# Patient Record
Sex: Male | Born: 1990 | Race: Black or African American | Hispanic: No | Marital: Married | Smoking: Current every day smoker
Health system: Southern US, Community
[De-identification: ages and names within clinical notes are randomized; demographics above are authoritative.]

## PROBLEM LIST (undated history)

## (undated) DIAGNOSIS — F319 Bipolar disorder, unspecified: Secondary | ICD-10-CM

## (undated) DIAGNOSIS — F209 Schizophrenia, unspecified: Secondary | ICD-10-CM

## (undated) DIAGNOSIS — F419 Anxiety disorder, unspecified: Secondary | ICD-10-CM

---

## 2014-06-25 ENCOUNTER — Emergency Department (HOSPITAL_COMMUNITY)
Admission: EM | Admit: 2014-06-25 | Discharge: 2014-06-25 | Disposition: A | Discharge status: Home / Self Care | Payer: Self-pay | Attending: Emergency Medicine | Admitting: Emergency Medicine

## 2014-06-25 ENCOUNTER — Encounter (HOSPITAL_COMMUNITY): Payer: Self-pay | Admitting: Emergency Medicine

## 2014-06-25 DIAGNOSIS — Z720 Tobacco use: Secondary | ICD-10-CM | POA: Insufficient documentation

## 2014-06-25 DIAGNOSIS — Y9289 Other specified places as the place of occurrence of the external cause: Secondary | ICD-10-CM | POA: Insufficient documentation

## 2014-06-25 DIAGNOSIS — Z880 Allergy status to penicillin: Secondary | ICD-10-CM | POA: Insufficient documentation

## 2014-06-25 DIAGNOSIS — Y990 Civilian activity done for income or pay: Secondary | ICD-10-CM | POA: Insufficient documentation

## 2014-06-25 DIAGNOSIS — Z79899 Other long term (current) drug therapy: Secondary | ICD-10-CM | POA: Insufficient documentation

## 2014-06-25 DIAGNOSIS — Y9389 Activity, other specified: Secondary | ICD-10-CM | POA: Insufficient documentation

## 2014-06-25 DIAGNOSIS — Z8659 Personal history of other mental and behavioral disorders: Secondary | ICD-10-CM | POA: Insufficient documentation

## 2014-06-25 DIAGNOSIS — X58XXXA Exposure to other specified factors, initial encounter: Secondary | ICD-10-CM | POA: Insufficient documentation

## 2014-06-25 DIAGNOSIS — S299XXA Unspecified injury of thorax, initial encounter: Secondary | ICD-10-CM | POA: Insufficient documentation

## 2014-06-25 DIAGNOSIS — M546 Pain in thoracic spine: Secondary | ICD-10-CM

## 2014-06-25 HISTORY — DX: Bipolar disorder, unspecified: F31.9

## 2014-06-25 HISTORY — DX: Anxiety disorder, unspecified: F41.9

## 2014-06-25 HISTORY — DX: Schizophrenia, unspecified: F20.9

## 2014-06-25 MED ORDER — HYDROCODONE-ACETAMINOPHEN 5-325 MG PO TABS: 1.0000 | ORAL_TABLET | ORAL | Status: DC | PRN

## 2014-06-25 MED ORDER — KETOROLAC TROMETHAMINE 60 MG/2ML IM SOLN
60.0000 mg | Freq: Once | INTRAMUSCULAR | Status: AC
  Administered 2014-06-25: 60 mg via INTRAMUSCULAR
  Filled 2014-06-25: qty 2

## 2014-06-25 MED ORDER — CYCLOBENZAPRINE HCL 10 MG PO TABS: 10.0000 mg | ORAL_TABLET | Freq: Two times a day (BID) | ORAL | Status: DC | PRN

## 2014-06-25 MED ORDER — PREDNISONE 10 MG PO TABS: 20.0000 mg | ORAL_TABLET | Freq: Every day | ORAL | Status: DC

## 2014-06-25 MED ORDER — CYCLOBENZAPRINE HCL 10 MG PO TABS
5.0000 mg | ORAL_TABLET | Freq: Once | ORAL | Status: AC
  Administered 2014-06-25: 5 mg via ORAL
  Filled 2014-06-25: qty 1

## 2014-06-25 MED ORDER — OXYCODONE-ACETAMINOPHEN 5-325 MG PO TABS
1.0000 | ORAL_TABLET | Freq: Once | ORAL | Status: AC
  Administered 2014-06-25: 1 via ORAL
  Filled 2014-06-25: qty 1

## 2014-06-25 NOTE — ED Notes (Signed)
Pt sts that he has muscle pain in his upper/middle back. Pain increases with movement and deep breathing. Pain is eased with pillow under his back. 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 No active distress in triage.

## 2014-06-25 NOTE — ED Provider Notes (Signed)
CSN: [id number]     Arrival date & time 06/25/14  1412 History  This chart was scribed for non-physician practitioner Marlon Pel, PA-C working with Tomasita Crumble, MD by Murriel Hopper, ED Scribe. This patient was seen in room WTR5/WTR5 and the patient's care was started at 4:10 PM.    Chief Complaint  Patient presents with  . Back Pain      The history is provided by the patient. No language interpreter was used.     HPI Comments: Howard Carlson is a 24 y.o. male who presents to the Emergency Department complaining of constant, worsening mid-thoracic back pain that has been present for 4.5 days. Pt states he injured his back moving heavy car parts at his job. Pt reports pain worsens with movement, reports difficulty getting out of bed, pain with bending over. Pt reports taking 800 mg Ibuprofen and trying a Percocet with no relief four days ago, reports using topical gels, heat and ice compress, and stretching with little relief. Denies any numbness or weakness to any of his 4 extremities. No chest pain, abdominal pain, SOB, or weakness.   Past Medical History  Diagnosis Date  . Anxiety   . Bipolar affective   . Schizophrenic disorder    History reviewed. No pertinent past surgical history. History reviewed. No pertinent family history. History  Substance Use Topics  . Smoking status: Light Tobacco Smoker    Types: Cigarettes  . Smokeless tobacco: Never Used  . Alcohol Use: No    Review of Systems  Musculoskeletal: Positive for myalgias and back pain.      Allergies  Amoxicillin  Home Medications   Prior to Admission medications   Medication Sig Start Date End Date Taking? Authorizing Provider  ibuprofen (ADVIL,MOTRIN) 200 MG tablet Take 400 mg by mouth every 6 (six) hours as needed for headache or moderate pain.   Yes Historical Provider, MD  oxycodone (OXY-IR) 5 MG capsule Take 5 mg by mouth once.   Yes Historical Provider, MD  cyclobenzaprine (FLEXERIL) 10 MG  tablet Take 1 tablet (10 mg total) by mouth 2 (two) times daily as needed for muscle spasms. 06/25/14   Marlon Pel, PA-C  HYDROcodone-acetaminophen (NORCO/VICODIN) 5-325 MG per tablet Take 1-2 tablets by mouth every 4 (four) hours as needed. 06/25/14   Shelia Magallon Neva Seat, PA-C  predniSONE (DELTASONE) 10 MG tablet Take 2 tablets (20 mg total) by mouth daily. 06/25/14   Zavannah Deblois Neva Seat, PA-C   BP 116/63 mmHg  Pulse 59  Temp(Src) 98.4 F (36.9 C) (Oral)  Resp 20  SpO2 97% Physical Exam  Constitutional: He is oriented to person, place, and time. He appears well-developed and well-nourished.  HENT:  Head: Normocephalic and atraumatic.  Cardiovascular: Normal rate.   Pulmonary/Chest: Effort normal.  Abdominal: He exhibits no distension.  Musculoskeletal:       Back:  Neurological: He is alert and oriented to person, place, and time.  Skin: Skin is warm and dry.  Psychiatric: He has a normal mood and affect.  Nursing note and vitals reviewed.   ED Course  Procedures (including critical care time)  DIAGNOSTIC STUDIES: Oxygen Saturation is 97% on room air, normal by my interpretation.    COORDINATION OF CARE: 4:15 PM Discussed treatment plan with pt at bedside and pt agreed to plan.   Labs Review Labs Reviewed - No data to display  Imaging Review No results found.   EKG Interpretation None      MDM   Final diagnoses:  Bilateral  thoracic back pain   Medications  cyclobenzaprine (FLEXERIL) tablet 5 mg (5 mg Oral Given 06/25/14 1639)  ketorolac (TORADOL) injection 60 mg (60 mg Intramuscular Given 06/25/14 1639)  oxyCODONE-acetaminophen (PERCOCET/ROXICET) 5-325 MG per tablet 1 tablet (1 tablet Oral Given 06/25/14 1639)    23 y.o.Howard Carlson's  with back pain. No neurological deficits and normal neuro exam. Patient can walk. No loss of bowel or bladder control. No concern for cauda equina at this time base on HPI and physical exam findings. No fever, night sweats, weight loss,  h/o cancer, IVDU.   Patient Plan 1. Medications: NSAIDs and muscle relaxer. Cont usual home medications unless otherwise directed. 2. Treatment: rest, drink plenty of fluids, gentle stretching as discussed, alternate ice and heat  3. Follow Up: Please followup with your primary doctor for discussion of your diagnoses and further evaluation after today's visit; if you do not have a primary care doctor use the resource guide provided to find one  Advised to follow-up with the orthopedist if symptoms do not start to resolve in the next 2-3 days. If develop loss of bowel or urinary control return to the ED as soon as possible for further evaluation. To take the medications as prescribed as they can cause harm if not taken appropriately.   Vital signs are stable at discharge. Filed Vitals:   06/25/14 1425  BP: 116/63  Pulse: 59  Temp: 98.4 F (36.9 C)  Resp: 20    Patient/guardian has voiced understanding and agreed to follow-up with the PCP or specialist.       I personally performed the services described in this documentation, which was scribed in my presence. The recorded information has been reviewed and is accurate.   Marlon Peliffany Gaige Sebo, PA-C 06/25/14 1640  Tomasita CrumbleAdeleke Oni, MD 06/26/14 2255

## 2014-06-25 NOTE — Discharge Instructions (Signed)
Back Pain, Adult °Low back pain is very common. About 1 in 5 people have back pain. The cause of low back pain is rarely dangerous. The pain often gets better over time. About half of people with a sudden onset of back pain feel better in just 2 weeks. About 8 in 10 people feel better by 6 weeks.  °CAUSES °Some common causes of back pain include: °· Strain of the muscles or ligaments supporting the spine. °· Wear and tear (degeneration) of the spinal discs. °· Arthritis. °· Direct injury to the back. °DIAGNOSIS °Most of the time, the direct cause of low back pain is not known. However, back pain can be treated effectively even when the exact cause of the pain is unknown. Answering your caregiver's questions about your overall health and symptoms is one of the most accurate ways to make sure the cause of your pain is not dangerous. If your caregiver needs more information, he or she may order lab work or imaging tests (X-rays or MRIs). However, even if imaging tests show changes in your back, this usually does not require surgery. °HOME CARE INSTRUCTIONS °For many people, back pain returns. Since low back pain is rarely dangerous, it is often a condition that people can learn to manage on their own.  °· Remain active. It is stressful on the back to sit or stand in one place. Do not sit, drive, or stand in one place for more than 30 minutes at a time. Take short walks on level surfaces as soon as pain allows. Try to increase the length of time you walk each day. °· Do not stay in bed. Resting more than 1 or 2 days can delay your recovery. °· Do not avoid exercise or work. Your body is made to move. It is not dangerous to be active, even though your back may hurt. Your back will likely heal faster if you return to being active before your pain is gone. °· Pay attention to your body when you  bend and lift. Many people have less discomfort when lifting if they bend their knees, keep the load close to their bodies, and  avoid twisting. Often, the most comfortable positions are those that put less stress on your recovering back. °· Find a comfortable position to sleep. Use a firm mattress and lie on your side with your knees slightly bent. If you lie on your back, put a pillow under your knees. °· Only take over-the-counter or prescription medicines as directed by your caregiver. Over-the-counter medicines to reduce pain and inflammation are often the most helpful. Your caregiver may prescribe muscle relaxant drugs. These medicines help dull your pain so you can more quickly return to your normal activities and healthy exercise. °· Put ice on the injured area. °¨ Put ice in a plastic bag. °¨ Place a towel between your skin and the bag. °¨ Leave the ice on for 15-20 minutes, 03-04 times a day for the first 2 to 3 days. After that, ice and heat may be alternated to reduce pain and spasms. °· Ask your caregiver about trying back exercises and gentle massage. This may be of some benefit. °· Avoid feeling anxious or stressed. Stress increases muscle tension and can worsen back pain. It is important to recognize when you are anxious or stressed and learn ways to manage it. Exercise is a great option. °SEEK MEDICAL CARE IF: °· You have pain that is not relieved with rest or medicine. °· You have pain that does not improve in 1 week. °· You have new symptoms. °· You are generally not feeling well. °SEEK   IMMEDIATE MEDICAL CARE IF:  °· You have pain that radiates from your back into your legs. °· You develop new bowel or bladder control problems. °· You have unusual weakness or numbness in your arms or legs. °· You develop nausea or vomiting. °· You develop abdominal pain. °· You feel faint. °Document Released: 01/10/2005 Document Revised: 07/12/2011 Document Reviewed: 05/14/2013 °ExitCare® Patient Information ©2015 [employer]. This information is not intended to replace advice given to you by your health care provider. Make sure you  discuss any questions you have with your health care provider. ° °Thoracic Strain °You have injured the muscles or tendons that attach to the upper part of your back behind your chest. This injury is called a thoracic strain, thoracic sprain, or mid-back strain.  °CAUSES  °The cause of thoracic strain varies. A less severe injury involves pulling a muscle or tendon without tearing it. A more severe injury involves tearing (rupturing) a muscle or tendon. With less severe injuries, there may be little loss of strength. Sometimes, there are breaks (fractures) in the bones to which the muscles are attached. These fractures are rare, unless there was a direct hit (trauma) or you have weak bones due to osteoporosis or age. Longstanding strains may be caused by overuse or improper form during certain movements. Obesity can also increase your risk for back injuries. Sudden strains may occur due to injury or not warming up properly before exercise. Often, there is no obvious cause for a thoracic strain. °SYMPTOMS  °The main symptom is pain, especially with movement, such as during exercise. °DIAGNOSIS  °Your caregiver can usually tell what is wrong by taking an X-ray and doing a physical exam. °TREATMENT  °· Physical therapy may be helpful for recovery. Your caregiver can give you exercises to do or refer you to a physical therapist after your pain improves. °· After your pain improves, strengthening and conditioning programs appropriate for your sport or occupation may be helpful. °· Always warm up before physical activities or athletics. Stretching after physical activity may also help. °· Certain over-the-counter medicines may also help. Ask your caregiver if there are medicines that would help you. °If this is your first thoracic strain injury, proper care and proper healing time before starting activities should prevent long-term problems. Torn ligaments and tendons require as long to heal as broken bones. Average  healing times may be only 1 week for a mild strain. For torn muscles and tendons, healing time may be up to 6 weeks to 2 months. °HOME CARE INSTRUCTIONS  °· Apply ice to the injured area. Ice massages may also be used as directed. °¨ Put ice in a plastic bag. °¨ Place a towel between your skin and the bag. °¨ Leave the ice on for 15-20 minutes, 03-04 times a day, for the first 2 days. °· Only take over-the-counter or prescription medicines for pain, discomfort, or fever as directed by your caregiver. °· Keep your appointments for physical therapy if this was prescribed. °· Use wraps and back braces as instructed. °SEEK IMMEDIATE MEDICAL CARE IF:  °· You have an increase in bruising, swelling, or pain. °· Your pain has not improved with medicines. °· You develop new shortness of breath, chest pain, or fever. °· Problems seem to be getting worse rather than better. °MAKE SURE YOU:  °· Understand these instructions. °· Will watch your condition. °· Will get help right away if you are not doing well or get worse. °Document Released: 04/02/2003 Document Revised:   04/04/2011 Document Reviewed: 02/26/2010 °ExitCare® Patient Information ©2015 [employer]. This information is not intended to replace advice given to you by your health care provider. Make sure you discuss any questions you have with your health care provider. ° °

## 2014-09-21 ENCOUNTER — Encounter (HOSPITAL_COMMUNITY): Payer: Self-pay

## 2014-09-21 ENCOUNTER — Emergency Department (HOSPITAL_COMMUNITY)
Admission: EM | Admit: 2014-09-21 | Discharge: 2014-09-22 | Disposition: A | Discharge status: Home / Self Care | Payer: Self-pay | Attending: Emergency Medicine | Admitting: Emergency Medicine

## 2014-09-21 DIAGNOSIS — Z880 Allergy status to penicillin: Secondary | ICD-10-CM | POA: Insufficient documentation

## 2014-09-21 DIAGNOSIS — F419 Anxiety disorder, unspecified: Secondary | ICD-10-CM | POA: Insufficient documentation

## 2014-09-21 DIAGNOSIS — F1210 Cannabis abuse, uncomplicated: Secondary | ICD-10-CM | POA: Insufficient documentation

## 2014-09-21 DIAGNOSIS — F4321 Adjustment disorder with depressed mood: Secondary | ICD-10-CM | POA: Diagnosis present

## 2014-09-21 DIAGNOSIS — F1410 Cocaine abuse, uncomplicated: Secondary | ICD-10-CM | POA: Diagnosis present

## 2014-09-21 DIAGNOSIS — Z720 Tobacco use: Secondary | ICD-10-CM | POA: Insufficient documentation

## 2014-09-21 LAB — COMPREHENSIVE METABOLIC PANEL
ALT: 32 U/L (ref 17–63)
AST: 31 U/L (ref 15–41)
Albumin: 4.4 g/dL (ref 3.5–5.0)
Alkaline Phosphatase: 55 U/L (ref 38–126)
Anion gap: 8 (ref 5–15)
BILIRUBIN TOTAL: 0.6 mg/dL (ref 0.3–1.2)
BUN: 10 mg/dL (ref 6–20)
CALCIUM: 9.3 mg/dL (ref 8.9–10.3)
CO2: 25 mmol/L (ref 22–32)
Chloride: 103 mmol/L (ref 101–111)
Creatinine, Ser: 1.16 mg/dL (ref 0.61–1.24)
GFR calc Af Amer: >60 mL/min (ref >60)
GFR calc non Af Amer: >60 mL/min (ref >60)
GLUCOSE: 119 mg/dL — AB (ref 65–99)
Potassium: 4.1 mmol/L (ref 3.5–5.1)
Sodium: 136 mmol/L (ref 135–145)
TOTAL PROTEIN: 8 g/dL (ref 6.5–8.1)

## 2014-09-21 LAB — RAPID URINE DRUG SCREEN, HOSP PERFORMED
Amphetamines: NOT DETECTED
BARBITURATES: NOT DETECTED
BENZODIAZEPINES: NOT DETECTED
Cocaine: POSITIVE — AB
Opiates: NOT DETECTED
Tetrahydrocannabinol: POSITIVE — AB

## 2014-09-21 LAB — CBC
HEMATOCRIT: 38.3 % — AB (ref 39.0–52.0)
Hemoglobin: 12.8 g/dL — ABNORMAL LOW (ref 13.0–17.0)
MCH: 28.6 pg (ref 26.0–34.0)
MCHC: 33.4 g/dL (ref 30.0–36.0)
MCV: 85.7 fL (ref 78.0–100.0)
Platelets: 193 10*3/uL (ref 150–400)
RBC: 4.47 MIL/uL (ref 4.22–5.81)
RDW: 13.9 % (ref 11.5–15.5)
WBC: 8.7 10*3/uL (ref 4.0–10.5)

## 2014-09-21 LAB — ETHANOL: Alcohol, Ethyl (B): <5 mg/dL (ref <5)

## 2014-09-21 LAB — ACETAMINOPHEN LEVEL: Acetaminophen (Tylenol), Serum: <10 ug/mL — ABNORMAL LOW (ref 10–30)

## 2014-09-21 LAB — SALICYLATE LEVEL: Salicylate Lvl: <4 mg/dL (ref 2.8–30.0)

## 2014-09-21 MED ORDER — NICOTINE 21 MG/24HR TD PT24: 21.0000 mg | MEDICATED_PATCH | Freq: Every day | TRANSDERMAL | Status: DC

## 2014-09-21 MED ORDER — IBUPROFEN 200 MG PO TABS: 600.0000 mg | ORAL_TABLET | Freq: Three times a day (TID) | ORAL | Status: DC | PRN

## 2014-09-21 MED ORDER — ONDANSETRON HCL 4 MG PO TABS
4.0000 mg | ORAL_TABLET | Freq: Three times a day (TID) | ORAL | Status: DC | PRN
  Administered 2014-09-21: 4 mg via ORAL

## 2014-09-21 MED ORDER — ALUM & MAG HYDROXIDE-SIMETH 200-200-20 MG/5ML PO SUSP: 30.0000 mL | ORAL | Status: DC | PRN

## 2014-09-21 MED ORDER — ZOLPIDEM TARTRATE 5 MG PO TABS: 5.0000 mg | ORAL_TABLET | Freq: Every evening | ORAL | Status: DC | PRN

## 2014-09-21 MED ORDER — ACETAMINOPHEN 325 MG PO TABS: 650.0000 mg | ORAL_TABLET | ORAL | Status: DC | PRN

## 2014-09-21 NOTE — Progress Notes (Addendum)
Pt appears very depressed and blunted. He stated,"I work and my wife  stays home. She is [redacted] weeks pregnant and just told me she wants a divorce." "I am not sure what is going on here." "She did come to the hospital and apologize to me but I do not know what to make of all this." Pt did not want to eat stating,"I do not have an appetite." Pt does contract for safety. Pt remains safe with a sitter. Pt does stutter on occasion when talking to the writer. He does have good eye contact. Pt stated ,"we are having a boy." Wife phoned at 1: 30pm to speak with the pt. 2p-pt remains on the phone talking to his wife. He did request a ham sandwich and a gingerale. Pt does appear a little brighter in his affect since talking to his wife.

## 2014-09-21 NOTE — ED Notes (Signed)
Tele psych being conducted. Will get vital signs when telepsych is finished.

## 2014-09-21 NOTE — Progress Notes (Signed)
Consulted with Nanine Means, NP who states that collateral information is needed from patient's wife in order to determine plan of disposition.   Janann Colonel. MSW, LCSW Therapeutic Triage Services-Triage Specialist   Phone: [phone]

## 2014-09-21 NOTE — ED Notes (Signed)
Pt states wife has been wanting a divorce and discussion escalated last night.  Pt is stating he is extremely depressed.  Cannot answer if he is suicidal.  Pt very hesitant with answer on assessment.   States he has had thoughts to hurt self in past.  Pt states he used multiple recreational drugs last night d/t his depression.  Denies oral drugs to kill self. Stopped drugs this morning at around 2 am and states he didn't want to come at that time.

## 2014-09-21 NOTE — ED Notes (Signed)
pts mother Wallie Char [phone]
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
## 2014-09-21 NOTE — Progress Notes (Signed)
Counselor telephoned patient's wife [name] [phone]) to receive collateral information in regard to patient's presenting issues upon admission. Patient's wife did not answer. Counselor to re-attempt again.    Janann Colonel. MSW, LCSW Therapeutic Triage Services-Triage Specialist   Phone: [phone]

## 2014-09-21 NOTE — Progress Notes (Signed)
Received report from RN. Awaiting patient to be moved to initiate assessment.

## 2014-09-21 NOTE — ED Notes (Signed)
Pt is awake and alert, has complaints of nausea. Reports N/V started 6 hours ago. Will continue to monitor for safety.

## 2014-09-21 NOTE — ED Provider Notes (Signed)
CSN: [id number]     Arrival date & time 09/21/14  0940 History   First MD Initiated Contact with Patient 09/21/14 1041     Chief Complaint  Patient presents with  . Depression     (Consider location/radiation/quality/duration/timing/severity/associated sxs/prior Treatment) HPI Howard Carlson is a 24 y.o. male with hx of anxiety, bipolr disorder, schizophrenia, presents to ED with complaint of depression and anxiety. Patient states he had an argument with his wife last night. He states this escalated his symptoms. He reports severe depression, states intermittently has thoughts about hurting himself. Patient does not have a plan. He admits to occasional alcohol and polysubstance abuse, but states he uses and drinks only when anxious and depressed. Patient states he took himself off of his psychiatric medications approximately 3 months ago. He denies any other medical complaints. He denies any homicidal ideations.  Past Medical History  Diagnosis Date  . Anxiety   . Bipolar affective   . Schizophrenic disorder    History reviewed. No pertinent past surgical history. History reviewed. No pertinent family history. Social History  Substance Use Topics  . Smoking status: Light Tobacco Smoker    Types: Cigarettes  . Smokeless tobacco: Never Used  . Alcohol Use: No    Review of Systems  Constitutional: Negative for fever and chills.  Respiratory: Negative for cough, chest tightness and shortness of breath.   Cardiovascular: Negative for chest pain, palpitations and leg swelling.  Skin: Negative for rash.  Allergic/Immunologic: Negative for immunocompromised state.  Neurological: Negative for dizziness, weakness, light-headedness, numbness and headaches.  Psychiatric/Behavioral: Positive for suicidal ideas. The patient is nervous/anxious.   All other systems reviewed and are negative.     Allergies  Amoxicillin  Home Medications   Prior to Admission medications   Medication  Sig Start Date End Date Taking? Authorizing Provider  cyclobenzaprine (FLEXERIL) 10 MG tablet Take 1 tablet (10 mg total) by mouth 2 (two) times daily as needed for muscle spasms. Patient not taking: Reported on 09/21/2014 06/25/14   Marlon Pel, PA-C  HYDROcodone-acetaminophen (NORCO/VICODIN) 5-325 MG per tablet Take 1-2 tablets by mouth every 4 (four) hours as needed. Patient not taking: Reported on 09/21/2014 06/25/14   Marlon Pel, PA-C  predniSONE (DELTASONE) 10 MG tablet Take 2 tablets (20 mg total) by mouth daily. Patient not taking: Reported on 09/21/2014 06/25/14   Marlon Pel, PA-C   BP 156/74 mmHg  Pulse 108  Temp(Src) 98.6 F (37 C) (Oral)  Resp 16  SpO2 98% Physical Exam  Constitutional: He is oriented to person, place, and time. He appears well-developed and well-nourished. No distress.  HENT:  Head: Normocephalic and atraumatic.  Eyes: Conjunctivae are normal.  Neck: Neck supple.  Cardiovascular: Normal rate, regular rhythm and normal heart sounds.   Pulmonary/Chest: Effort normal. No respiratory distress. He has no wheezes. He has no rales.  Musculoskeletal: He exhibits no edema.  Neurological: He is alert and oriented to person, place, and time.  Skin: Skin is warm and dry.  Psychiatric:  Flat affect. Suicidal ideations  Nursing note and vitals reviewed.   ED Course  Procedures (including critical care time) Labs Review Labs Reviewed  COMPREHENSIVE METABOLIC PANEL - Abnormal; Notable for the following:    Glucose, Bld 119 (*)    All other components within normal limits  ACETAMINOPHEN LEVEL - Abnormal; Notable for the following:    Acetaminophen (Tylenol), Serum <10 (*)    All other components within normal limits  CBC - Abnormal; Notable for the  following:    Hemoglobin 12.8 (*)    HCT 38.3 (*)    All other components within normal limits  ETHANOL  SALICYLATE LEVEL  URINE RAPID DRUG SCREEN, HOSP PERFORMED    Imaging Review No results found. I have  personally reviewed and evaluated these images and lab results as part of my medical decision-making.   EKG Interpretation None      MDM   Final diagnoses:  Adjustment disorder with depressed mood  Cocaine abuse    Patient emergency department with depression, anxiety, intermittent suicidal ideations, polysubstance abuse. Patient has very flat affect. He has been off of his medications for several months. We'll get TTS assessment for possible inpatient treatment. Patient is medically cleared.  Jaynie Crumble, PA-C 09/22/14 1619  Linwood Dibbles, MD 09/23/14 [phone]

## 2014-09-21 NOTE — ED Notes (Signed)
Pt alert and oriented x4. Respirations even and unlabored, bilateral symmetrical rise and fall of chest. Skin warm and dry. In no acute distress. Denies needs.   

## 2014-09-21 NOTE — ED Notes (Signed)
1 pt belonging bag in locker #28 
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
## 2014-09-22 DIAGNOSIS — F4321 Adjustment disorder with depressed mood: Secondary | ICD-10-CM | POA: Diagnosis present

## 2014-09-22 DIAGNOSIS — F1410 Cocaine abuse, uncomplicated: Secondary | ICD-10-CM | POA: Diagnosis present

## 2014-09-22 NOTE — BH Assessment (Signed)
B[facility] Assessment Progress Note  Per Thedore Mins, MD, this pt does not require psychiatric hospitalization at this time.  Pt is to be discharged from [facility] with outpatient referrals.  Discharge instructions include referral information for [facility].  Pt's nurse has been notified.  Doylene Canning, MA Triage Specialist [phone]
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
## 2014-09-22 NOTE — Discharge Instructions (Signed)
For your ongoing mental health needs, you are advised to follow up with [facility].  New and returning patients are seen at their walk-in clinic.  Walk-in hours are Monday - Friday from 8:00 am - 3:00 pm.  Walk-in patients are seen on a first come, first served basis.  Try to arrive as early as possible for he best chance of being seen the same day: ° °     [facility] °     [address] °     [place], [place] [address] °     [phone] °
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
## 2014-09-22 NOTE — Progress Notes (Addendum)
Pt appears very bright this am. He did eat 100% of his breakfast ans stated he slept good last pm. Pt does contract for safety . He presently does have a Comptroller. Pts wife has called times two to speak to him. Pt initally was asleep but when he awakened he did not want to take the call. (9:30a)

## 2014-11-21 ENCOUNTER — Emergency Department (HOSPITAL_COMMUNITY)
Admission: EM | Admit: 2014-11-21 | Discharge: 2014-11-22 | Disposition: A | Discharge status: Home / Self Care | Payer: Self-pay | Attending: Emergency Medicine | Admitting: Emergency Medicine

## 2014-11-21 ENCOUNTER — Encounter (HOSPITAL_COMMUNITY): Payer: Self-pay | Admitting: Emergency Medicine

## 2014-11-21 ENCOUNTER — Encounter (HOSPITAL_COMMUNITY): Payer: Self-pay | Admitting: *Deleted

## 2014-11-21 ENCOUNTER — Emergency Department (HOSPITAL_COMMUNITY)
Admission: EM | Admit: 2014-11-21 | Discharge: 2014-11-21 | Discharge status: Against Medical Advice | Payer: Self-pay | Attending: Emergency Medicine | Admitting: Emergency Medicine

## 2014-11-21 ENCOUNTER — Emergency Department (HOSPITAL_COMMUNITY): Payer: Self-pay

## 2014-11-21 DIAGNOSIS — R0789 Other chest pain: Secondary | ICD-10-CM | POA: Insufficient documentation

## 2014-11-21 DIAGNOSIS — R1084 Generalized abdominal pain: Secondary | ICD-10-CM | POA: Insufficient documentation

## 2014-11-21 DIAGNOSIS — Z720 Tobacco use: Secondary | ICD-10-CM | POA: Insufficient documentation

## 2014-11-21 DIAGNOSIS — Z880 Allergy status to penicillin: Secondary | ICD-10-CM | POA: Insufficient documentation

## 2014-11-21 DIAGNOSIS — R109 Unspecified abdominal pain: Secondary | ICD-10-CM | POA: Insufficient documentation

## 2014-11-21 DIAGNOSIS — F1210 Cannabis abuse, uncomplicated: Secondary | ICD-10-CM | POA: Insufficient documentation

## 2014-11-21 DIAGNOSIS — Z8659 Personal history of other mental and behavioral disorders: Secondary | ICD-10-CM | POA: Insufficient documentation

## 2014-11-21 DIAGNOSIS — M25519 Pain in unspecified shoulder: Secondary | ICD-10-CM | POA: Insufficient documentation

## 2014-11-21 LAB — URINALYSIS, ROUTINE W REFLEX MICROSCOPIC
BILIRUBIN URINE: NEGATIVE
Glucose, UA: NEGATIVE mg/dL
HGB URINE DIPSTICK: NEGATIVE
Ketones, ur: NEGATIVE mg/dL
Nitrite: NEGATIVE
PROTEIN: NEGATIVE mg/dL
Specific Gravity, Urine: 1.023 (ref 1.005–1.030)
UROBILINOGEN UA: 0.2 mg/dL (ref 0.0–1.0)
pH: 5.5 (ref 5.0–8.0)

## 2014-11-21 LAB — URINE MICROSCOPIC-ADD ON

## 2014-11-21 LAB — RAPID URINE DRUG SCREEN, HOSP PERFORMED
AMPHETAMINES: NOT DETECTED
Barbiturates: NOT DETECTED
Benzodiazepines: NOT DETECTED
Cocaine: NOT DETECTED
Opiates: NOT DETECTED
Tetrahydrocannabinol: POSITIVE — AB

## 2014-11-21 MED ORDER — HYDROCODONE-ACETAMINOPHEN 5-325 MG PO TABS: 1.0000 | ORAL_TABLET | ORAL | Status: DC | PRN

## 2014-11-21 NOTE — ED Notes (Signed)
Patient came up to me in waiting room, asked how long the wait was because he wanted to go to [facility]. Told patient we should get him back soon, and advised him to wait.

## 2014-11-21 NOTE — ED Provider Notes (Signed)
Blood pressure 131/60, pulse 87, temperature 98.7 F (37.1 C), temperature source Oral, resp. rate 18, SpO2 98 %.  Esaw DaceDeontae Senaida OresRichardson is a 24 y.o. male presenting to the ED for evaluation. Patient left without being seen after triage, I did not participate in the care of this patient.  Wynetta Emeryicole Pius Byrom, PA-C 11/21/14 1754  Mancel BaleElliott Wentz, MD 11/21/14 2322

## 2014-11-21 NOTE — ED Notes (Signed)
Second call for patient.

## 2014-11-21 NOTE — ED Notes (Signed)
Pulled name back to room in chart, called for patient, no answer, will try again shortly

## 2014-11-21 NOTE — ED Notes (Signed)
Final call made for patient, no answer in lobby.

## 2014-11-21 NOTE — ED Notes (Signed)
Bed: WA14 Expected date:  Expected time:  Means of arrival:  Comments: Triage 2  

## 2014-11-21 NOTE — Discharge Instructions (Signed)
Use heat on the sore area 3 or 4 times a day. Follow-up with the doctor of your choice if not better in 1 week.   Flank Pain Flank pain refers to pain that is located on the side of the body between the upper abdomen and the back. The pain may occur over a short period of time (acute) or may be long-term or reoccurring (chronic). It may be mild or severe. Flank pain can be caused by many things. CAUSES  Some of the more common causes of flank pain include:  Muscle strains.   Muscle spasms.   A disease of your spine (vertebral disk disease).   A lung infection (pneumonia).   Fluid around your lungs (pulmonary edema).   A kidney infection.   Kidney stones.   A very painful skin rash caused by the chickenpox virus (shingles).   Gallbladder disease.  HOME CARE INSTRUCTIONS  Home care will depend on the cause of your pain. In general,  Rest as directed by your caregiver.  Drink enough fluids to keep your urine clear or pale yellow.  Only take over-the-counter or prescription medicines as directed by your caregiver. Some medicines may help relieve the pain.  Tell your caregiver about any changes in your pain.  Follow up with your caregiver as directed. SEEK IMMEDIATE MEDICAL CARE IF:   Your pain is not controlled with medicine.   You have new or worsening symptoms.  Your pain increases.   You have abdominal pain.   You have shortness of breath.   You have persistent nausea or vomiting.   You have swelling in your abdomen.   You feel faint or pass out.   You have blood in your urine.  You have a fever or persistent symptoms for more than 2-3 days.  You have a fever and your symptoms suddenly get worse. MAKE SURE YOU:   Understand these instructions.  Will watch your condition.  Will get help right away if you are not doing well or get worse.   This information is not intended to replace advice given to you by your health care provider.  Make sure you discuss any questions you have with your health care provider.   Document Released: 03/03/2005 Document Revised: 10/05/2011 Document Reviewed: 08/25/2011 Elsevier Interactive Patient Education 2016 [employer].    Emergency Department Resource Guide 1) Find a Doctor and Pay Out of Pocket Although you won't have to find out who is covered by your insurance plan, it is a good idea to ask around and get recommendations. You will then need to call the office and see if the doctor you have chosen will accept you as a new patient and what types of options they offer for patients who are self-pay. Some doctors offer discounts or will set up payment plans for their patients who do not have insurance, but you will need to ask so you aren't surprised when you get to your appointment.  2) Contact Your Local Health Department Not all health departments have doctors that can see patients for sick visits, but many do, so it is worth a call to see if yours does. If you don't know where your local health department is, you can check in your phone book. The CDC also has a tool to help you locate your state's health department, and many state websites also have listings of all of their local health departments.  3) Find a Walk-in Clinic If your illness is not likely to be  very severe or complicated, you may want to try a walk in clinic. These are popping up all over the country in pharmacies, drugstores, and shopping centers. They're usually staffed by nurse practitioners or physician assistants that have been trained to treat common illnesses and complaints. They're usually fairly quick and inexpensive. However, if you have serious medical issues or chronic medical problems, these are probably not your best option.  No Primary Care Doctor: - Call Health Connect at  [phone] - they can help you locate a primary care doctor that  accepts your insurance, provides certain services, etc. - Physician  Referral Service- [phone]  Chronic Pain Problems: Organization         Address  Phone   Notes  [facility]  [phone] Patients need to be referred by their primary care doctor.   Medication Assistance: Organization         Address  Phone   Notes  [facility] Med[facility] [address] [place] [place], [place] [address] [phone] --Must be a resident of [facility] -- Must have NO insurance coverage whatsoever (no Medicaid/ Medicare, etc.) -- The pt. MUST have a primary care doctor that directs their care regularly and follows them in the community   [facility]ist  [phone]   [employer]  [phone]    Agencies that provide inexpensive medical care: Organization         Address  Phone   Notes  [facility]  [phone]   [facility]    [phone]   [facility] [address] [place], [place] [address] [phone]   [facility] [address], [place] [phone]   Planned Parenthood    [phone]   [facility]    [phone]   [facility]  [address], [place] Phone:  [phone], Fax:  [phone] Hours of Operation:  9 am - 6 pm, M-F.  Also accepts Medicaid/Medicare and self-pay.  [facility]  [address], [place] Phone: [phone], Fax: [phone]. Hours of Operation:  8:30 am - 5:30 pm, M-F.  Also accepts Medicaid and self-pay.  [facility] [place] [address], [place] Phone: [phone]   [facility] [address] Natasha Bence [place], [place] [phone], Ext. [phone] Mondays & Thursdays: 7-9 AM.  First 15 patients are seen on a first come, first serve basis.    Medicaid-accepting [facility] Providers:  Organization         Address  Phone   Notes  [facility] [address],  [phone] Also accepts self-pay patients.  [facility] [address] Laurell Josephs [place], [place]  [phone]   [facility] [address], [place] [phone]   [facility] [address], [place] [phone]   Renaye Rakers [address], [place]   [phone] Only accepts [place] patients after they have their name applied to their card.   Self-Pay (no insurance) in [facility]:  Organization         Address  Phone   Notes  Sickle Cell Patients, [facility] [place] [facility] [address] [place], [place] [phone]   [facility]  [address], [place] [phone]) [phone]   [facility]  [address], [place] [phone]) [phone]   [facility]/Dr. Osei-Bonsu  [address] [place] [address], [place] or [address], [place] [phone]) [phone] Phone number for both [place] and [place] locations is the same.  [facility] [address], [place] [phone]) [phone]   [facility] [address] [place] [address], [place] or [address] [phone]   [facility] [address], [place] [phone]) [phone], phone; [phone]) [phone], fax Sees patients 1st and 3rd Saturday of every month.  Must not qualify for public or private insurance (i.e. Medicaid, Medicare, [place] Health Choice, Veterans' Benefits)  Household income should be no more than 200% of the poverty level The clinic cannot treat you if you are pregnant or think you are pregnant  Sexually transmitted diseases are not treated at the clinic.    Dental Care: Organization         Address  Phone  Notes  [facility] [address], [place] [phone]) [phone] Accepts children up to age 26 who are enrolled  in [place] or [place] Health Choice; pregnant women with a Medicaid card; and children who have applied for Medicaid or [place] Health Choice, but were declined, whose parents can pay a reduced fee at time of service.  [facility] [place]  [address], [place] [phone] Accepts children up to age 26 who are enrolled in [place] or [place] Health Choice; pregnant women with a Medicaid card; and children who have applied for Medicaid or  Health Choice, but were declined, whose parents can pay a reduced fee at time of service.  [facility]  [address], [place] [phone]) [phone] Patients are seen by appointment only. Walk-ins are not accepted. [facility] will see patients 26 years of age and older. Monday - Tuesday (8am-5pm) Most Wednesdays (8:30-5pm) $30 per visit, cash only  [facility]  [address], [facility] [phone]) [phone] Patients are seen by appointment only. Walk-ins are not accepted. [facility] will see patients 26 years of age and older. One Wednesday Evening (Monthly: Volunteer Based).  $30 per visit, cash only  [facility]  [phone]) [phone] for adults; Children under age 684, call Graduate Pediatric Dentistry at [phone]. Children aged 264-14, please call [phone]) [phone] to request a pediatric application.  Dental services are provided in all areas of dental care including fillings, crowns and bridges, complete and partial dentures, implants, gum treatment, root canals, and extractions. Preventive care is also provided. Treatment is provided to both adults and children. Patients are selected via a lottery and there is often a waiting list.   [facility] [address], [place]  [phone]) [phone] [email or web address]   [facility] [address], [place], [place] [phone], Ext. [phone] Second and Fourth Thursday of each month, opens at  6:30 AM; Clinic ends at 9 AM.  Patients are seen on a first-come first-served basis, and a limited number are seen during each clinic.   [facility]  [address], [place], [place] [phone]) [phone]   Eligibility Requirements You must have lived in [place], [place], or [place] counties for at least the last three months.  You cannot be eligible for state or federal sponsored [place], including [employer], [place], or [employer].   You generally cannot be eligible for healthcare insurance through your employer.    How to apply: Eligibility screenings are held every Tuesday and Wednesday afternoon from 1:00 pm until 4:00 pm. You do not need an appointment for the interview!  [facility] [address] [place], [place] [phone]   [facility] Health Department  [phone]   [facility] Health Department  [phone]   [facility] Health Department  [phone]    Behavioral Health Resources in the Community: Intensive Outpatient Programs Organization         Address  Phone  Notes  [facility] [address] [place], [place] [phone]   [facility] Outpatient [address] [place], [place] [phone]   [facility] [address], [place], [place]  [phone]   [facility] Mental Health [address]  [place], [place] [phone] or [phone]   Substance Abuse Resources Organization         Address  Phone  Notes  [facility] Drug Services  [phone]   [facility]  [phone]   [facility] [place]  [phone]   Floydene Flock  [phone]   Residential & Outpatient Substance Abuse Program  [phone]   Psychological Services Organization         Address  Phone  Notes  [facility] Behavioral Health  [phone]   [facility] Services  [phone]   [facility] Mental Health [address] [place]  [phone] or [phone]    Mobile Crisis Teams Organization         Address  Phone  Notes  [facility], Mobile Crisis Care Unit  [phone]   [facility]  [address] [place], [place] [phone]   Doristine Locks [address] [place] [phone]    Self-Help/Support Groups Organization         Address  Phone             Notes  [facility] [place] - variety of support groups  [phone]- [phone] Call for more information  Narcotics Anonymous (NA), [facility] [address], [employer] [place]  2 meetings at this location   Statistician         Address  Phone  Notes  [facility] [address],    [place]  [phone]   [facility]  [address] [place] [address], [name], [place] [phone]   [facility] [address] [place], [place] [phone] Admissions: 8am-3pm M-F  [facility] [address],    [place], [place] [phone]   [facility] [address] [place], [place], [place] [phone]   [facility] [address]  [place], [place] [phone]   [facility] - Intensive Outpatient [address] Laurell Josephs [address], [place], [place] [phone]   [facility] [address]  [place], [place] [phone] or [phone]   [facility] ([facility]) [address], [place], [place] [phone] Accepts Medicaid  [facility] [address]  [place] [phone] Substance Abuse/Addiction Treatment   [facility] Organization         Address  Phone  Notes  [facility]  [phone]   Angie Fava, PhD [address],  [place]uanne Moron, [place]   [phone] or [phone]   [name]   [address] [place], [place] [phone]   [facility]  [address] [place], [place] [phone] Insurance/Medicaid/sponsorship through [facility] and Families [address]                                    [place], [place] [phone] Therapy/tele-psych/case  [facility] [address], [place] [phone]    Dr. Lolly Mustache  [phone]   [facility] [place]  [employer] [facility] Dept. 1) [address], [place] 2) [address] [place] 3)  [address], [place] [phone]  [phone]   [facility] Child Abuse Hotline (704)440-0711 or [phone] (After Hours)

## 2014-11-21 NOTE — ED Provider Notes (Signed)
CSN: [id number]     Arrival date & time 11/21/14  1853 History   First MD Initiated Contact with Patient 11/21/14 2022     Chief Complaint  Patient presents with  . Flank Pain  . Shoulder Pain     (Consider location/radiation/quality/duration/timing/severity/associated sxs/prior Treatment) HPI   Howard Carlson is a 24 y.o. male who presents for evaluation of right flank discomfort, which started several days ago and makes it hard to breathe. Initially he had some cough, but that has resolved. He denies trauma. He denies hematuria, dysuria, urinary frequency, nausea or vomiting. There are no other known modifying factors.   Past Medical History  Diagnosis Date  . Anxiety   . Bipolar affective (HCC)   . Schizophrenic disorder (HCC)    History reviewed. No pertinent past surgical history. History reviewed. No pertinent family history. Social History  Substance Use Topics  . Smoking status: Light Tobacco Smoker    Types: Cigarettes  . Smokeless tobacco: Never Used  . Alcohol Use: No     Comment: Patient denies    Review of Systems  All other systems reviewed and are negative.     Allergies  Amoxicillin  Home Medications   Prior to Admission medications   Medication Sig Start Date End Date Taking? Authorizing Provider  acetaminophen (TYLENOL) 500 MG tablet Take 1,000 mg by mouth every 6 (six) hours as needed.   Yes Historical Provider, MD  cyclobenzaprine (FLEXERIL) 10 MG tablet Take 1 tablet (10 mg total) by mouth 2 (two) times daily as needed for muscle spasms. 06/25/14  Yes Tiffany Neva SeatGreene, PA-C  HYDROcodone-acetaminophen (NORCO/VICODIN) 5-325 MG per tablet Take 1-2 tablets by mouth every 4 (four) hours as needed. Patient not taking: Reported on 09/21/2014 06/25/14   Marlon Peliffany Greene, PA-C  predniSONE (DELTASONE) 10 MG tablet Take 2 tablets (20 mg total) by mouth daily. Patient not taking: Reported on 09/21/2014 06/25/14   Marlon Peliffany Greene, PA-C   BP 114/69 mmHg  Pulse 62   Temp(Src) 98.2 F (36.8 C) (Oral)  Resp 20  Ht 5\' 8"  (1.727 m)  Wt 180 lb (81.647 kg)  BMI 27.38 kg/m2  SpO2 99% Physical Exam  Constitutional: He is oriented to person, place, and time. He appears well-developed and well-nourished.  HENT:  Head: Normocephalic and atraumatic.  Right Ear: External ear normal.  Left Ear: External ear normal.  Eyes: Conjunctivae and EOM are normal. Pupils are equal, round, and reactive to light.  Neck: Normal range of motion and phonation normal. Neck supple.  Cardiovascular: Normal rate, regular rhythm and normal heart sounds.   Pulmonary/Chest: Effort normal and breath sounds normal. He exhibits no bony tenderness.  Mild right lateral chest wall tenderness, without crepitation or deformity.  Abdominal: Soft. He exhibits no distension. There is no tenderness. There is no guarding.  Genitourinary:  No costovertebral angle tenderness  Musculoskeletal: Normal range of motion.  Neurological: He is alert and oriented to person, place, and time. No cranial nerve deficit or sensory deficit. He exhibits normal muscle tone. Coordination normal.  Skin: Skin is warm, dry and intact.  Psychiatric: He has a normal mood and affect. His behavior is normal. Judgment and thought content normal.  Nursing note and vitals reviewed.   ED Course  Procedures (including critical care time)  Medications - No data to display  Patient Vitals for the past 24 hrs:  BP Temp Temp src Pulse Resp SpO2 Height Weight  11/21/14 2100 114/65 mmHg - - 76 16 100 % - -  11/21/14 1950 114/69 mmHg 98.2 F (36.8 C) Oral 62 20 99 %  (1.727 m) 180 lb (81.647 kg)    11:50 PM Reevaluation with update and discussion. After initial assessment and treatment, an updated evaluation reveals no further complaints. He seems upset, and is concerned that no serious problems have been found to account for his pain. His girlfriend asked if it might be an STD, gas or reflux. I discussed these  possibilities with the patient and the lack of significant findings on screening evaluation. He agrees a trial of pain medicines, rest and heat, and further evaluation, if that does not work.Mancel Bale L    Labs Review Labs Reviewed  URINE RAPID DRUG SCREEN, HOSP PERFORMED  URINALYSIS, ROUTINE W REFLEX MICROSCOPIC (NOT AT [facility])    Imaging Review No results found. I have personally reviewed and evaluated these images and lab results as part of my medical decision-making.   EKG Interpretation None      MDM   Final diagnoses:  Right flank pain     Nonspecific flank pain, doubt PE, pneumonia, ACS or urinary tract abnormality.  Nursing Notes Reviewed/ Care Coordinated Applicable Imaging Reviewed Interpretation of Laboratory Data incorporated into ED treatment  The patient appears reasonably screened and/or stabilized for discharge and I doubt any other medical condition or other [facility] requiring further screening, evaluation, or treatment in the ED at this time prior to discharge.  Plan: Home Medications- Norco; Home Treatments- heat; return here if the recommended treatment, does not improve the symptoms; Recommended follow up- PCP prn if not better in 1 week    Mancel Bale, MD 11/21/14 2352

## 2014-11-21 NOTE — ED Notes (Signed)
Pt states four days ago he began having pain in right side and up into shoulder area,  Over the past four days it is harder for him to get a deep breath and the pain is getting worse,  No known injury

## 2014-11-21 NOTE — ED Notes (Signed)
Pt states x4 days prior began with right upper quadrant pain, no N/V/D, back pain, difficulty urinating, chest pain, SOB. States pain radiates up into middle of back, difficult to take deep breath d/t sharp pain.

## 2014-11-22 MED ORDER — HYDROCODONE-ACETAMINOPHEN 5-325 MG PO TABS
2.0000 | ORAL_TABLET | Freq: Once | ORAL | Status: AC
  Administered 2014-11-22: 2 via ORAL
  Filled 2014-11-22: qty 2

## 2015-06-30 ENCOUNTER — Emergency Department (HOSPITAL_COMMUNITY)
Admission: EM | Admit: 2015-06-30 | Discharge: 2015-06-30 | Disposition: A | Discharge status: Home / Self Care | Payer: Self-pay | Attending: Emergency Medicine | Admitting: Emergency Medicine

## 2015-06-30 ENCOUNTER — Encounter (HOSPITAL_COMMUNITY): Payer: Self-pay

## 2015-06-30 DIAGNOSIS — Z880 Allergy status to penicillin: Secondary | ICD-10-CM | POA: Insufficient documentation

## 2015-06-30 DIAGNOSIS — N289 Disorder of kidney and ureter, unspecified: Secondary | ICD-10-CM | POA: Insufficient documentation

## 2015-06-30 DIAGNOSIS — F17210 Nicotine dependence, cigarettes, uncomplicated: Secondary | ICD-10-CM | POA: Insufficient documentation

## 2015-06-30 DIAGNOSIS — F1410 Cocaine abuse, uncomplicated: Secondary | ICD-10-CM | POA: Insufficient documentation

## 2015-06-30 LAB — COMPREHENSIVE METABOLIC PANEL
ALBUMIN: 3.8 g/dL (ref 3.5–5.0)
ALK PHOS: 50 U/L (ref 38–126)
ALT: 14 U/L — ABNORMAL LOW (ref 17–63)
ANION GAP: 8 (ref 5–15)
AST: 18 U/L (ref 15–41)
BUN: 14 mg/dL (ref 6–20)
CALCIUM: 9.2 mg/dL (ref 8.9–10.3)
CHLORIDE: 101 mmol/L (ref 101–111)
CO2: 29 mmol/L (ref 22–32)
Creatinine, Ser: 1.39 mg/dL — ABNORMAL HIGH (ref 0.61–1.24)
GFR calc non Af Amer: >60 mL/min (ref >60)
GLUCOSE: 84 mg/dL (ref 65–99)
POTASSIUM: 4.2 mmol/L (ref 3.5–5.1)
SODIUM: 138 mmol/L (ref 135–145)
Total Bilirubin: 0.7 mg/dL (ref 0.3–1.2)
Total Protein: 7.1 g/dL (ref 6.5–8.1)

## 2015-06-30 LAB — CBC
HEMATOCRIT: 39.9 % (ref 39.0–52.0)
HEMOGLOBIN: 13.4 g/dL (ref 13.0–17.0)
MCH: 28.3 pg (ref 26.0–34.0)
MCHC: 33.6 g/dL (ref 30.0–36.0)
MCV: 84.4 fL (ref 78.0–100.0)
Platelets: 170 10*3/uL (ref 150–400)
RBC: 4.73 MIL/uL (ref 4.22–5.81)
RDW: 12.9 % (ref 11.5–15.5)
WBC: 5 10*3/uL (ref 4.0–10.5)

## 2015-06-30 LAB — RAPID URINE DRUG SCREEN, HOSP PERFORMED
AMPHETAMINES: NOT DETECTED
BARBITURATES: NOT DETECTED
BENZODIAZEPINES: NOT DETECTED
Cocaine: POSITIVE — AB
Opiates: NOT DETECTED
TETRAHYDROCANNABINOL: POSITIVE — AB

## 2015-06-30 LAB — ETHANOL: Alcohol, Ethyl (B): <5 mg/dL (ref <5)

## 2015-06-30 NOTE — ED Provider Notes (Signed)
CSN: [id number]     Arrival date & time 06/30/15  1807 History   First MD Initiated Contact with Patient 06/30/15 2132     Chief Complaint  Patient presents with  . Medical Clearance     (Consider location/radiation/quality/duration/timing/severity/associated sxs/prior Treatment) HPI Comments: Pt presents for treatment of cocaine abuse.  He has contacted [facility] for treatment and was told that he needs medical clearance.  He last used cocaine this morning. He also occasionally uses marijuana. He denies any alcohol use. He denies any injected drugs. He denies any other drug use. He denies any physical complaints. No chest pain or shortness of breath. No fevers or other recent illnesses. He denies any depression or suicidal ideations. No hallucinations.   Past Medical History  Diagnosis Date  . Anxiety   . Bipolar affective (HCC)   . Schizophrenic disorder (HCC)    History reviewed. No pertinent past surgical history. No family history on file. Social History  Substance Use Topics  . Smoking status: Light Tobacco Smoker    Types: Cigarettes  . Smokeless tobacco: Never Used  . Alcohol Use: No     Comment: Patient denies    Review of Systems  Constitutional: Negative for fever, chills, diaphoresis and fatigue.  HENT: Negative for congestion, rhinorrhea and sneezing.   Eyes: Negative.   Respiratory: Negative for cough, chest tightness and shortness of breath.   Cardiovascular: Negative for chest pain and leg swelling.  Gastrointestinal: Negative for nausea, vomiting, abdominal pain, diarrhea and blood in stool.  Genitourinary: Negative for frequency, hematuria, flank pain and difficulty urinating.  Musculoskeletal: Negative for back pain and arthralgias.  Skin: Negative for rash.  Neurological: Negative for dizziness, speech difficulty, weakness, numbness and headaches.      Allergies  Amoxicillin  Home Medications   Prior to Admission medications   Medication Sig Start  Date End Date Taking? Authorizing Provider  HYDROcodone-acetaminophen (NORCO/VICODIN) 5-325 MG tablet Take 1-2 tablets by mouth every 4 (four) hours as needed. 11/21/14   Mancel Bale, MD   BP 140/87 mmHg  Pulse 107  Temp(Src) 99.2 F (37.3 C) (Oral)  Resp 18  SpO2 98% Physical Exam  Constitutional: He is oriented to person, place, and time. He appears well-developed and well-nourished.  HENT:  Head: Normocephalic and atraumatic.  Eyes: Pupils are equal, round, and reactive to light.  Neck: Normal range of motion. Neck supple.  Cardiovascular: Normal rate, regular rhythm and normal heart sounds.   Pulmonary/Chest: Effort normal and breath sounds normal. No respiratory distress. He has no wheezes. He has no rales. He exhibits no tenderness.  Abdominal: Soft. Bowel sounds are normal. There is no tenderness. There is no rebound and no guarding.  Musculoskeletal: Normal range of motion. He exhibits no edema.  Lymphadenopathy:    He has no cervical adenopathy.  Neurological: He is alert and oriented to person, place, and time.  Skin: Skin is warm and dry. No rash noted.  Psychiatric: He has a normal mood and affect.    ED Course  Procedures (including critical care time) Labs Review Labs Reviewed  COMPREHENSIVE METABOLIC PANEL - Abnormal; Notable for the following:    Creatinine, Ser 1.39 (*)    ALT 14 (*)    All other components within normal limits  URINE RAPID DRUG SCREEN, HOSP PERFORMED - Abnormal; Notable for the following:    Cocaine POSITIVE (*)    Tetrahydrocannabinol POSITIVE (*)    All other components within normal limits  ETHANOL  CBC  Imaging Review No results found. I have personally reviewed and evaluated these images and lab results as part of my medical decision-making.   EKG Interpretation None      MDM   Final diagnoses:  Cocaine abuse    Patient presents for treatment of cocaine abuse. He is medically clear. His creatinine is mildly elevated  as compared to his prior values. I did advise him of this and encouraged him to follow-up with a primary care provider for recheck. I encouraged him to drink clear fluids. I advised him to call [facility] in the am for possible treatment.  He was also discharged with other resources for substance abuse treatment. Return precautions were given.    Rolan BuccoMelanie Lenaya Pietsch, MD 06/30/15 2145

## 2015-06-30 NOTE — ED Notes (Addendum)
Patient here requesting medical clearance so he can get into [facility] for detox-crack cocaine. States lasted used crack cocaine 0200. Marijuana use as well. No complains, no suicidal thoughts. Patient alert and oriented, no tremors, no nausea, no distress

## 2015-06-30 NOTE — Discharge Instructions (Signed)
Your creatinine (kidney function) is abnormal.  This needs to be rechecked by a primary care provider.  Otherwise, your labs look okay.    [employer] Guide Inpatient Behavioral Health/Residential  Substance Abuse Treatment Adults The [employer] 211 is a great source of information about community services available.  Access by dialing 2-1-1 from anywhere in [place], or by website -  PooledIncome.pl.   (Updated 01/2015)  Crisis Assistance 24 hours a day   Services Offered    [facility] [employer]  24-hour crisis assistance: [phone] [place], [place]   [facility]  24-hour crisis assistance:[phone] [place], [place]  [place]   24-hour crisis assistance: [phone] [address] [place], [place]   [facility] Access to Care Line  24-hour crisis assistance; [phone] All   [facility]  24-hour crisis response line: [phone]) 802-3882 All   Other Local Resources (Updated 01/2015)  Inpatient Behavioral Health/Residential Substance Abuse Treatment Programs   Services      Address and Phone Number  [facility] ([facility])   14-day residential rehabilitation  [phone] 100 [address] [place], [place]  [facility] ([facility])    Detox - private pay only  14-day residential rehabilitation -  Medicaid, insurance, private pay only [phone], or [phone] [address], [place], [place] [address]   [facility] [employer] only  Multiple facilities [phone] admissions   [facility] ([facility])   90-day program  Must be homeless to participate  [phone], or [phone] Marcy Panning, [facility]  [facility] only [phone], or  [phone] [address] [place], [place] [address]  [facility]     Must make an appointment  Transportation is  offered from [place] on [employer].  Accepts private pay, Sheryn Bison [facility] [phone]  [address] [place], [place] [address]   [employer]  Females only  Associated with the [facility] [phone] [address] [place], [place] [address]  [facility] only [phone], or [phone] [address] [place], [id number]  [facility]    Detox  Residential rehabilitation  Private insurance only  Multiple locations [phone] admissions  [facility]    Private pay  Private insurance [phone] [address] [place], [place] [address]  [facility]    Males only  Fee required at time of admission [phone] [address] [place], [place] [address]  [facility]    Private pay only  [phone] [place], [place]  [facility] ([facility] Treatment Services)    Detox - private pay, Medicaid  Residential rehabilitation for males  - Medicare, Medicaid, insurance, private pay [phone] [address] [place], [place]   [facility]    Walk-in interviews Monday - Saturday from 8 am - 4 pm  Individuals with legal charges are not eligible [phone] [address] [place], [place] [address]  [facility]   Must be willing to work  Must attend Alcoholics Anonymous meetings [phone] [address] [place], [place]   [facility] [employer]    Faith-based program  Private pay only [phone] [address] [place], [place]    Polysubstance Abuse When people abuse more than one drug or type of drug it is called polysubstance or polydrug abuse. For example, many smokers also drink alcohol. This is one form of polydrug abuse. Polydrug abuse also refers to the use  of a drug to counteract an unpleasant effect produced by another drug. It may also be used to help with withdrawal from another drug. People who take  stimulants may become agitated. Sometimes this agitation is countered with a tranquilizer. This helps protect against the unpleasant side effects. Polydrug abuse also refers to the use of different drugs at the same time.  Anytime drug use is interfering with normal living activities, it has become abuse. This includes problems with family and friends. Psychological dependence has developed when your mind tells you that the drug is needed. This is usually followed by physical dependence which has developed when continuing increases of drug are required to get the same feeling or "high". This is known as addiction or chemical dependency. A person's risk is much higher if there is a history of chemical dependency in the family. SIGNS OF CHEMICAL DEPENDENCY  You have been told by friends or family that drugs have become a problem.  You fight when using drugs.  You are having blackouts (not remembering what you do while using).  You feel sick from using drugs but continue using.  You lie about use or amounts of drugs (chemicals) used.  You need chemicals to get you going.  You are suffering in work performance or in school because of drug use.  You get sick from use of drugs but continue to use anyway.  You need drugs to relate to people or feel comfortable in social situations.  You use drugs to forget problems. "Yes" answered to any of the above signs of chemical dependency indicates there are problems. The longer the use of drugs continues, the greater the problems will become. If there is a family history of drug or alcohol use, it is best not to experiment with these drugs. Continual use leads to tolerance. After tolerance develops more of the drug is needed to get the same feeling. This is followed by addiction. With addiction, drugs become the most important part of life. It becomes more important to take drugs than participate in the other usual activities of life. This includes relating  to friends and family. Addiction is followed by dependency. Dependency is a condition where drugs are now needed not just to get high, but to feel normal. Addiction cannot be cured but it can be stopped. This often requires outside help and the care of professionals. Treatment centers are listed in the yellow pages under: Cocaine, Narcotics, and Alcoholics Anonymous. Most hospitals and clinics can refer you to a specialized care center. Talk to your caregiver if you need help.   This information is not intended to replace advice given to you by your health care provider. Make sure you discuss any questions you have with your health care provider.   Document Released: 09/01/2004 Document Revised: 04/04/2011 Document Reviewed: 01/15/2014 Elsevier Interactive Patient Education [employer]2016 Elsevier Inc.

## 2016-01-12 ENCOUNTER — Emergency Department (HOSPITAL_COMMUNITY)
Admission: EM | Admit: 2016-01-12 | Discharge: 2016-01-13 | Disposition: A | Discharge status: Other Acute Inpatient Hospital | Payer: Self-pay | Attending: Emergency Medicine | Admitting: Emergency Medicine

## 2016-01-12 ENCOUNTER — Encounter (HOSPITAL_COMMUNITY): Payer: Self-pay | Admitting: *Deleted

## 2016-01-12 DIAGNOSIS — Z79899 Other long term (current) drug therapy: Secondary | ICD-10-CM | POA: Insufficient documentation

## 2016-01-12 DIAGNOSIS — R440 Auditory hallucinations: Secondary | ICD-10-CM

## 2016-01-12 DIAGNOSIS — F17210 Nicotine dependence, cigarettes, uncomplicated: Secondary | ICD-10-CM | POA: Insufficient documentation

## 2016-01-12 DIAGNOSIS — R45851 Suicidal ideations: Secondary | ICD-10-CM | POA: Insufficient documentation

## 2016-01-12 DIAGNOSIS — F250 Schizoaffective disorder, bipolar type: Secondary | ICD-10-CM | POA: Diagnosis present

## 2016-01-12 LAB — URINALYSIS, ROUTINE W REFLEX MICROSCOPIC
BILIRUBIN URINE: NEGATIVE
Glucose, UA: NEGATIVE mg/dL
Hgb urine dipstick: NEGATIVE
Ketones, ur: 5 mg/dL — AB
Nitrite: NEGATIVE
Protein, ur: NEGATIVE mg/dL
SPECIFIC GRAVITY, URINE: 1.029 (ref 1.005–1.030)
SQUAMOUS EPITHELIAL / LPF: NONE SEEN
pH: 6 (ref 5.0–8.0)

## 2016-01-12 LAB — CBC WITH DIFFERENTIAL/PLATELET
BASOS PCT: 1 %
Basophils Absolute: 0 10*3/uL (ref 0.0–0.1)
EOS ABS: 0.1 10*3/uL (ref 0.0–0.7)
EOS PCT: 2 %
HCT: 41.3 % (ref 39.0–52.0)
Hemoglobin: 14.5 g/dL (ref 13.0–17.0)
LYMPHS PCT: 36 %
Lymphs Abs: 1.8 10*3/uL (ref 0.7–4.0)
MCH: 29.3 pg (ref 26.0–34.0)
MCHC: 35.1 g/dL (ref 30.0–36.0)
MCV: 83.4 fL (ref 78.0–100.0)
Monocytes Absolute: 0.3 10*3/uL (ref 0.1–1.0)
Monocytes Relative: 6 %
Neutro Abs: 2.8 10*3/uL (ref 1.7–7.7)
Neutrophils Relative %: 55 %
PLATELETS: 204 10*3/uL (ref 150–400)
RBC: 4.95 MIL/uL (ref 4.22–5.81)
RDW: 13.3 % (ref 11.5–15.5)
WBC: 5.1 10*3/uL (ref 4.0–10.5)

## 2016-01-12 LAB — COMPREHENSIVE METABOLIC PANEL
ALBUMIN: 4 g/dL (ref 3.5–5.0)
ALT: 16 U/L — ABNORMAL LOW (ref 17–63)
ANION GAP: 6 (ref 5–15)
AST: 20 U/L (ref 15–41)
Alkaline Phosphatase: 53 U/L (ref 38–126)
BILIRUBIN TOTAL: 0.6 mg/dL (ref 0.3–1.2)
BUN: 19 mg/dL (ref 6–20)
CO2: 26 mmol/L (ref 22–32)
Calcium: 9.1 mg/dL (ref 8.9–10.3)
Chloride: 106 mmol/L (ref 101–111)
Creatinine, Ser: 1.21 mg/dL (ref 0.61–1.24)
GFR calc Af Amer: >60 mL/min (ref >60)
GFR calc non Af Amer: >60 mL/min (ref >60)
GLUCOSE: 75 mg/dL (ref 65–99)
POTASSIUM: 4 mmol/L (ref 3.5–5.1)
Sodium: 138 mmol/L (ref 135–145)
TOTAL PROTEIN: 7.5 g/dL (ref 6.5–8.1)

## 2016-01-12 LAB — RAPID URINE DRUG SCREEN, HOSP PERFORMED
Amphetamines: NOT DETECTED
BENZODIAZEPINES: NOT DETECTED
Barbiturates: NOT DETECTED
COCAINE: POSITIVE — AB
OPIATES: NOT DETECTED
TETRAHYDROCANNABINOL: POSITIVE — AB

## 2016-01-12 LAB — ACETAMINOPHEN LEVEL

## 2016-01-12 LAB — SALICYLATE LEVEL

## 2016-01-12 LAB — ETHANOL

## 2016-01-12 MED ORDER — HYDROCORTISONE 1 % EX CREA
TOPICAL_CREAM | CUTANEOUS | Status: DC | PRN
  Filled 2016-01-12: qty 28

## 2016-01-12 MED ORDER — LORAZEPAM 1 MG PO TABS: 1.0000 mg | ORAL_TABLET | Freq: Three times a day (TID) | ORAL | Status: DC | PRN

## 2016-01-12 MED ORDER — ZIPRASIDONE MESYLATE 20 MG IM SOLR: 20.0000 mg | Freq: Once | INTRAMUSCULAR | Status: DC | PRN

## 2016-01-12 MED ORDER — RISPERIDONE 2 MG PO TABS
2.0000 mg | ORAL_TABLET | Freq: Every day | ORAL | Status: DC
  Administered 2016-01-12 – 2016-01-13 (×2): 2 mg via ORAL
  Filled 2016-01-12 (×2): qty 1

## 2016-01-12 NOTE — Progress Notes (Addendum)
Pt noted without insurance nor pcp for follow up care  Cm noted pt assessment stating pt hearing voices CM went to see pt but noted he was asleep Pt not awakened  CM left in pt TCU locker #28 written information to assist pt with determining choice for uninsured accepting pcps, www.needymeds.org, www.goodrx.com, discounted pharmacies and other [place] resources such as [facility] , [facility], affordable care act, financial assistance, uninsured dental services, [place] med assist, DSS and  health department  Provided resources for [place] uninsured accepting pcps like [facility] [place], family medicine at [facility] [address] [facility] [facility], [facility], [facility], local urgent care centers, [facility], [facility] family practice, general medical clinics, homeless shelter, Office managerRC/urban ministry, Development worker, international aidcongregational nursing, family services of the [place], [facility] urgent care plus others, medication resources, [facility] out patient pharmacies and housing Provided [facility] [facility] contact information

## 2016-01-12 NOTE — ED Notes (Signed)
Gave report to Diane, Charity fundraiserN for [facility].

## 2016-01-12 NOTE — ED Notes (Signed)
Patient refused for me to collect his labs

## 2016-01-12 NOTE — Progress Notes (Signed)
This Clinical research associatewriter reviewed the chart for potential placement.    Howard Carlson, MSW, Tinnie GensLCSW, LCAS, CCSI [facility] Triage Specialist [phone]

## 2016-01-12 NOTE — ED Provider Notes (Signed)
Emergency Department Provider Note   I have reviewed the triage vital signs and the nursing notes.   HISTORY  Chief Complaint Psychiatric Evaluation   HPI Howard Carlson is a 25 y.o. male with PMH of bipolar disorder and schizophrenia presents to the emergency pertinent for evaluation of worsening auditory command hallucinations and suicidal thinking. Patient cannot recall the onset of these thoughts. The patient is hesitant to describe to me exactly what the voices are telling him. He does state that he stopped his risperidone and Effexor [name] time ago because the "voices told me that I didn't need to take it anymore." He apparently went to his neighbor's house but cannot recall how he got there. Noting to police who escorted him to the emergency department he was describing suicidal or homicidal ideations there and was reportedly trying to get help. Patient endorses worsening suicidal thinking. Denies EtOH or drug abuse.    Past Medical History:  Diagnosis Date  . Anxiety   . Bipolar affective (HCC)   . Schizophrenic disorder [facility](HCC)     Patient Active Problem List   Diagnosis Date Noted  . Adjustment disorder with depressed mood 09/22/2014  . Cocaine abuse 09/22/2014    History reviewed. No pertinent surgical history.  Current Outpatient Rx  . Order #: [id number] Class: Print    Allergies Amoxicillin  History reviewed. No pertinent family history.  Social History Social History  Substance Use Topics  . Smoking status: Light Tobacco Smoker    Types: Cigarettes  . Smokeless tobacco: Never Used  . Alcohol use No     Comment: Patient denies    Review of Systems  Constitutional: No fever/chills Eyes: No visual changes. ENT: No sore throat. Cardiovascular: Denies chest pain. Respiratory: Denies shortness of breath. Gastrointestinal: No abdominal pain.  No nausea, no vomiting.  No diarrhea.  No constipation. Genitourinary: Negative for  dysuria. Musculoskeletal: Negative for back pain. Skin: Negative for rash. Neurological: Negative for headaches, focal weakness or numbness. Psychiatric:Positive auditory hallucinations and suicidal thoughts.   10-point ROS otherwise negative.  ____________________________________________   PHYSICAL EXAM:  VITAL SIGNS: ED Triage Vitals [01/12/16 0851]  Enc Vitals Group     BP 128/82     Pulse Rate 67     Resp 18     Temp 98.6 F (37 C)     Temp Source Oral     SpO2 100 %   Constitutional: Alert and oriented. Sitting upright in bed. Looks slightly anxious.  Eyes: Conjunctivae are normal.  Head: Atraumatic. Nose: No congestion/rhinnorhea. Mouth/Throat: Mucous membranes are moist.  Oropharynx non-erythematous. Neck: No stridor.   Cardiovascular: Normal rate, regular rhythm. Good peripheral circulation. Grossly normal heart sounds.   Respiratory: Normal respiratory effort.  No retractions. Lungs CTAB. Gastrointestinal: Soft and nontender. No distention.  Musculoskeletal: No lower extremity tenderness nor edema. No gross deformities of extremities. Neurologic:  Normal speech and language. No gross focal neurologic deficits are appreciated.  Skin:  Skin is warm, dry and intact. No rash noted. Psychiatric: Mood and affect are bizarre. Patient appears to be responding to internal stimulus.   ____________________________________________   LABS (all labs ordered are listed, but only abnormal results are displayed)  Labs Reviewed  COMPREHENSIVE METABOLIC PANEL - Abnormal; Notable for the following:       Result Value   ALT 16 (*)    All other components within normal limits  ACETAMINOPHEN LEVEL - Abnormal; Notable for the following:    Acetaminophen (Tylenol), Serum <10 (*)  All other components within normal limits  URINALYSIS, ROUTINE W REFLEX MICROSCOPIC - Abnormal; Notable for the following:    APPearance HAZY (*)    Ketones, ur 5 (*)    Leukocytes, UA MODERATE (*)     Bacteria, UA RARE (*)    All other components within normal limits  RAPID URINE DRUG SCREEN, HOSP PERFORMED - Abnormal; Notable for the following:    Cocaine POSITIVE (*)    Tetrahydrocannabinol POSITIVE (*)    All other components within normal limits  ETHANOL  SALICYLATE LEVEL  CBC WITH DIFFERENTIAL/PLATELET   ____________________________________________   PROCEDURES  Procedure(s) performed:   Procedures  None ____________________________________________   INITIAL IMPRESSION / ASSESSMENT AND PLAN / ED COURSE  Pertinent labs & imaging results that were available during my care of the patient were reviewed by me and considered in my medical decision making (see chart for details).  Patient presents to the emergency department for evaluation of suicidal thinking and auditory hallucinations. The patient is off his medication for an unknown period of time. He appears anxious and seems to be responding to internal stimulus during my conversation. He is able to tell me that he was prescribed risperidone and Effexor but cannot tell me the doses of these medications. They're not listed in Epic. He seems slightly agitated and verbalizes fear that everybody in the nursing station in a stocking about him. Feel this patient is a danger to both himself and other people. I have filled out IVC paperwork and will give starting dose of risperidone as patient states that on this past. Plan for medical clearance and psychiatry evaluation.   12:54 PM I reviewed the patient's labs. He is medically cleared for psychiatry evaluation. Unsure of his home doses of psychiatry medications. No old records. Patient unable to provide this information.  ____________________________________________  FINAL CLINICAL IMPRESSION(S) / ED DIAGNOSES  Final diagnoses:  Suicidal ideation  Auditory hallucinations     MEDICATIONS GIVEN DURING THIS VISIT:  Medications  ziprasidone (GEODON) injection 20 mg (not  administered)  LORazepam (ATIVAN) tablet 1 mg (not administered)  risperiDONE (RISPERDAL) tablet 2 mg (2 mg Oral Given 01/12/16 0951)     NEW OUTPATIENT MEDICATIONS STARTED DURING THIS VISIT:  None   Note:  This document was prepared using Dragon voice recognition software and may include unintentional dictation errors.  Alona BeneJoshua Sharlie Shreffler, MD Emergency Medicine   Maia PlanJoshua G Justus Duerr, MD 01/12/16 1255

## 2016-01-12 NOTE — ED Notes (Signed)
Introduced self to patient. Pt oriented to unit expectations.  Assessed pt for:  A) Anxiety &/or agitation: On admission to the [facility] pt is calm, cooperative and pleasant, but seems guarded and suspicious. His speech is soft and seems to block his thoughts. He admits to not taking his medications. When asked by this writer if he hears voices telling him to hurt himself he said, "yes", but seemed unable to elaborate.  S) Safety: Safety maintained with q-15-minute checks and hourly rounds by staff. Pt assessed for contraband, rewanded on admission and is safe.  A) ADLs: Pt able to perform ADLs independently.  P) Pick-Up (room cleanliness): Pt's room clean and free of clutter.

## 2016-01-12 NOTE — ED Notes (Signed)
Bed: [id number] Expected date:  Expected time:  Means of arrival:  Comments: [employer] vol psych

## 2016-01-12 NOTE — ED Notes (Addendum)
Gave report to Diane, Charity fundraiserN for [facility]. Patient was w[facility] by security and [facility] Gave belongings and IVC papers to Diane, Charity fundraiserN.

## 2016-01-12 NOTE — ED Notes (Signed)
Patient currently asleep; respirations regular and unlabored. No distress noted.

## 2016-01-12 NOTE — Progress Notes (Signed)
Entered in d/c instructions Please use resources left for you by ED case manager to assist with follow up uninsured medical care,housing,homeless resources,etc 

## 2016-01-12 NOTE — ED Triage Notes (Signed)
Patient is alert and oriented x4.  He is being brought in by [employer].  Patient states he has been hearing voices and went to a neighbors house to get help.  Patient confirms SI and HI.
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
## 2016-01-12 NOTE — ED Notes (Signed)
Patient refused blood draw earlier.  Patient has been IVC and blood has been drawn

## 2016-01-13 ENCOUNTER — Inpatient Hospital Stay (HOSPITAL_COMMUNITY)
Admission: AD | Admit: 2016-01-13 | Discharge: 2016-01-21 | DRG: 885 | Disposition: A | Discharge status: Home / Self Care | Payer: Federal, State, Local not specified - Other | Attending: Psychiatry | Admitting: Psychiatry

## 2016-01-13 ENCOUNTER — Encounter (HOSPITAL_COMMUNITY): Payer: Self-pay

## 2016-01-13 DIAGNOSIS — G4700 Insomnia, unspecified: Secondary | ICD-10-CM | POA: Diagnosis present

## 2016-01-13 DIAGNOSIS — F250 Schizoaffective disorder, bipolar type: Secondary | ICD-10-CM | POA: Diagnosis not present

## 2016-01-13 DIAGNOSIS — F17210 Nicotine dependence, cigarettes, uncomplicated: Secondary | ICD-10-CM | POA: Diagnosis present

## 2016-01-13 DIAGNOSIS — F29 Unspecified psychosis not due to a substance or known physiological condition: Secondary | ICD-10-CM | POA: Diagnosis not present

## 2016-01-13 DIAGNOSIS — F411 Generalized anxiety disorder: Secondary | ICD-10-CM | POA: Diagnosis present

## 2016-01-13 DIAGNOSIS — R440 Auditory hallucinations: Secondary | ICD-10-CM | POA: Diagnosis present

## 2016-01-13 DIAGNOSIS — A749 Chlamydial infection, unspecified: Secondary | ICD-10-CM | POA: Clinically undetermined

## 2016-01-13 DIAGNOSIS — F1210 Cannabis abuse, uncomplicated: Secondary | ICD-10-CM | POA: Clinically undetermined

## 2016-01-13 DIAGNOSIS — Z79899 Other long term (current) drug therapy: Secondary | ICD-10-CM | POA: Diagnosis not present

## 2016-01-13 DIAGNOSIS — F1410 Cocaine abuse, uncomplicated: Secondary | ICD-10-CM | POA: Clinically undetermined

## 2016-01-13 DIAGNOSIS — Z888 Allergy status to other drugs, medicaments and biological substances status: Secondary | ICD-10-CM | POA: Diagnosis not present

## 2016-01-13 MED ORDER — ACETAMINOPHEN 325 MG PO TABS: 650.0000 mg | ORAL_TABLET | Freq: Four times a day (QID) | ORAL | Status: DC | PRN

## 2016-01-13 MED ORDER — TRAZODONE HCL 50 MG PO TABS
50.0000 mg | ORAL_TABLET | Freq: Every evening | ORAL | Status: DC | PRN
  Filled 2016-01-13 (×6): qty 1

## 2016-01-13 MED ORDER — ENSURE ENLIVE PO LIQD
237.0000 mL | Freq: Two times a day (BID) | ORAL | Status: DC
  Administered 2016-01-13 – 2016-01-21 (×16): 237 mL via ORAL

## 2016-01-13 MED ORDER — MAGNESIUM HYDROXIDE 400 MG/5ML PO SUSP
30.0000 mL | Freq: Every day | ORAL | Status: DC | PRN
  Administered 2016-01-14: 30 mL via ORAL
  Filled 2016-01-13: qty 30

## 2016-01-13 MED ORDER — HYDROXYZINE HCL 25 MG PO TABS
25.0000 mg | ORAL_TABLET | Freq: Four times a day (QID) | ORAL | Status: DC | PRN
  Administered 2016-01-15 – 2016-01-20 (×5): 25 mg via ORAL
  Filled 2016-01-13: qty 1
  Filled 2016-01-13: qty 10
  Filled 2016-01-13 (×4): qty 1

## 2016-01-13 MED ORDER — RISPERIDONE 2 MG PO TABS
2.0000 mg | ORAL_TABLET | Freq: Every day | ORAL | Status: DC
  Administered 2016-01-14 – 2016-01-20 (×7): 2 mg via ORAL
  Filled 2016-01-13 (×9): qty 1

## 2016-01-13 MED ORDER — ALUM & MAG HYDROXIDE-SIMETH 200-200-20 MG/5ML PO SUSP
30.0000 mL | ORAL | Status: DC | PRN
  Administered 2016-01-16 – 2016-01-18 (×2): 30 mL via ORAL
  Filled 2016-01-13 (×2): qty 30
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
## 2016-01-13 NOTE — Progress Notes (Signed)
Adult Psychoeducational Group Note  Date:  01/13/2016 Time:  8:47 PM  Group Topic/Focus:  Wrap-Up Group:   The focus of this group is to help patients review their daily goal of treatment and discuss progress on daily workbooks.   Participation Level:  Active  Participation Quality:  Appropriate  Affect:  Angry  Cognitive:  Appropriate  Insight: Appropriate  Engagement in Group:  Engaged  Modes of Intervention:  Discussion  Additional Comments:  Pt rated 8 out of 10. Pt stated he had a good day. Pt goal for tomorrow is to have a better day and not to let the voices over power his thinking.  Merlinda FrederickKeshia S Jaymison Luber 01/13/2016, 8:47 PM

## 2016-01-13 NOTE — BH Assessment (Signed)
[facility]   Princess BruinsAquicha Duff, MSW, Theresia MajorsLCSWA

## 2016-01-13 NOTE — Progress Notes (Signed)
D: Pt denies SI/HI/AVH. Pt is pleasant and cooperative. Pt spent some time in the dayroom and was on the phone earlier. Pt was appropriate this evening  A: Pt was offered support and encouragement. Pt was given scheduled medications. Pt was encourage to attend groups. Q 15 minute checks were done for safety.   R:Pt attends groups and interacts well with peers and staff. Pt is taking medication. Pt has no complaints.Pt receptive to treatment and safety maintained on unit.

## 2016-01-13 NOTE — BHH Suicide Risk Assessment (Signed)
BHH INPATIENT:  Family/Significant Other Suicide Prevention Education  Suicide Prevention Education:  Education Completed; No one has been identified by the patient as the family member/significant other with whom the patient will be residing, and identified as the person(s) who will aid the patient in the event of a mental health crisis (suicidal ideations/suicide attempt).  With written consent from the patient, the family member/significant other has been provided the following suicide prevention education, prior to the and/or following the discharge of the patient.  The suicide prevention education provided includes the following:  Suicide risk factors  Suicide prevention and interventions  National Suicide Hotline telephone number  [facility] assessment telephone number  [facility] Emergency Assistance 911  [facility] and/or Residential Mobile Crisis Unit telephone number  Request made of family/significant other to:  Remove weapons (e.g., guns, rifles, knives), all items previously/currently identified as safety concern.    Remove drugs/medications (over-the-counter, prescriptions, illicit drugs), all items previously/currently identified as a safety concern.  The family member/significant other verbalizes understanding of the suicide prevention education information provided.  The family member/significant other agrees to remove the items of safety concern listed above. The patient did not endorse SI at the time of admission, nor did the patient c/o SI during the stay here.  SPE not required.  Baldo DaubRodney B [facility] 01/13/2016, 11:58 AM

## 2016-01-13 NOTE — ED Notes (Signed)
[facility]  Report previously called to Gilmer MorElizabeth I., RN.  Patient is anxious about his admission.  [facility]  Patient was given his scheduled dose of 2 mg risperdal.  He is calm and cooperative.  Vitals wnl.  Patient denies any pain.  No thoughts of current self harm.  Patient hearing command voices telling him he's "worthless."  Reviewed admission process with patient. Patient left ambulatory with [employer].

## 2016-01-13 NOTE — Progress Notes (Signed)
Admission Note: Patient is an 25 year old male admitted to the unit for complain of auditory and visual hallucination with command to hurt self and other people.  Patient currently denies suicidal thoughts.  Patient is alert and oriented x 4.  Patient presents with flat affect and depressed mood.  Admission packet reviewed with patient and consent for treatment signed.  Skin assessment completed. Skin is dry and intact.  Personal belonging searched.  No contraband found.  Patient oriented to the unit, staff and room. Plan of care reviewed with patient and patient verbalizes understanding of plan.  Routine safety checks initiated.  Patient is safe on the unit.

## 2016-01-13 NOTE — B[facility] Group Notes (Signed)
[facility]  01/13/2016 , 1:37 PM    Type of Therapy:  M[employer] Presentation  Participation Level:  Active  Participation Quality:  Attentive  Affect:  Blunted  Cognitive:  Oriented  Insight:  Limited  Engagement in Therapy:  Engaged  Modes of Intervention:  Discussion, Education and Socialization  Summary of Progress/Problems:  Onalee HuaDavid from [employer] came to present his recovery story and play the guitar.  Stayed for most of the time, Eyes closed throughout.  Left after 45 minutes and did not return.  Howard Carlson, Howard Carlson B 01/13/2016 , 1:37 PM

## 2016-01-13 NOTE — Tx Team (Signed)
Initial Treatment Plan 01/13/2016 11:16 AM [name] RUE:[id number]    PATIENT STRESSORS: Financial difficulties Medication change or noncompliance Substance abuse   PATIENT STRENGTHS: Ability for insight Average or above average intelligence Communication skills Supportive family/friends   PATIENT IDENTIFIED PROBLEMS: "help with the voices"  "help with suicidal thoughts"  Anxiety  Depression  Suicidal thoughts             DISCHARGE CRITERIA:  Ability to meet basic life and health needs Motivation to continue treatment in a less acute level of care Verbal commitment to aftercare and medication compliance  PRELIMINARY DISCHARGE PLAN: Outpatient therapy Placement in alternative living arrangements  PATIENT/FAMILY INVOLVEMENT: This treatment plan has been presented to and reviewed with the patient, Howard Carlson, and/or family member.  The patient and family have been given the opportunity to ask questions and make suggestions.  Mickie BailElizabeth O Iwenekha, RN 01/13/2016, 11:16 AM

## 2016-01-14 DIAGNOSIS — F250 Schizoaffective disorder, bipolar type: Principal | ICD-10-CM

## 2016-01-14 DIAGNOSIS — Z79899 Other long term (current) drug therapy: Secondary | ICD-10-CM

## 2016-01-14 DIAGNOSIS — Z888 Allergy status to other drugs, medicaments and biological substances status: Secondary | ICD-10-CM

## 2016-01-14 DIAGNOSIS — F1410 Cocaine abuse, uncomplicated: Secondary | ICD-10-CM

## 2016-01-14 MED ORDER — TRAZODONE HCL 50 MG PO TABS
50.0000 mg | ORAL_TABLET | Freq: Every day | ORAL | Status: DC
  Filled 2016-01-14 (×2): qty 1

## 2016-01-14 MED ORDER — VENLAFAXINE HCL ER 37.5 MG PO CP24
37.5000 mg | ORAL_CAPSULE | Freq: Every day | ORAL | Status: DC
  Administered 2016-01-15 – 2016-01-18 (×4): 37.5 mg via ORAL
  Filled 2016-01-14 (×5): qty 1

## 2016-01-14 NOTE — Progress Notes (Signed)
NUTRITION ASSESSMENT  Pt identified as at risk on the Malnutrition Screen Tool  INTERVENTION: 1. Supplements: Continue Ensure Enlive po BID, each supplement provides 350 kcal and 20 grams of protein  NUTRITION DIAGNOSIS: Unintentional weight loss related to sub-optimal intake as evidenced by pt report.   Goal: Pt to meet >/= 90% of their estimated nutrition needs.  Monitor:  PO intake  Assessment:  Pt admitted with history of schizophrenia and substance abuse(cocaine, THC). Pt reports good appetite. Per chart review, pt has lost 22 lb since October of 2016, that is 12% wt loss x >1 year which is not significant for time frame. Pt has been ordered ensure supplements, will continue given increased needs from substance abuse.  Height: Ht Readings from Last 1 Encounters:  01/13/16 5\' 8"  (1.727 m)    Weight: Wt Readings from Last 1 Encounters:  01/13/16 158 lb (71.7 kg)    Weight Hx: Wt Readings from Last 10 Encounters:  01/13/16 158 lb (71.7 kg)  11/21/14 180 lb (81.6 kg)    BMI:  Body mass index is 24.02 kg/m. Pt meets criteria for normal range based on current BMI.  Estimated Nutritional Needs: Kcal: 25-30 kcal/kg Protein: > 1 gram protein/kg Fluid: 1 ml/kcal  Diet Order: Diet Heart Room service appropriate? Yes; Fluid consistency: Thin Pt is also offered choice of unit snacks mid-morning and mid-afternoon.  Pt is eating as desired.   Lab results and medications reviewed.   Howard FrancoLindsey Melany Wiesman, MS, RD, LDN Pager: [phone] After Hours Pager: [phone]

## 2016-01-14 NOTE — BHH Group Notes (Signed)
BHH Group Notes:  (Counselor/Nursing/MHT/Case Management/Adjunct)  01/14/2016 1:15PM  Type of Therapy:  Group Therapy  Participation Level:  Active  Participation Quality:  Appropriate  Affect:  Flat  Cognitive:  Oriented  Insight:  Improving  Engagement in Group:  Limited  Engagement in Therapy:  Limited  Modes of Intervention:  Discussion, Exploration and Socialization  Summary of Progress/Problems: The topic for group was balance in life.  Pt participated in the discussion about when their life was in balance and out of balance and how this feels.  Pt discussed ways to get back in balance and short term goals they can work on to get where they want to be. Stayed the entire time, engaged throughout.  "I'm feeling pretty balanced, and I know this because I am not angry or fighting anyone right now.  I am feeling a little tense, but it's OK."  Talked about being outside at night, looking at the stars in the sky, maybe with a fire and his dog, as something that helps him find balance.  Also, writing lyrics and a hook helps him feel good.   Daryel Geraldorth, Hopelynn Gartland B 01/14/2016 2:21 PM

## 2016-01-14 NOTE — Progress Notes (Signed)
D: Patient denies SI/HI and A/V hallucinations; patient reports sleep is fair; reports appetite is good; reports energy level is low ; reports ability to concentrate is good; rates depression as 6/10; rates hopelessness 4/10; rates anxiety as 6/10  A: Monitored q 15 minutes; patient encouraged to attend groups; patient educated about medications; patient given medications per physician orders; patient encouraged to express feelings and/or concerns  R: Patient is cooperative; patient is animated; patient forwards little information; patient's interaction with staff and peers is minimal; patient was able to set goal to talk with staff 1:1 when having feelings of SI; patient is taking medications as prescribed and tolerating medications; patient is attending all groups and engaging

## 2016-01-14 NOTE — Progress Notes (Signed)
Recreation Therapy Notes  Date: 01/14/16 Time: 1000 Location: 500 Hall Dayroom  Group Topic: Anger Management  Goal Area(s) Addresses:  Patient will identify triggers for anger.  Patient will identify physical reaction to anger.   Patient will identify benefit of using coping skills when angry.  Behavioral Response: Engaged  Intervention: Anger worksheet, pencils  Activity: Anger Umbrella.  Patients were given a worksheet with an umbrella on it.  Inside to umbrella, patients were to write out the emotions that cause then to get angry.  If patients couldn't think of the emotions that cause them to get angry, they were to write out the things/situations that cause them to get angry.  Patients were then to identify coping skills they could use to deal with their anger and write them on the outside of the umbrella.  Education: Anger Management, Discharge Planning   Education Outcome: Acknowledges education/In group clarification offered/Needs additional education.   Clinical Observations/Feedback: Pt was bright and engaged throughout group.  Pt identified his triggers as "being ignored, testing my patience, loneliness, not managing things well, being talked about, nagging, playing me for dumb and trying to get over on me".  Pt listed his coping skills as boxing, singing songs, meditation, breathing exercises, writing poetry/songs and wrestling.  Pt expressed if he used his positive coping skills moving forward he would "avoid more things than I get into and stay out of trouble".  Pt asked what should he do if none of his coping skills worked.  LRT encouraged pt to try new things and add them to his list of coping skills that way when one doesn't work, he has more options to pull from.  Pt was receptive to the advise.  Pt also did one of his songs for the group.   Caroll RancherMarjette Enis Leatherwood, LRT/CTRS        Caroll RancherLindsay, Jayse Hodkinson A 01/14/2016 11:19 AM

## 2016-01-14 NOTE — [facility] Suicide Risk Assessment (Signed)
[facility]   Nursing information obtained from:  Patient Demographic factors:  Male Current Mental Status:  Self-harm thoughts Loss Factors:  Financial problems / change in socioeconomic status Historical Factors:  NA Risk Reduction Factors:  Positive social support  Total Time spent with patient: 45 minutes Principal Problem:  Schizoaffective Disorder, Cocaine Abuse  Diagnosis:   Patient Active Problem List   Diagnosis Date Noted  . Schizoaffective disorder, bipolar type (HCC) [F25.0] 01/13/2016  . Cocaine abuse [F14.10] 09/22/2014    Continued Clinical Symptoms:  Alcohol Use Disorder Identification Test Final Score (AUDIT): 0 The "Alcohol Use Disorders Identification Test", Guidelines for Use in Primary Care, Second Edition.  World Science writerHealth Organization [facility](WHO). Score between 0-7:  no or low risk or alcohol related problems. Score between 8-15:  moderate risk of alcohol related problems. Score between 16-19:  high risk of alcohol related problems. Score 20 or above:  warrants further diagnostic evaluation for alcohol dependence and treatment.   CLINICAL FACTORS:  25 year old male, presented to ED after going to neighbor's house and reporting auditory hallucinations. States hallucinations have been worsening and sound " more real " recently. He also reported suicidal and homicidal ideations. He has a history of mental illness and in the past has been diagnosed with Bipolar Disorder and with Schizophrenia. He states he had been on Effexor XR and Risperidone in the past with good response but had stopped taking medications close to a year ago. Reports cannabis and cocaine use, and admission UDS is positive for these substances. Admission BAL negative.     Musculoskeletal: Strength & Muscle Tone: within normal limits Gait & Station: normal Patient leans: N/A  Psychiatric Specialty Exam: Physical Exam  ROS denies chest pain, no shortness of breath, no  nausea or vomiting   Blood pressure 101/70, pulse 83, temperature 99.4 F (37.4 C), temperature source Oral, resp. rate 16, height 5\' 8"  (1.727 m), weight 71.7 kg (158 lb), SpO2 100 %.Body mass index is 24.02 kg/m.  General Appearance: Fairly Groomed  Eye Contact:  Good  Speech:  Normal Rate  Volume:  Normal  Mood:  Anxious and Depressed  Affect:  blunted, vaguely guarded   Thought Process:  Linear  Orientation:  Other:  fully alert and attentive   Thought Content:  describes auditory hallucinations, no delusions described, at this time not internally preoccupied   Suicidal Thoughts:  No currently denies any suicidal ideations and denies any self injurious ideations, contracts for safety on unit   Homicidal Thoughts:  No denies any homicidal or violent ideations at this time   Memory:  recent and remote grossly intact   Judgement:  Fair  Insight:  Fair  Psychomotor Activity:  Normal  Concentration:  Concentration: Good and Attention Span: Good  Recall:  Good  Fund of Knowledge:  Good  Language:  Good  Akathisia:  Negative  Handed:  Right  AIMS (if indicated):     Assets:  Communication Skills Desire for Improvement Resilience  ADL's:  Intact  Cognition:  WNL  Sleep:  Number of Hours: 6.75      COGNITIVE FEATURES THAT CONTRIBUTE TO RISK:  Closed-mindedness and Loss of executive function    SUICIDE RISK:   Moderate   PLAN OF CARE: Patient will be admitted to inpatient psychiatric unit for stabilization and safety. Will provide and encourage milieu participation. Provide medication management and maked adjustments as needed.  Will follow daily.    I certify that inpatient services furnished  can reasonably be expected to improve the patient's condition.  Nehemiah MassedOBOS, Saanya Zieske, MD 01/14/2016, 5:50 PM

## 2016-01-14 NOTE — H&P (Signed)
Psychiatric Admission Assessment Adult  Patient Identification: Howard Carlson  MRN:  [id number]  Date of Evaluation:  01/14/2016  Chief Complaint:  bipolar affective disorder schizophrenia cannibas use disorder  Principal Diagnosis: Schizoaffective disorder, Bipolar-type Diagnosis:   Patient Active Problem List   Diagnosis Date Noted  . Schizoaffective disorder, bipolar type ([place]) [F25.0] 01/13/2016  . Cocaine abuse [F14.10] 09/22/2014   History of Present Illness: This is the first admission assessment for this 25 year old African-American male in this hospital. Admitted to the [facility] adult unit from the [facility] with complaints of auditory hallucinations. He was in need of mood stabilization treatments. During this assessment, Howard Carlson reports, "The cops took me to the [facility] ED 3 days ago. My neighbored called them. I was a little out of it on that day because I was seeing & hearing things. I had wandered into my neighbors house. He asked me if I was okay, that was when I blacked out. He called 911 for me. I started hearing & seeing stuff since age 47. That was when I started the attempts to kill myself by hanging, cutting & overdose. I have been on medication for mental health issues. I was being seen at the [facility] in [place], [place]. I stopped taking my mental health medications about 1 year ago. I was doing well on those medications. I will need to get back on those medicines".  Associated Signs/Symptoms: Depression Symptoms:  depressed mood, insomnia, anxiety,  (Hypo) Manic Symptoms:  Hallucinations, Labiality of Mood,  Anxiety Symptoms:  Excessive Worry,  Psychotic Symptoms:  Hallucinations: Auditory Visual  PTSD Symptoms: Denies any PTSD symptoms or events.  Total Time spent with patient: 1 hour  Past Psychiatric History: Schizophrenia  Is the patient at risk to self? Yes.    Has the patient been a risk to self in the past 6 months? No.  Has  the patient been a risk to self within the distant past? Yes.    Is the patient a risk to others? No.  Has the patient been a risk to others in the past 6 months? No.per patients reports  Has the patient been a risk to others within the distant past? Yes.   per patient's reports.  Prior Inpatient Therapy: Patient denies Prior Outpatient Therapy: Yes, ([facility] in [place]).  Alcohol Screening: Patient refused Alcohol Screening Tool: Yes 1. How often do you have a drink containing alcohol?: Never 9. Have you or someone else been injured as a result of your drinking?: No 10. Has a relative or friend or a doctor or another health worker been concerned about your drinking or suggested you cut down?: No Alcohol Use Disorder Identification Test Final Score (AUDIT): 0 Brief Intervention: Patient declined brief intervention  Substance Abuse History in the last 12 months:  Yes.    Consequences of Substance Abuse: Medical Consequences:  Liver damage, Possible death by overdose Legal Consequences:  Arrests, jail time, Loss of driving privilege. Family Consequences:  Family discord, divorce and or separation.  Previous Psychotropic Medications: Yes   Psychological Evaluations: Yes   Past Medical History:  Past Medical History:  Diagnosis Date  . Anxiety   . Bipolar affective ([place])   . Schizophrenic disorder ([place])    History reviewed. No pertinent surgical history.  Family History: History reviewed. No pertinent family history.  Family Psychiatric  History: Mental illness: Great, great grand-father.  Tobacco Screening: Have you used any form of tobacco in the last 30 days? (  Cigarettes, Smokeless Tobacco, Cigars, and/or Pipes): Yes Tobacco use, Select all that apply: 4 or less cigarettes per day Are you interested in Tobacco Cessation Medications?: Yes, will notify MD for an order Counseled patient on smoking cessation including recognizing danger situations, developing coping  skills and basic information about quitting provided: Yes Social History:  History  Alcohol Use No    Comment: Patient denies     History  Drug Use  . Types: Marijuana, "Crack" cocaine    Additional Social History: Separated, when?: 6 months ago What types of issues is patient dealing with in the relationship?: "When I talk to her on the phone, I hear other people's voices, and I think she is with other men, but I know it just hallucinations." Does patient have children?: Yes How many children?: 4 How is patient's relationship with their children?: 2 with his wife from whom he is separated,    Allergies:   Allergies  Allergen Reactions  . Amoxicillin Other (See Comments)    Has patient had a PCN reaction causing immediate rash, facial/tongue/throat swelling, SOB or lightheadedness with hypotension: Yes Has patient had a PCN reaction causing severe rash involving mucus membranes or skin necrosis: Yes Has patient had a PCN reaction that required hospitalization: Yes Has patient had a PCN reaction occurring within the last 10 years: No f all of the above answers are "NO", then may proceed with Cephalosporin use.    Lab Results:  Results for orders placed or performed during the hospital encounter of 01/12/16 (from the past 48 hour(s))  Comprehensive metabolic panel     Status: Abnormal   Collection Time: 01/12/16 12:00 PM  Result Value Ref Range   Sodium 138 135 - 145 mmol/L   Potassium 4.0 3.5 - 5.1 mmol/L   Chloride 106 101 - 111 mmol/L   CO2 26 22 - 32 mmol/L   Glucose, Bld 75 65 - 99 mg/dL   BUN 19 6 - 20 mg/dL   Creatinine, Ser 1.21 0.61 - 1.24 mg/dL   Calcium 9.1 8.9 - 10.3 mg/dL   Total Protein 7.5 6.5 - 8.1 g/dL   Albumin 4.0 3.5 - 5.0 g/dL   AST 20 15 - 41 U/L   ALT 16 (L) 17 - 63 U/L   Alkaline Phosphatase 53 38 - 126 U/L   Total Bilirubin 0.6 0.3 - 1.2 mg/dL   GFR calc non Af Amer >60 >60 mL/min   GFR calc Af Amer >60 >60 mL/min    Comment: (NOTE) The eGFR  has been calculated using the CKD EPI equation. This calculation has not been validated in all clinical situations. eGFR's persistently <60 mL/min signify possible Chronic Kidney Disease.    Anion gap 6 5 - 15  Ethanol     Status: None   Collection Time: 01/12/16 12:00 PM  Result Value Ref Range   Alcohol, Ethyl (B) <5 <5 mg/dL    Comment:        LOWEST DETECTABLE LIMIT FOR SERUM ALCOHOL IS 5 mg/dL FOR MEDICAL PURPOSES ONLY   Salicylate level     Status: None   Collection Time: 01/12/16 12:00 PM  Result Value Ref Range   Salicylate Lvl <6.1 2.8 - 30.0 mg/dL  Acetaminophen level     Status: Abnormal   Collection Time: 01/12/16 12:00 PM  Result Value Ref Range   Acetaminophen (Tylenol), Serum <10 (L) 10 - 30 ug/mL    Comment:        THERAPEUTIC CONCENTRATIONS VARY SIGNIFICANTLY.  A RANGE OF 10-30 ug/mL MAY BE AN EFFECTIVE CONCENTRATION FOR MANY PATIENTS. HOWEVER, SOME ARE BEST TREATED AT CONCENTRATIONS OUTSIDE THIS RANGE. ACETAMINOPHEN CONCENTRATIONS >150 ug/mL AT 4 HOURS AFTER INGESTION AND >50 ug/mL AT 12 HOURS AFTER INGESTION ARE OFTEN ASSOCIATED WITH TOXIC REACTIONS.   CBC with Differential     Status: None   Collection Time: 01/12/16 12:00 PM  Result Value Ref Range   WBC 5.1 4.0 - 10.5 K/uL   RBC 4.95 4.22 - 5.81 MIL/uL   Hemoglobin 14.5 13.0 - 17.0 g/dL   HCT 41.3 39.0 - 52.0 %   MCV 83.4 78.0 - 100.0 fL   MCH 29.3 26.0 - 34.0 pg   MCHC 35.1 30.0 - 36.0 g/dL   RDW 13.3 11.5 - 15.5 %   Platelets 204 150 - 400 K/uL   Neutrophils Relative % 55 %   Neutro Abs 2.8 1.7 - 7.7 K/uL   Lymphocytes Relative 36 %   Lymphs Abs 1.8 0.7 - 4.0 K/uL   Monocytes Relative 6 %   Monocytes Absolute 0.3 0.1 - 1.0 K/uL   Eosinophils Relative 2 %   Eosinophils Absolute 0.1 0.0 - 0.7 K/uL   Basophils Relative 1 %   Basophils Absolute 0.0 0.0 - 0.1 K/uL  Urinalysis, Routine w reflex microscopic     Status: Abnormal   Collection Time: 01/12/16 12:00 PM  Result Value Ref Range    Color, Urine YELLOW YELLOW   APPearance HAZY (A) CLEAR   Specific Gravity, Urine 1.029 1.005 - 1.030   pH 6.0 5.0 - 8.0   Glucose, UA NEGATIVE NEGATIVE mg/dL   Hgb urine dipstick NEGATIVE NEGATIVE   Bilirubin Urine NEGATIVE NEGATIVE   Ketones, ur 5 (A) NEGATIVE mg/dL   Protein, ur NEGATIVE NEGATIVE mg/dL   Nitrite NEGATIVE NEGATIVE   Leukocytes, UA MODERATE (A) NEGATIVE   RBC / HPF 0-5 0 - 5 RBC/hpf   WBC, UA TOO NUMEROUS TO COUNT 0 - 5 WBC/hpf   Bacteria, UA RARE (A) NONE SEEN   Squamous Epithelial / LPF NONE SEEN NONE SEEN   Mucous PRESENT   Urine rapid drug screen (hosp performed)     Status: Abnormal   Collection Time: 01/12/16 12:00 PM  Result Value Ref Range   Opiates NONE DETECTED NONE DETECTED   Cocaine POSITIVE (A) NONE DETECTED   Benzodiazepines NONE DETECTED NONE DETECTED   Amphetamines NONE DETECTED NONE DETECTED   Tetrahydrocannabinol POSITIVE (A) NONE DETECTED   Barbiturates NONE DETECTED NONE DETECTED    Comment:        DRUG SCREEN FOR MEDICAL PURPOSES ONLY.  IF CONFIRMATION IS NEEDED FOR ANY PURPOSE, NOTIFY LAB WITHIN 5 DAYS.        LOWEST DETECTABLE LIMITS FOR URINE DRUG SCREEN Drug Class       Cutoff (ng/mL) Amphetamine      1000 Barbiturate      200 Benzodiazepine   253 Tricyclics       664 Opiates          300 Cocaine          300 THC              50     Blood Alcohol level:  Lab Results  Component Value Date   ETH <5 01/12/2016   ETH <5 40/34/7425   Metabolic Disorder Labs:  No results found for: HGBA1C, MPG No results found for: PROLACTIN No results found for: CHOL, TRIG, HDL, CHOLHDL, VLDL, LDLCALC  Current Medications:  Current Facility-Administered Medications  Medication Dose Route Frequency Provider Last Rate Last Dose  . acetaminophen (TYLENOL) tablet 650 mg  650 mg Oral Q6H PRN Patrecia Pour, NP      . alum & mag hydroxide-simeth (MAALOX/MYLANTA) 200-200-20 MG/5ML suspension 30 mL  30 mL Oral Q4H PRN Patrecia Pour, NP      .  feeding supplement (ENSURE ENLIVE) (ENSURE ENLIVE) liquid 237 mL  237 mL Oral BID BM Jenne Campus, MD   237 mL at 01/14/16 0913  . hydrOXYzine (ATARAX/VISTARIL) tablet 25 mg  25 mg Oral Q6H PRN Laverle Hobby, PA-C      . magnesium hydroxide (MILK OF MAGNESIA) suspension 30 mL  30 mL Oral Daily PRN Patrecia Pour, NP   30 mL at 01/14/16 0913  . risperiDONE (RISPERDAL) tablet 2 mg  2 mg Oral Daily Patrecia Pour, NP   2 mg at 01/14/16 0913  . traZODone (DESYREL) tablet 50 mg  50 mg Oral QHS,MR X 1 Spencer E Simon, PA-C       PTA Medications: No prescriptions prior to admission.   Musculoskeletal: Strength & Muscle Tone: within normal limits Gait & Station: normal Patient leans: N/A  Psychiatric Specialty Exam: Physical Exam  Constitutional: He is oriented to person, place, and time. He appears well-developed.  HENT:  Head: Normocephalic.  Eyes: Pupils are equal, round, and reactive to light.  Neck: Normal range of motion.  Cardiovascular: Normal rate.   Respiratory: Effort normal.  GI: Soft.  Genitourinary:  Genitourinary Comments: Deferred  Musculoskeletal: Normal range of motion.  Neurological: He is alert and oriented to person, place, and time.  Skin: Skin is warm and dry.    Review of Systems  Constitutional: Negative.   HENT: Negative.   Eyes: Negative.   Respiratory: Negative.   Cardiovascular: Negative.   Gastrointestinal: Negative.   Genitourinary: Negative.   Musculoskeletal: Negative.   Skin: Negative.   Neurological: Negative.   Endo/Heme/Allergies: Negative.   Psychiatric/Behavioral: Positive for depression, hallucinations (Auditory hallucinations) and substance abuse (Cocaine & THC dependence). Negative for memory loss and suicidal ideas. The patient is nervous/anxious and has insomnia.     Blood pressure 101/70, pulse 83, temperature 99.4 F (37.4 C), temperature source Oral, resp. rate 16, height _0  (1.727 m), weight 71.7 kg (158 lb), SpO2 100  %.Body mass index is 24.02 kg/m.  General Appearance: Casual and Fairly Groomed  Eye Contact:  Good  Speech:  Clear and Coherent and Normal Rate  Volume:  Normal  Mood:  Reports being depressed & anxious  Affect:  Appropriate  Thought Process:  Coherent and Goal Directed  Orientation:  Full (Time, Place, and Person)  Thought Content:  Hallucinations: Auditory and Rumination  Suicidal Thoughts:  Currently denies any siucidal thoughts  Homicidal Thoughts:  Denies any homicidal thoughts  Memory:  Immediate;   Fair Recent;   Fair Remote;   Good  Judgement:  Impaired  Insight:  Fair  Psychomotor Activity:  Normal  Concentration:  Concentration: Good and Attention Span: Good  Recall:  [employer] of Knowledge:  Fair  Language:  Good  Akathisia:  Negative  Handed:  Right  AIMS (if indicated):     Assets:  Communication Skills Desire for Improvement Physical Health  ADL's:  Intact  Cognition:  WNL  Sleep:  Number of Hours: 6.75   Treatment Plan/Recommendations: 1. Admit for crisis management and stabilization, estimated length of stay 3-5 days.  2. Medication management to  reduce current symptoms to base line and improve the patient's overall level of functioning  3. Treat health problems as indicated.  4. Develop treatment plan to decrease risk of relapse upon discharge and the need for readmission.  5. Psycho-social education regarding relapse prevention and self care.  6. Health care follow up as needed for medical problems.  7. Review, reconcile, and reinstate any pertinent home medications for other health issues where appropriate. 8. Call for consults with hospitalist for any additional specialty patient care services as needed.  Observation Level/Precautions:  15 minute checks  Laboratory:  Per ED, UDS + for Cocaine & THC, will obtain Prolactin level, HGBA1C, Lipid panel, EKG  Psychotherapy: Group sessions    Medications: See MAR    Consultations: As needed    Discharge  Concerns: Mood stability   Estimated LOS: 5-7 days  Other: Admit to [address]Hall     Physician Treatment Plan for Primary Diagnosis: Will continue Risperdal 2 mg for mood control, Hydroxyzine 25 mg prn for anxiety, Effexor XR 37.5 mg for depression &Trazodone 50 mg for insomnia.  Long Term Goal(s): Improvement in symptoms so as ready for discharge  Short Term Goals: Ability to identify changes in lifestyle to reduce recurrence of condition will improve, Ability to demonstrate self-control will improve and Ability to identify and develop effective coping behaviors will improve  Physician Treatment Plan for Secondary Diagnosis: Active Problems:   Schizoaffective disorder, bipolar type ([place])  Long Term Goal(s): Improvement in symptoms so as ready for discharge  Short Term Goals: Ability to identify changes in lifestyle to reduce recurrence of condition will improve and Compliance with prescribed medications will improve  I certify that inpatient services furnished can reasonably be expected to improve the patient's condition.    Encarnacion Slates, NP, PMHNP, FNP-BC 12/21/201711:24 AM   I have discussed case with NP and have met with patient Agree with NP note and assessment  25 year old male, presented to ED after going to neighbor's house and reporting auditory hallucinations. States hallucinations have been worsening and sound " more real " recently. He also reported suicidal and homicidal ideations. He has a history of mental illness and in the past has been diagnosed with Bipolar Disorder and with Schizophrenia. He states he had been on Effexor XR and Risperidone in the past with good response but had stopped taking medications close to a year ago. Reports cannabis and cocaine use, and admission UDS is positive for these substances. Admission BAL negative.

## 2016-01-14 NOTE — Progress Notes (Signed)
Recreation Therapy Notes  INPATIENT RECREATION THERAPY ASSESSMENT  Patient Details Name: Howard PhillipsDeontae Carlson MRN: [id number] DOB: 1990-06-28 Today's Date: 01/14/2016  Patient Stressors: Family, Relationship, Friends, Work ("My condition keeps me from keeping a job".)  Pt stated he was here for hearing voices, seeing things, suicidal thoughts and homicidal thoughts.  Coping Skills:   Isolate, Arguments, Substance Abuse, Avoidance, Self-Injury, Music, Sports  Personal Challenges: Anger, Communication, Concentration, Decision-Making, Expressing Yourself, Problem-Solving, Relationships, Self-Esteem/Confidence, Social Interaction, Stress Management, Substance Abuse, Time Management, Trusting Others, Work Nutritional therapisterformance  Leisure Interests (2+):  Music - Listen, Music - Write music  Awareness of Community Resources:  Yes  Community Resources:  Library, Other (Comment) Quarry manager(Playground)  Current Use: No  If no, Barriers?: Other (Comment) (Library geared toward kids)  Patient Strengths:  Good listener; Give honest feedback  Patient Identified Areas of Improvement:  Seeking help; Staying on medications  Current Recreation Participation:  "Little to none"  Patient Goal for Hospitalization:  "Stay on medications even when I leave here"  [place]  [place]  Current [place] (including self-harm):  No  Current HI:  No  Consent to Intern Participation: N/A   Caroll RancherMarjette Zoee Heeney, LRT/CTRS  Caroll RancherLindsay, Aluna Whiston A 01/14/2016, 11:56 AM

## 2016-01-15 LAB — LIPID PANEL
CHOL/HDL RATIO: 3 ratio
CHOLESTEROL: 143 mg/dL (ref 0–200)
HDL: 47 mg/dL (ref >40)
LDL Cholesterol: 78 mg/dL (ref 0–99)
Triglycerides: 91 mg/dL (ref <150)
VLDL: 18 mg/dL (ref 0–40)

## 2016-01-15 MED ORDER — TRAZODONE 25 MG HALF TABLET
75.0000 mg | ORAL_TABLET | Freq: Every day | ORAL | Status: DC
Start: 2016-01-15 — End: 2016-01-18
  Administered 2016-01-15 – 2016-01-17 (×3): 75 mg via ORAL
  Filled 2016-01-15 (×6): qty 1

## 2016-01-15 MED ORDER — IBUPROFEN 600 MG PO TABS
600.0000 mg | ORAL_TABLET | Freq: Three times a day (TID) | ORAL | Status: DC | PRN
  Administered 2016-01-15 – 2016-01-20 (×7): 600 mg via ORAL
  Filled 2016-01-15 (×7): qty 1

## 2016-01-15 NOTE — Progress Notes (Signed)
[facility] MD Progress Note  01/15/2016 3:37 PM Howard Carlson  MRN:  [id number] Subjective:  Patient states he is feeling better, and states that auditory hallucinations he had been experiencing prior to admission have abated . Denies medication side effects. Objective : I have met with patient along with RN staff and have discussed case with team. Patient presents alert,attentive, calm. Describes mood as improved and states hallucinations are decreased but not completely resolved. He does not appear to be internally preoccupied at this time. Minimizes depression at this time, and presents with a reactive affect. He has been visible on unit, going to some groups, no agitated or disruptive behaviors. Tolerating medications well . Labs- Of note, UA is positive for leukocytes, but rare bacteria. Denies any symptoms of dysuria, urgency, discharge. Of note, RN reports wife called and stated she had recently been diagnosed with Chlamydia. Lipid profile unremarkable  Principal Problem: Psychosis, Cocaine / Cannabis Abuse  Diagnosis:   Patient Active Problem List   Diagnosis Date Noted  . Schizoaffective disorder, bipolar type ([place]) [F25.0] 01/13/2016  . Cocaine abuse [F14.10] 09/22/2014   Total Time spent with patient: 20 minutes  Past Medical History:  Past Medical History:  Diagnosis Date  . Anxiety   . Bipolar affective ([place])   . Schizophrenic disorder ([place])    History reviewed. No pertinent surgical history. Family History: History reviewed. No pertinent family history.  Social History:  History  Alcohol Use No    Comment: Patient denies     History  Drug Use  . Types: Marijuana, "Crack" cocaine    Social History   Social History  . Marital status: Married    Spouse name: N/A  . Number of children: N/A  . Years of education: N/A   Social History Main Topics  . Smoking status: Light Tobacco Smoker    Types: Cigarettes  . Smokeless tobacco: Never Used  . Alcohol use No     Comment: Patient denies  . Drug use:     Types: Marijuana, "Crack" cocaine  . Sexual activity: Yes   Other Topics Concern  . None   Social History Narrative  . None   Additional Social History:   Sleep: fair, states he slept only fairly .  Appetite:  Good  Current Medications: Current Facility-Administered Medications  Medication Dose Route Frequency Provider Last Rate Last Dose  . acetaminophen (TYLENOL) tablet 650 mg  650 mg Oral Q6H PRN Patrecia Pour, NP      . alum & mag hydroxide-simeth (MAALOX/MYLANTA) 200-200-20 MG/5ML suspension 30 mL  30 mL Oral Q4H PRN Patrecia Pour, NP      . feeding supplement (ENSURE ENLIVE) (ENSURE ENLIVE) liquid 237 mL  237 mL Oral BID BM Jenne Campus, MD   237 mL at 01/15/16 0946  . hydrOXYzine (ATARAX/VISTARIL) tablet 25 mg  25 mg Oral Q6H PRN Laverle Hobby, PA-C      . magnesium hydroxide (MILK OF MAGNESIA) suspension 30 mL  30 mL Oral Daily PRN Patrecia Pour, NP   30 mL at 01/14/16 0913  . risperiDONE (RISPERDAL) tablet 2 mg  2 mg Oral Daily Patrecia Pour, NP   2 mg at 01/15/16 9211  . traZODone (DESYREL) tablet 75 mg  75 mg Oral QHS Myer Peer Cobos, MD      . venlafaxine XR (EFFEXOR-XR) 24 hr capsule 37.5 mg  37.5 mg Oral Q breakfast Encarnacion Slates, NP   37.5 mg at 01/15/16 [phone]  Lab Results:  Results for orders placed or performed during the hospital encounter of 01/13/16 (from the past 48 hour(s))  Lipid panel     Status: None   Collection Time: 01/15/16  6:27 AM  Result Value Ref Range   Cholesterol 143 0 - 200 mg/dL   Triglycerides 91 <150 mg/dL   HDL 47 >40 mg/dL   Total CHOL/HDL Ratio 3.0 RATIO   VLDL 18 0 - 40 mg/dL   LDL Cholesterol 78 0 - 99 mg/dL    Comment:        Total Cholesterol/HDL:CHD Risk Coronary Heart Disease Risk Table                     Men   Women  1/2 Average Risk   3.4   3.3  Average Risk       5.0   4.4  2 X Average Risk   9.6   7.1  3 X Average Risk  23.4   11.0        Use the calculated  Patient Ratio above and the CHD Risk Table to determine the patient's CHD Risk.        ATP III CLASSIFICATION (LDL):  <100     mg/dL   Optimal  100-129  mg/dL   Near or Above                    Optimal  130-159  mg/dL   Borderline  160-189  mg/dL   High  >190     mg/dL   Very High Performed at [facility]     Blood Alcohol level:  Lab Results  Component Value Date   [facility] <5 01/12/2016   ETH <5 53/66/4403    Metabolic Disorder Labs: No results found for: HGBA1C, MPG No results found for: PROLACTIN Lab Results  Component Value Date   CHOL 143 01/15/2016   TRIG 91 01/15/2016   HDL 47 01/15/2016   CHOLHDL 3.0 01/15/2016   VLDL 18 01/15/2016   LDLCALC 78 01/15/2016    Physical Findings: AIMS: Facial and Oral Movements Muscles of Facial Expression: None, normal Lips and Perioral Area: None, normal Jaw: None, normal Tongue: None, normal,Extremity Movements Upper (arms, wrists, hands, fingers): None, normal Lower (legs, knees, ankles, toes): None, normal, Trunk Movements Neck, shoulders, hips: None, normal, Overall Severity Severity of abnormal movements (highest score from questions above): None, normal Incapacitation due to abnormal movements: None, normal Patient's awareness of abnormal movements (rate only patient's report): No Awareness, Dental Status Current problems with teeth and/or dentures?: No Does patient usually wear dentures?: No  CIWA:    COWS:     Musculoskeletal: Strength & Muscle Tone: within normal limits Gait & Station: normal Patient leans: N/A  Psychiatric Specialty Exam: Physical Exam  ROS no headache, no chest pain, no shortness of breath, denies dysuria, hematuria, urgency, no fever , no chills   Blood pressure 102/86, pulse 81, temperature 98 F (36.7 C), temperature source Oral, resp. rate 18, height '5\' 8"'$  (1.727 m), weight 71.7 kg (158 lb), SpO2 100 %.Body mass index is 24.02 kg/m.  General Appearance: Well Groomed  Eye  Contact:  Good  Speech:  Normal Rate  Volume:  Normal  Mood:  improving mood   Affect:  appropriate, more reactive   Thought Process:  Better organized, more linear  Orientation:  Full (Time, Place, and Person)  Thought Content: reports improved but not resolved auditory hallucinations, does not appear  internally preoccupied   Suicidal Thoughts:  No denies suicidal or self injurious ideations at this time, denies any homicidal or violent ideations  Homicidal Thoughts:  No  Memory:  recent and remote grossly intact   Judgement:  Other:  improving   Insight:  improving   Psychomotor Activity:  Normal  Concentration:  Concentration: Good and Attention Span: Good  Recall:  Good  Fund of Knowledge:  Good  Language:  Good  Akathisia:  Negative  Handed:  Right  AIMS (if indicated):     Assets:  Communication Skills Desire for Improvement Resilience  ADL's:  Intact  Cognition:  WNL  Sleep:  Number of Hours: 5.75   Assessment - patient presents with improvement compared to admission - improving mood and range of affect, decreased auditory hallucinations, and does not appear internally preoccupied. Denies SI or HI. Tolerating medications well at present. UA is remarkable for WBC- wife has reported recent diagnosis of chlamydia. No associated symptoms reported by patient   Treatment Plan Summary: Daily contact with patient to assess and evaluate symptoms and progress in treatment, Medication management, Plan inpatient treatment  and medications as below  Encourage ongoing group and milieu participation to work on coping skills and symptom reduction Encourage efforts to maintain abstinence from illegal drugs/cocaine. Continue  Risperidone 2 mgrs QHS for psychosis Continue Effexor XR 37.5 mgrs QDAY for depression Increase Trazodone to  75 mgrs QHS for insomnia Continue Vistaril 25 mgrs Q 6 hours PRN for anxiety as needed  As requested by patient, will order STD work up Treatment team  working on disposition planning options Neita Garnet, MD 01/15/2016, 3:37 PM

## 2016-01-15 NOTE — Progress Notes (Signed)
Recreation Therapy Notes  Date: 01/15/16 Time: 1000 Location: 500 Hall Dayroom  Group Topic: Self-Expression  Goal Area(s) Addresses:  Patient will identify positive ways to express themselves. Patient will verbalize benefit of increased self-expression.  Behavioral Response: Engaged  Intervention: Markers, Holiday representativeconstruction paper  Activity:  Holiday Cards.  Patients were given markers and construction paper to create a holiday card for themselves or someone special to them.  Education:  Self-Expression, Building control surveyorDischarge Planning.   Education Outcome: Acknowledges education/In group clarification offered/Needs additional education  Clinical Observations/Feedback: Pt was bright and active during group.  Pt wrote a Christmas poem on his card expressing how thankful he is for what he has and how he won't let his obstacles hold him back from accomplishing the things he wants to achieve in life.   Caroll RancherMarjette Noelene Gang, LRT/CTRS        Caroll RancherLindsay, Anaijah Augsburger A 01/15/2016 11:47 AM

## 2016-01-15 NOTE — Progress Notes (Signed)
DAR Note  Pt is flat isolative and withdrawn to room: Pt was in bed with eyes closed all night. Pt was able to denied depression, anxiety, pain, SI, HI or AVH by saying "no" when asked about each. At the time of assessment did not look to be in any distress. Support, encouragement, and safe environment provided.  15-minute safety checks continue. 2200 sleep medication was held as Pt was difficult to arouse.

## 2016-01-15 NOTE — [facility] Group Notes (Signed)
[facility] LCSW Group Therapy  01/15/2016  1:05 PM  Type of Therapy:  Group therapy  Participation Level:  Active  Participation Quality:  Attentive  Affect:  Flat  Cognitive:  Oriented  Insight:  Limited  Engagement in Therapy:  Limited  Modes of Intervention:  Discussion, Socialization  Summary of Progress/Problems:  Chaplain was here to lead a group on themes of hope and courage. "Hope is the thing that keeps me going in a positive direction.  It's easy to get distracted by the negative.  If you are in the eye of the hurricane, you need hope to keep going because you know the worst has not come yet."  Stated the source of his hope is God, children and self.  Daryel Geraldorth, Malak Orantes B 01/15/2016 1:21 PM

## 2016-01-15 NOTE — Progress Notes (Signed)
Patient reported to nurse that his wife reported to him that she has active chlamydia. He requested, "An antibiotic". MD notified.

## 2016-01-15 NOTE — Tx Team (Signed)
Interdisciplinary Treatment and Diagnostic Plan Update  01/15/2016 Time of Session: 1:36 PM  Howard Carlson MRN: [id number]  Principal Diagnosis: <principal problem not specified>  Secondary Diagnoses: Active Problems:   Schizoaffective disorder, bipolar type ([facility])   Current Medications:  Current Facility-Administered Medications  Medication Dose Route Frequency Provider Last Rate Last Dose  . acetaminophen (TYLENOL) tablet 650 mg  650 mg Oral Q6H PRN Patrecia Pour, NP      . alum & mag hydroxide-simeth (MAALOX/MYLANTA) 200-200-20 MG/5ML suspension 30 mL  30 mL Oral Q4H PRN Patrecia Pour, NP      . feeding supplement (ENSURE ENLIVE) (ENSURE ENLIVE) liquid 237 mL  237 mL Oral BID BM Jenne Campus, MD   237 mL at 01/15/16 0946  . hydrOXYzine (ATARAX/VISTARIL) tablet 25 mg  25 mg Oral Q6H PRN Laverle Hobby, PA-C      . magnesium hydroxide (MILK OF MAGNESIA) suspension 30 mL  30 mL Oral Daily PRN Patrecia Pour, NP   30 mL at 01/14/16 0913  . risperiDONE (RISPERDAL) tablet 2 mg  2 mg Oral Daily Patrecia Pour, NP   2 mg at 01/15/16 6503  . traZODone (DESYREL) tablet 75 mg  75 mg Oral QHS Myer Peer Cobos, MD      . venlafaxine XR (EFFEXOR-XR) 24 hr capsule 37.5 mg  37.5 mg Oral Q breakfast Encarnacion Slates, NP   37.5 mg at 01/15/16 5465    PTA Medications: No prescriptions prior to admission.    Treatment Modalities: Medication Management, Group therapy, Case management,  1 to 1 session with clinician, Psychoeducation, Recreational therapy.   Physician Treatment Plan for Primary Diagnosis: <principal problem not specified> Long Term Goal(s): Improvement in symptoms so as ready for discharge  Short Term Goals: Ability to identify changes in lifestyle to reduce recurrence of condition will improve Ability to demonstrate self-control will improve Ability to identify and develop effective coping behaviors will improve Ability to identify changes in lifestyle to reduce  recurrence of condition will improve Compliance with prescribed medications will improve  Medication Management: Evaluate patient's response, side effects, and tolerance of medication regimen.  Therapeutic Interventions: 1 to 1 sessions, Unit Group sessions and Medication administration.  Evaluation of Outcomes: Progressing  Physician Treatment Plan for Secondary Diagnosis: Active Problems:   Schizoaffective disorder, bipolar type ([place])   Long Term Goal(s): Improvement in symptoms so as ready for discharge  Short Term Goals: Ability to identify changes in lifestyle to reduce recurrence of condition will improve Ability to demonstrate self-control will improve Ability to identify and develop effective coping behaviors will improve Ability to identify changes in lifestyle to reduce recurrence of condition will improve Compliance with prescribed medications will improve  Medication Management: Evaluate patient's response, side effects, and tolerance of medication regimen.  Therapeutic Interventions: 1 to 1 sessions, Unit Group sessions and Medication administration.  Evaluation of Outcomes: Progressing   RN Treatment Plan for Primary Diagnosis: <principal problem not specified> Long Term Goal(s): Knowledge of disease and therapeutic regimen to maintain health will improve  Short Term Goals: Ability to identify and develop effective coping behaviors will improve and Compliance with prescribed medications will improve  Medication Management: RN will administer medications as ordered by provider, will assess and evaluate patient's response and provide education to patient for prescribed medication. RN will report any adverse and/or side effects to prescribing provider.  Therapeutic Interventions: 1 on 1 counseling sessions, Psychoeducation, Medication administration, Evaluate responses to treatment, Monitor vital signs and  CBGs as ordered, Perform/monitor CIWA, COWS, AIMS and Fall Risk  screenings as ordered, Perform wound care treatments as ordered.  Evaluation of Outcomes: Progressing   Recreational Therapy Treatment Plan for Primary Diagnosis: <principal problem not specified> Long Term Goal(s): LTG- Patient will participate in recreation therapy tx in at least 2 group sessions without prompting from LRT.  Short Term Goals:  Patient will be able to identify at least 5 coping skills for admitting dx by conclusion of recreation therapy tx.  Treatment Modalities: Group and Pet Therapy  Therapeutic Interventions: Psychoeducation  Evaluation of Outcomes: Progressing   LCSW Treatment Plan for Primary Diagnosis: <principal problem not specified> Long Term Goal(s): Safe transition to appropriate next level of care at discharge, Engage patient in therapeutic group addressing interpersonal concerns.  Short Term Goals: Engage patient in aftercare planning with referrals and resources  Therapeutic Interventions: Assess for all discharge needs, 1 to 1 time with Social worker, Explore available resources and support systems, Assess for adequacy in community support network, Educate family and significant other(s) on suicide prevention, Complete Psychosocial Assessment, Interpersonal group therapy.   Evaluation of Outcomes: Met  Pt will d/c to shelter, get further help from [facility] program, follow up outpatient clinic   Progress in Treatment: Attending groups: Yes Participating in groups: Yes Taking medication as prescribed: Yes Toleration medication: Yes, no side effects reported at this time Family/Significant other contact made: No for help with AH and "getting focused." Patient understands diagnosis: Yes AEB Discussing patient identified problems/goals with staff: Yes Medical problems stabilized or resolved: Yes Denies suicidal/homicidal ideation: Yes Issues/concerns per patient self-inventory: None Other: N/A  New problem(s) identified: None identified at this time.    New Short Term/Long Term Goal(s): None identified at this time.   Discharge Plan or Barriers:   Reason for Continuation of Hospitalization:  Depression Hallucinations  Medication stabilization   Estimated Length of Stay: 3-5 days  Attendees: Patient: 01/15/2016  1:36 PM  Physician: Neita Garnet MD 01/15/2016  1:36 PM  Nursing: Etheleen Mayhew, RN 01/15/2016  1:36 PM  RN Care Manager: Lars Pinks, RN 01/15/2016  1:36 PM  Social Worker: Ripley Fraise 01/15/2016  1:36 PM  Recreational Therapist: Laretta Bolster  01/15/2016  1:36 PM  Other: Norberto Sorenson 01/15/2016  1:36 PM  Other:  01/15/2016  1:36 PM    Scribe for Treatment Team:  Roque Lias 01/15/2016 1:36 PM

## 2016-01-15 NOTE — Progress Notes (Signed)
D: Pt A & O X3. Denies SI, HI, AVH and pain at this time. Presented with depressed affect and mood on initial contact, however, pt brightened up and became more interactive with peers and staff as shift progressed. C/O poor sleep last night, "I could not go to sleep, I did not know I have any sleeping pill". Pt has been cooperative with care and has been redirectable.  A: Emotional support and availability provided to pt. Medications administered as prescribed. Encouraged pt to voice concerns, comply with medications and attend groups. Informed pt of change to Trazodone order (see emar) and new lab orders (STDs). Urine sample collected for GC & Chlamydia. Q 15 minutes checks continues without self harm gestures or outburst.  R: Pt receptive to care. Took his medications when offered.  Denies adverse drug reactions at this time. Attended all unit groups as scheduled. Tolerates all PO intake well. Remains safe on and off unit. POC maintained for safety and mood stability.

## 2016-01-16 LAB — HEMOGLOBIN A1C
Hgb A1c MFr Bld: 5.6 % (ref 4.8–5.6)
Mean Plasma Glucose: 114 mg/dL

## 2016-01-16 LAB — PROLACTIN: PROLACTIN: 33.1 ng/mL — AB (ref 4.0–15.2)

## 2016-01-16 NOTE — Progress Notes (Addendum)
D: Pt is flat and withdrawn to self, makes minimal eye contact. Pt endorses severe anxiety with moderate depression. Pt also endorses command auditory and visual hallucination; states, "they are telling me to hurt other and myself."-Pt contracts for safety. Pt was very paranoid and delusional; states, "I have done something wrong to you or someone else? I just feel like I have." Pt also complained severe back pain from h/o intentional falls from rooftops. A: Medications offered as prescribed.  Support, encouragement, and safe environment provided.  15-minute safety checks continue. R: Pt refused all night meds.  Pt did not attend wrap up group. Safety checks continue.

## 2016-01-16 NOTE — [facility] Group Notes (Signed)
[facility] Group Notes: (Clinical Social Work)   01/16/2016      Type of Therapy:  Group Therapy   Participation Level:  Did Not Attend despite MHT prompting   Adedamola Seto Grossman-Orr, LCSW 01/16/2016, 2:34 PM     

## 2016-01-16 NOTE — Progress Notes (Signed)
DAR NOTE: Patient presents with anxious affect and depressed mood.  Denies pain, auditory and visual hallucinations.  Maintained on routine safety checks.  Medications given as prescribed.  Support and encouragement offered as needed.  Patient is isolative and withdrawn.  Offered no complaint.

## 2016-01-16 NOTE — Progress Notes (Deleted)
Pt refused wound dressing.

## 2016-01-16 NOTE — Progress Notes (Signed)
[facility] MD Progress Note  01/16/2016 12:40 PM Howard Carlson  MRN:  [id number]  Subjective: Howard Carlson reports, "I'm doing pretty good. Denies any hallucinations, delusional thoughts or paranoia. Denies any new issues. Denies medication side effects.  Objective: I have met with patient along with RN staff and have discussed case with team. Patient presents alert, attentive, calm. Describes mood as improved and states hallucinations are decreased but not completely resolved. He does not appear to be internally preoccupied at this time. Minimizes depression at this time, and presents with a reactive affect. He has been visible on unit, going to some groups, no agitated or disruptive behaviors. Tolerating medications well . Labs- Of note, UA is positive for leukocytes, but rare bacteria. Denies any symptoms of dysuria, urgency, discharge. Of note, RN reports wife called and stated she had recently been diagnosed with Chlamydia. STD panel ordered, result pending. Lipid profile unremarkable   Principal Problem: Psychosis, Cocaine / Cannabis Abuse   Diagnosis:   Patient Active Problem List   Diagnosis Date Noted  . Schizoaffective disorder, bipolar type ([place]) [F25.0] 01/13/2016  . Cocaine abuse [F14.10] 09/22/2014   Total Time spent with patient: 15 minutes  Past Medical History:  Past Medical History:  Diagnosis Date  . Anxiety   . Bipolar affective ([place])   . Schizophrenic disorder ([place])    History reviewed. No pertinent surgical history. Family History: History reviewed. No pertinent family history.  Social History:  History  Alcohol Use No    Comment: Patient denies     History  Drug Use  . Types: Marijuana, "Crack" cocaine    Social History   Social History  . Marital status: Married    Spouse name: N/A  . Number of children: N/A  . Years of education: N/A   Social History Main Topics  . Smoking status: Light Tobacco Smoker    Types: Cigarettes  . Smokeless tobacco:  Never Used  . Alcohol use No     Comment: Patient denies  . Drug use:     Types: Marijuana, "Crack" cocaine  . Sexual activity: Yes   Other Topics Concern  . None   Social History Narrative  . None   Additional Social History:   Sleep: fair, states he slept only fairly .  Appetite:  Good  Current Medications: Current Facility-Administered Medications  Medication Dose Route Frequency Provider Last Rate Last Dose  . acetaminophen (TYLENOL) tablet 650 mg  650 mg Oral Q6H PRN Patrecia Pour, NP      . alum & mag hydroxide-simeth (MAALOX/MYLANTA) 200-200-20 MG/5ML suspension 30 mL  30 mL Oral Q4H PRN Patrecia Pour, NP      . feeding supplement (ENSURE ENLIVE) (ENSURE ENLIVE) liquid 237 mL  237 mL Oral BID BM Myer Peer Cobos, MD   237 mL at 01/16/16 0827  . hydrOXYzine (ATARAX/VISTARIL) tablet 25 mg  25 mg Oral Q6H PRN Laverle Hobby, PA-C   25 mg at 01/15/16 1951  . ibuprofen (ADVIL,MOTRIN) tablet 600 mg  600 mg Oral TID PRN Benjamine Mola, FNP   600 mg at 01/15/16 2022  . magnesium hydroxide (MILK OF MAGNESIA) suspension 30 mL  30 mL Oral Daily PRN Patrecia Pour, NP   30 mL at 01/14/16 0913  . risperiDONE (RISPERDAL) tablet 2 mg  2 mg Oral Daily Patrecia Pour, NP   2 mg at 01/16/16 8828  . traZODone (DESYREL) tablet 75 mg  75 mg Oral QHS Jenne Campus, MD  75 mg at 01/15/16 2022  . venlafaxine XR (EFFEXOR-XR) 24 hr capsule 37.5 mg  37.5 mg Oral Q breakfast Encarnacion Slates, NP   37.5 mg at 01/16/16 1610    Lab Results:  Results for orders placed or performed during the hospital encounter of 01/13/16 (from the past 48 hour(s))  Prolactin     Status: Abnormal   Collection Time: 01/15/16  6:27 AM  Result Value Ref Range   Prolactin 33.1 (H) 4.0 - 15.2 ng/mL    Comment: (NOTE) Performed At: [facility] [place], [place] [id number] Lindon Romp MD [id number] Performed at [facility]   Hemoglobin A1c     Status: None    Collection Time: 01/15/16  6:27 AM  Result Value Ref Range   Hgb A1c MFr Bld 5.6 4.8 - 5.6 %    Comment: (NOTE)         Pre-diabetes: 5.7 - 6.4         Diabetes: >6.4         Glycemic control for adults with diabetes: <7.0    Mean Plasma Glucose 114 mg/dL    Comment: (NOTE) Performed At: [facility] [place], [place] [id number] Lindon Romp MD [id number] Performed at [facility]   Lipid panel     Status: None   Collection Time: 01/15/16  6:27 AM  Result Value Ref Range   Cholesterol 143 0 - 200 mg/dL   Triglycerides 91 <150 mg/dL   HDL 47 >40 mg/dL   Total CHOL/HDL Ratio 3.0 RATIO   VLDL 18 0 - 40 mg/dL   LDL Cholesterol 78 0 - 99 mg/dL    Comment:        Total Cholesterol/HDL:CHD Risk Coronary Heart Disease Risk Table                     Men   Women  1/2 Average Risk   3.4   3.3  Average Risk       5.0   4.4  2 X Average Risk   9.6   7.1  3 X Average Risk  23.4   11.0        Use the calculated Patient Ratio above and the CHD Risk Table to determine the patient's CHD Risk.        ATP III CLASSIFICATION (LDL):  <100     mg/dL   Optimal  100-129  mg/dL   Near or Above                    Optimal  130-159  mg/dL   Borderline  160-189  mg/dL   High  >190     mg/dL   Very High Performed at [facility]     Blood Alcohol level:  Lab Results  Component Value Date   [facility] <5 01/12/2016   ETH <5 44/01/270    Metabolic Disorder Labs: Lab Results  Component Value Date   HGBA1C 5.6 01/15/2016   MPG 114 01/15/2016   Lab Results  Component Value Date   PROLACTIN 33.1 (H) 01/15/2016   Lab Results  Component Value Date   CHOL 143 01/15/2016   TRIG 91 01/15/2016   HDL 47 01/15/2016   CHOLHDL 3.0 01/15/2016   VLDL 18 01/15/2016   LDLCALC 78 01/15/2016    Physical Findings: AIMS: Facial and Oral Movements Muscles of Facial Expression: None, normal Lips and Perioral Area:  None, normal Jaw: None,  normal Tongue: None, normal,Extremity Movements Upper (arms, wrists, hands, fingers): None, normal Lower (legs, knees, ankles, toes): None, normal, Trunk Movements Neck, shoulders, hips: None, normal, Overall Severity Severity of abnormal movements (highest score from questions above): None, normal Incapacitation due to abnormal movements: None, normal Patient's awareness of abnormal movements (rate only patient's report): No Awareness, Dental Status Current problems with teeth and/or dentures?: No Does patient usually wear dentures?: No  CIWA:    COWS:     Musculoskeletal: Strength & Muscle Tone: within normal limits Gait & Station: normal Patient leans: N/A  Psychiatric Specialty Exam: Physical Exam  ROS no headache, no chest pain, no shortness of breath, denies dysuria, hematuria, urgency, no fever , no chills   Blood pressure 129/75, pulse 94, temperature 98.7 F (37.1 C), resp. rate 16, height 5' 8"  (1.727 m), weight 71.7 kg (158 lb), SpO2 100 %.Body mass index is 24.02 kg/m.  General Appearance: Well Groomed  Eye Contact:  Good  Speech:  Normal Rate  Volume:  Normal  Mood:  improving mood   Affect:  appropriate, more reactive   Thought Process:  Better organized, more linear  Orientation:  Full (Time, Place, and Person)  Thought Content: reports improved but not resolved auditory hallucinations, does not appear internally preoccupied   Suicidal Thoughts:  No denies suicidal or self injurious ideations at this time, denies any homicidal or violent ideations  Homicidal Thoughts:  No  Memory:  recent and remote grossly intact   Judgement:  Other:  improving   Insight:  improving   Psychomotor Activity:  Normal  Concentration:  Concentration: Good and Attention Span: Good  Recall:  Good  Fund of Knowledge:  Good  Language:  Good  Akathisia:  Negative  Handed:  Right  AIMS (if indicated):     Assets:  Communication Skills Desire for Improvement Resilience  ADL's:   Intact  Cognition:  WNL  Sleep:  Number of Hours: 6.5   Assessment - patient presents with improvement compared to admission - improving mood and range of affect, decreased auditory hallucinations, and does not appear internally preoccupied. Denies SI or HI. Tolerating medications well at present. UA is remarkable for WBC- wife has reported recent diagnosis of chlamydia. No associated symptoms reported by patient   Treatment Plan Summary: Daily contact with patient to assess and evaluate symptoms and progress in treatment, Medication management, Plan inpatient treatment  and medications as below  Encourage ongoing group and milieu participation to work on coping skills and symptom reduction Encourage efforts to maintain abstinence from illegal drugs/cocaine. Continue  Risperidone 2 mgrs QHS for psychosis Continue Effexor XR 37.5 mgrs QDAY for depression Continue Trazodone 75 mgrs QHS for insomnia Continue Vistaril 25 mgrs Q 6 hours PRN for anxiety as needed  As requested by patient, will order STD work up. Treatment team working on disposition planning options.  Encarnacion Slates, NP, PMHNP, FNP-BC. 01/16/2016, 12:40 PMPatient ID: Amalia Greenhouse, male   DOB: [birth year], 25 y.o.   MRN: [id number] Agree with NP progress note as above

## 2016-01-17 DIAGNOSIS — F29 Unspecified psychosis not due to a substance or known physiological condition: Secondary | ICD-10-CM

## 2016-01-17 LAB — HIV ANTIBODY (ROUTINE TESTING W REFLEX): HIV SCREEN 4TH GENERATION: NONREACTIVE

## 2016-01-17 LAB — HEPATITIS C ANTIBODY: HCV AB: 0.1 {s_co_ratio} (ref 0.0–0.9)

## 2016-01-17 LAB — RPR: RPR: NONREACTIVE

## 2016-01-17 LAB — HEPATITIS B SURFACE ANTIGEN: Hepatitis B Surface Ag: NEGATIVE

## 2016-01-17 NOTE — Progress Notes (Signed)
D: Pt is flat and withdrawn to self, makes minimal eye contact. Pt at the time of assessment endorses auditory and visual hallucinations; states, "the voices are still there but they better than what they were yesterday." Pt depression, pain, SI, HI. Pt denied Pt was observed interaction with peers A: Medications offered as prescribed.  Support, encouragement, and safe environment provided.  15-minute safety checks continue. R: Pt was med compliant.  Pt attend wrap up group. Safety checks continue.

## 2016-01-17 NOTE — Progress Notes (Signed)
[facility] MD Progress Note  01/17/2016 10:44 AM Jalyn Rosero  MRN:  [id number]  Subjective: Traxton reports " I am feeling okay today."  Objective: [name] is awake, alert and oriented *3, seen resting in bedroom with.  Denies suicidal or homicidal ideation. Reports auditory hallucination states voice are at a minimal today.  Patient reports  interacting well with staff and others. Patient reports  he is medication compliant without mediation side effects.  States his depression 2/10. Patient states "I feel like a king today" states a good appetite and reports resting well. Support, encouragement and reassurance was provided.   Principal Problem: Psychosis, Cocaine / Cannabis Abuse   Diagnosis:   Patient Active Problem List   Diagnosis Date Noted  . Schizoaffective disorder, bipolar type ([facility]) [F25.0] 01/13/2016  . Cocaine abuse [F14.10] 09/22/2014   Total Time spent with patient: 15 minutes  Past Medical History:  Past Medical History:  Diagnosis Date  . Anxiety   . Bipolar affective ([facility])   . Schizophrenic disorder ([facility])    History reviewed. No pertinent surgical history. Family History: History reviewed. No pertinent family history.  Social History:  History  Alcohol Use No    Comment: Patient denies     History  Drug Use  . Types: Marijuana, "Crack" cocaine    Social History   Social History  . Marital status: Married    Spouse name: N/A  . Number of children: N/A  . Years of education: N/A   Social History Main Topics  . Smoking status: Light Tobacco Smoker    Types: Cigarettes  . Smokeless tobacco: Never Used  . Alcohol use No     Comment: Patient denies  . Drug use:     Types: Marijuana, "Crack" cocaine  . Sexual activity: Yes   Other Topics Concern  . None   Social History Narrative  . None   Additional Social History:   Sleep: fair, states he slept only fairly .  Appetite:  Good  Current Medications: Current  Facility-Administered Medications  Medication Dose Route Frequency Provider Last Rate Last Dose  . acetaminophen (TYLENOL) tablet 650 mg  650 mg Oral Q6H PRN Charm Rings, NP      . alum & mag hydroxide-simeth (MAALOX/MYLANTA) 200-200-20 MG/5ML suspension 30 mL  30 mL Oral Q4H PRN Charm Rings, NP   30 mL at 01/16/16 1631  . feeding supplement (ENSURE ENLIVE) (ENSURE ENLIVE) liquid 237 mL  237 mL Oral BID BM Rockey Situ Cobos, MD   237 mL at 01/17/16 0817  . hydrOXYzine (ATARAX/VISTARIL) tablet 25 mg  25 mg Oral Q6H PRN Kerry Hough, PA-C   25 mg at 01/16/16 1946  . ibuprofen (ADVIL,MOTRIN) tablet 600 mg  600 mg Oral TID PRN Beau Fanny, FNP   600 mg at 01/17/16 4098  . magnesium hydroxide (MILK OF MAGNESIA) suspension 30 mL  30 mL Oral Daily PRN Charm Rings, NP   30 mL at 01/14/16 0913  . risperiDONE (RISPERDAL) tablet 2 mg  2 mg Oral Daily Charm Rings, NP   2 mg at 01/17/16 0816  . traZODone (DESYREL) tablet 75 mg  75 mg Oral QHS [name], MD   75 mg at 01/16/16 2118  . venlafaxine XR (EFFEXOR-XR) 24 hr capsule 37.5 mg  37.5 mg Oral Q breakfast Sanjuana Kava, NP   37.5 mg at 01/17/16 1191    Lab Results:  Results for orders placed or performed during the hospital  encounter of 01/13/16 (from the past 48 hour(s))  HIV antibody     Status: None   Collection Time: 01/16/16  6:42 AM  Result Value Ref Range   HIV Screen 4th Generation wRfx Non Reactive Non Reactive    Comment: (NOTE) Performed At: [facility] [address] [place], [place] [id number] Mila HomerHancock William F MD [phone] Performed at [facility]   RPR     Status: None   Collection Time: 01/16/16  6:42 AM  Result Value Ref Range   RPR Ser Ql Non Reactive Non Reactive    Comment: (NOTE) Performed At: [facility] [place] [address] [place], [place] [id number] Mila HomerHancock William F MD VH:[id number]:[phone] Performed at [facility]   Hepatitis B surface antigen      Status: None   Collection Time: 01/16/16  6:42 AM  Result Value Ref Range   Hepatitis B Surface Ag Negative Negative    Comment: (NOTE) Performed At: [facility] [place] [address] [place], [place] [id number] Mila HomerHancock William F MD UV:[id number]:[phone] Performed at [facility]   Hepatitis C antibody     Status: None   Collection Time: 01/16/16  6:42 AM  Result Value Ref Range   HCV Ab 0.1 0.0 - 0.9 s/co ratio    Comment: (NOTE)                                  Negative:     < 0.8                             Indeterminate: 0.8 - 0.9                                  Positive:     > 0.9 The CDC recommends that a positive HCV antibody result be followed up with a HCV Nucleic Acid Amplification test ([id number]). Performed At: [facility] [place] [address] [place], [place] [id number] Mila HomerHancock William F MD IR:[phone] Performed at [facility]     Blood Alcohol level:  Lab Results  Component Value Date   [facility] 01/12/2016   ETH <5 06/30/2015    Metabolic Disorder Labs: Lab Results  Component Value Date   HGBA1C 5.6 01/15/2016   MPG 114 01/15/2016   Lab Results  Component Value Date   PROLACTIN 33.1 (H) 01/15/2016   Lab Results  Component Value Date   CHOL 143 01/15/2016   TRIG 91 01/15/2016   HDL 47 01/15/2016   CHOLHDL 3.0 01/15/2016   VLDL 18 01/15/2016   LDLCALC 78 01/15/2016    Physical Findings: AIMS: Facial and Oral Movements Muscles of Facial Expression: None, normal Lips and Perioral Area: None, normal Jaw: None, normal Tongue: None, normal,Extremity Movements Upper (arms, wrists, hands, fingers): None, normal Lower (legs, knees, ankles, toes): None, normal, Trunk Movements Neck, shoulders, hips: None, normal, Overall Severity Severity of abnormal movements (highest score from questions above): None, normal Incapacitation due to abnormal movements: None, normal Patient's awareness of abnormal movements (rate only  patient's report): No Awareness, Dental Status Current problems with teeth and/or dentures?: No Does patient usually wear dentures?: No  CIWA:    COWS:     Musculoskeletal: Strength & Muscle Tone: within normal limits Gait & Station: normal Patient leans: N/A  Psychiatric Specialty Exam: Physical Exam  Nursing note and vitals reviewed. Constitutional: He is oriented to person, place, and time. He appears well-developed.  Cardiovascular: Normal rate.   Neurological: He is alert and oriented to person, place, and time.  Psychiatric: He has a normal mood and affect. His behavior is normal.    ROS no headache, no chest pain, no shortness of breath, denies dysuria, hematuria, urgency, no fever , no chills   Blood pressure 107/70, pulse 97, temperature 98.2 F (36.8 C), resp. rate 18, height 5\' 8"  (1.727 m), weight 71.7 kg (158 lb), SpO2 100 %.Body mass index is 24.02 kg/m.  General Appearance: Well Groomed  Eye Contact:  Good  Speech:  Normal Rate  Volume:  Normal  Mood:  improving mood   Affect:  appropriate, more reactive   Thought Process:  Better organized, more linear  Orientation:  Full (Time, Place, and Person)  Thought Content: reports improved but not resolved auditory hallucinations, does not appear internally preoccupied   Suicidal Thoughts:  No denies suicidal or self injurious ideations at this time, denies any homicidal or violent ideations  Homicidal Thoughts:  No  Memory:  recent and remote grossly intact   Judgement:  Other:  improving   Insight:  improving   Psychomotor Activity:  Normal  Concentration:  Concentration: Good and Attention Span: Good  Recall:  Good  Fund of Knowledge:  Good  Language:  Good  Akathisia:  Negative  Handed:  Right  AIMS (if indicated):     Assets:  Communication Skills Desire for Improvement Resilience  ADL's:  Intact  Cognition:  WNL  Sleep:  Number of Hours: 6.75     I agree with current treatment plan on 01/17/2016  Patient seen face-to-face for psychiatric evaluation follow-up, chart reviewed and case discussed. Reviewed the information documented and agree with the treatment plan.  Treatment Plan Summary: Daily contact with patient to assess and evaluate symptoms and progress in treatment, Medication management, Plan inpatient treatment  and medications as below  Encourage ongoing group and milieu participation to work on coping skills and symptom reduction Encourage efforts to maintain abstinence from illegal drugs/cocaine. Continue  Risperidone 2 mgrs QHS for psychosis Continue Effexor XR 37.5 mgrs QDAY for depression Continue Trazodone 75 mgrs QHS for insomnia Continue Vistaril 25 mgrs Q 6 hours PRN for anxiety as needed  As requested by patient, will order STD work up. Treatment team working on disposition planning options.  Oneta Rackanika N Lewis, NP 01/17/2016, 10:44 AM   Agree with NP progress note as above

## 2016-01-17 NOTE — Progress Notes (Signed)
The focus of this group is to help patients review their daily goal of treatment and discuss progress on daily workbooks. Pt attended the evening group session and responded to all discussion prompts from the Writer. Pt shared that today was a good day on the unit, the highlight of which was catching up on his sleep. "I slept so good today, like really restful sleep. I just have so much energy." Pt also shared that he felt grateful to be alive today in spite of all he's going through.  Pt rated his day a 10 out of 10 and his affect was appropriate. He also volunteered several encouraging comments toward his peers when they spoke.

## 2016-01-17 NOTE — Progress Notes (Signed)
Nursing Note 01/17/2016 [id number]  Data Reports sleeping good with PRN sleep med.  Rates depression 0/10, hopelessness 0/10, and anxiety 0/10. Affect bright and appropriate.  Denies HI, SI, AVH.  Patient attending groups, polite and appropriate with staff and peers.  Watching football this afternoon, very animated, clapping, and shouting at TV at times.  Spends free time in day area.  Remains safe and appropriate on unit.  Action Spoke with patient 1:1, nurse offered support to patient throughout shift.  Continues to be monitored on 15 minute checks for safety.  Response Remains safe and appropriate on unit.

## 2016-01-17 NOTE — [facility] Group Notes (Signed)
.  Adult Therapy Group Note  Date:  01/17/2016 Time: 11:00-11:30AM  Group Topic/Focus:  In group today we briefly listened to music that patients requested, and they shared what each song meant to them.   Participation Level:  Active  Participation Quality:  Attentive and Sharing  Affect:  Appropriate  Cognitive:  Oriented  Insight: Improving  Engagement in Group:  Developing/Improving  Modes of Intervention:  Activity and Exploration  Additional Comments:  Pt responded better than any other patient to the music, making requests.  Carloyn JaegerMareida J Grossman-Orr 01/17/2016, 1:45 PM

## 2016-01-18 MED ORDER — MIRTAZAPINE 7.5 MG PO TABS
7.5000 mg | ORAL_TABLET | Freq: Every day | ORAL | Status: DC
  Administered 2016-01-18 – 2016-01-19 (×2): 7.5 mg via ORAL
  Filled 2016-01-18 (×4): qty 1

## 2016-01-18 MED ORDER — NICOTINE POLACRILEX 2 MG MT GUM
2.0000 mg | CHEWING_GUM | OROMUCOSAL | Status: DC | PRN
  Administered 2016-01-18: 2 mg via ORAL

## 2016-01-18 MED ORDER — VENLAFAXINE HCL ER 75 MG PO CP24
75.0000 mg | ORAL_CAPSULE | Freq: Every day | ORAL | Status: DC
  Administered 2016-01-19 – 2016-01-21 (×3): 75 mg via ORAL
  Filled 2016-01-18 (×2): qty 1
  Filled 2016-01-18: qty 7
  Filled 2016-01-18: qty 1
  Filled 2016-01-18: qty 7
  Filled 2016-01-18: qty 1

## 2016-01-18 NOTE — Progress Notes (Signed)
D: Pt denies SI/HI/AVH. Pt is pleasant and cooperative. Pt stated he was doing good, pt said he was ready to go home. Pt seen interacting in dayroom with peers at times this evening  A: Pt was offered support and encouragement. Pt was given scheduled medications. Pt was encourage to attend groups. Q 15 minute checks were done for safety.   R:Pt attends groups and interacts well with peers and staff. Pt is taking medication. Pt has no complaints.Pt receptive to treatment and safety maintained on unit.

## 2016-01-18 NOTE — Progress Notes (Signed)
  D: Pt was laughing and using humor when discussing his issues. Informed the writer that he "knows he's supposed to take his medicines". Stated, "I start feeling good, then I stop taking my medicine". Informed the writer that his wife is due in  January and that he's expecting his first daughter. Pt explained how his wife warned him that she could get pregnant after the birth of their son. Stated, "I didn't believe her and now our son is only 6711 months old". Pt also informed the writer that his wife tried to warn him about his behavior. Writer encouraged pt to pay closer attention to his wife and what she knows. Pt has no questions or concerns.    A:  Support and encouragement was offered. 15 min checks continued for safety.  R: Pt remains safe.

## 2016-01-18 NOTE — Plan of Care (Signed)
Problem: Health Behavior/Discharge Planning: Goal: Identification of resources available to assist in meeting health care needs will improve Outcome: Progressing Discussed after discharge plans with pt. Pt receptive to resources available.

## 2016-01-18 NOTE — Progress Notes (Signed)
Adult Psychoeducational Group Note  Date:  01/18/2016 Time:  12:30 AM  Group Topic/Focus:  Wrap-Up Group:   The focus of this group is to help patients review their daily goal of treatment and discuss progress on daily workbooks.   Participation Level:  Active  Participation Quality:  Appropriate and Attentive  Affect:  Appropriate  Cognitive:  Alert and Appropriate  Insight: Appropriate  Engagement in Group:  Engaged  Modes of Intervention:  Discussion, Education and Support  Additional Comments:  Pt rated their day at a 7 out of 10. Pt shared that he was trying to stay positive and was able to talk to some of his children. Malachy MoanJeffers, Jaxsun Ciampi S 01/18/2016, 12:30 AM

## 2016-01-18 NOTE — Progress Notes (Signed)
D: Pt presents in a clam cooperative mood. Took all meds without incident. Denies SI/HI/AVH.  A; Safety checks maintained. Emotional support given.  R: Pt contracted for safety.

## 2016-01-18 NOTE — Progress Notes (Signed)
[facility] MD Progress Note  01/18/2016 3:22 PM Howard Carlson  MRN:  [id number]  Subjective:  Patient reports that he is doing well and feels better than prior to admission. Reports he does not like Trazodone , as it causes him to feel overmedicated. States that in the past he has tried Remeron which was better tolerated .  Objective:  I have discussed case with treatment team and have met with patient . He presents improved - mood is "OK", affect is reactive, and at this time denies auditory hallucinations. On unit has been calm, pleasant, visible in day room, interactive with peers and staff . As above, reports preferring Remeron over current Trazodone trial . He is tolerating Risperidone and  Effexor XR well . No akathisia.  Labs reviewed.   Principal Problem: Psychosis, Cocaine / Cannabis Abuse   Diagnosis:   Patient Active Problem List   Diagnosis Date Noted  . Schizoaffective disorder, bipolar type ([place]) [F25.0] 01/13/2016  . Cocaine abuse [F14.10] 09/22/2014   Total Time spent with patient: 20 minutes   Past Medical History:  Past Medical History:  Diagnosis Date  . Anxiety   . Bipolar affective ([place])   . Schizophrenic disorder ([place])    History reviewed. No pertinent surgical history. Family History: History reviewed. No pertinent family history.  Social History:  History  Alcohol Use No    Comment: Patient denies     History  Drug Use  . Types: Marijuana, "Crack" cocaine    Social History   Social History  . Marital status: Married    Spouse name: N/A  . Number of children: N/A  . Years of education: N/A   Social History Main Topics  . Smoking status: Light Tobacco Smoker    Types: Cigarettes  . Smokeless tobacco: Never Used  . Alcohol use No     Comment: Patient denies  . Drug use:     Types: Marijuana, "Crack" cocaine  . Sexual activity: Yes   Other Topics Concern  . None   Social History Narrative  . None   Additional Social History:    Sleep: improved   Appetite:  Good  Current Medications: Current Facility-Administered Medications  Medication Dose Route Frequency Provider Last Rate Last Dose  . acetaminophen (TYLENOL) tablet 650 mg  650 mg Oral Q6H PRN Patrecia Pour, NP      . alum & mag hydroxide-simeth (MAALOX/MYLANTA) 200-200-20 MG/5ML suspension 30 mL  30 mL Oral Q4H PRN Patrecia Pour, NP   30 mL at 01/16/16 1631  . feeding supplement (ENSURE ENLIVE) (ENSURE ENLIVE) liquid 237 mL  237 mL Oral BID BM Myer Peer Cobos, MD   237 mL at 01/18/16 1437  . hydrOXYzine (ATARAX/VISTARIL) tablet 25 mg  25 mg Oral Q6H PRN Laverle Hobby, PA-C   25 mg at 01/16/16 1946  . ibuprofen (ADVIL,MOTRIN) tablet 600 mg  600 mg Oral TID PRN Benjamine Mola, FNP   600 mg at 01/17/16 2045  . magnesium hydroxide (MILK OF MAGNESIA) suspension 30 mL  30 mL Oral Daily PRN Patrecia Pour, NP   30 mL at 01/14/16 0913  . mirtazapine (REMERON) tablet 7.5 mg  7.5 mg Oral QHS Myer Peer Cobos, MD      . risperiDONE (RISPERDAL) tablet 2 mg  2 mg Oral Daily Patrecia Pour, NP   2 mg at 01/18/16 3664  . [START ON 01/19/2016] venlafaxine XR (EFFEXOR-XR) 24 hr capsule 75 mg  75 mg Oral Q breakfast  Jenne Campus, MD        Lab Results:  No results found for this or any previous visit (from the past 48 hour(s)).  Blood Alcohol level:  Lab Results  Component Value Date   ETH <5 01/12/2016   ETH <5 76/19/5093    Metabolic Disorder Labs: Lab Results  Component Value Date   HGBA1C 5.6 01/15/2016   MPG 114 01/15/2016   Lab Results  Component Value Date   PROLACTIN 33.1 (H) 01/15/2016   Lab Results  Component Value Date   CHOL 143 01/15/2016   TRIG 91 01/15/2016   HDL 47 01/15/2016   CHOLHDL 3.0 01/15/2016   VLDL 18 01/15/2016   LDLCALC 78 01/15/2016    Physical Findings: AIMS: Facial and Oral Movements Muscles of Facial Expression: None, normal Lips and Perioral Area: None, normal Jaw: None, normal Tongue: None, normal,Extremity  Movements Upper (arms, wrists, hands, fingers): None, normal Lower (legs, knees, ankles, toes): None, normal, Trunk Movements Neck, shoulders, hips: None, normal, Overall Severity Severity of abnormal movements (highest score from questions above): None, normal Incapacitation due to abnormal movements: None, normal Patient's awareness of abnormal movements (rate only patient's report): No Awareness, Dental Status Current problems with teeth and/or dentures?: No Does patient usually wear dentures?: No  CIWA:    COWS:     Musculoskeletal: Strength & Muscle Tone: within normal limits Gait & Station: normal Patient leans: N/A  Psychiatric Specialty Exam: Physical Exam  Nursing note and vitals reviewed. Constitutional: He is oriented to person, place, and time. He appears well-developed.  Cardiovascular: Normal rate.   Neurological: He is alert and oriented to person, place, and time.  Psychiatric: He has a normal mood and affect. His behavior is normal.    ROS no headache, no chest pain, no shortness of breath, denies dysuria, hematuria, urgency, no fever , no chills   Blood pressure 118/71, pulse 90, temperature 98.8 F (37.1 C), resp. rate 18, height 5' 8"  (1.727 m), weight 71.7 kg (158 lb), SpO2 100 %.Body mass index is 24.02 kg/m.  General Appearance: improved grooming   Eye Contact:  Good  Speech:  Normal Rate  Volume:  Normal  Mood:  Improved, today presents euthymic   Affect:  Reactive, brighter  Thought Process: linear  Orientation:  Full (Time, Place, and Person)  Thought Content: denies current hallucinations, no delusions expressed, does not appear internally preoccupied   Suicidal Thoughts:  No denies suicidal or self injurious ideations at this time, denies any homicidal or violent ideations  Homicidal Thoughts:  No  Memory:  recent and remote grossly intact   Judgement:  Other:  improving   Insight:  improving   Psychomotor Activity:  Normal  Concentration:   Concentration: Good and Attention Span: Good  Recall:  Good  Fund of Knowledge:  Good  Language:  Good  Akathisia:  Negative  Handed:  Right  AIMS (if indicated):     Assets:  Communication Skills Desire for Improvement Resilience  ADL's:  Intact  Cognition:  WNL  Sleep:  Number of Hours: 6.75   Assessment - patient is improved compared to admission - presents with improved mood, improved range of affect, and at this time is not endorsing or presenting with psychotic symptoms. Denies SI. Does report trazodone is associated with feeling sedated in AM and subjectively overmedicated, and is requesting switch to Remeron, which he states he took in the past and was well tolerated   Treatment Plan Summary: Daily contact with  patient to assess and evaluate symptoms and progress in treatment, Medication management, Plan inpatient treatment  and medications as below  Encourage ongoing group and milieu participation to work on coping skills and symptom reduction Encourage efforts to maintain abstinence from illegal drugs/cocaine. Continue  Risperidone 2 mgrs QHS for psychosis Increase Effexor XR to 75 mgrs QDAY for depression D/C Trazodone Start Remeron 7.5 mgrs QHS for insomnia and depression Continue Vistaril 25 mgrs Q 6 hours PRN for anxiety as needed  Treatment team working on disposition planning options.  Neita Garnet, MD 01/18/2016, 3:22 PM   Patient ID: Howard Carlson, male   DOB: 1990/04/20, 25 y.o.   MRN: [id number]

## 2016-01-19 LAB — GC/CHLAMYDIA PROBE AMP (~~LOC~~) NOT AT ARMC
CHLAMYDIA, DNA PROBE: POSITIVE — AB
NEISSERIA GONORRHEA: NEGATIVE

## 2016-01-19 NOTE — Progress Notes (Signed)
Recreation Therapy Notes  Date: 01/19/16 Time: 1000 Location: 500 Hall Dayroom  Group Topic: Leisure Education  Goal Area(s) Addresses:  Patient will identify positive leisure activities.  Patient will identify one positive benefit of participation in leisure activities.   Behavioral Response: Engaged  Intervention: [employer]ix[employer] bag, various sports equipment, paper to write on  Activity: Gamin.  LRT put various sports equipment and random objects into a bag.  Each person in the group was to pick something from the bag.  The group would then have 20 minutes to make up a game, give it a name, create the rules and use all the equipment they selected.  At the end, they would then have to teach the other group how to play their game.  Education:  Leisure Education, Building control surveyorDischarge Planning  Education Outcome: Acknowledges education/In group clarification offered/Needs additional education  Clinical Observations/Feedback: Pt was bright and engaged.  Pt seemed to be more of the leader of the group but listened when his peers had suggestions.  Pt and his group came up with an activity called "Bounce" with 12 small plastic cups, tape, miniature soccer ball and 2 containers of Play dough.  Pt expressed in doing this activity he learned something new "two heads are better than one".  Pt expressed the activity was easy to complete, once he saw the things they had to work with "the ideas just started coming".   Caroll RancherMarjette Brylin Stopper, LRT/CTRS         Caroll RancherLindsay, Selmer Adduci A 01/19/2016 11:52 AM

## 2016-01-19 NOTE — Progress Notes (Signed)
D: Pt denies SI/HI/AVH. Pt is pleasant and cooperative. Pt stated he was still having issues with his wife and where they will live. Pt wants to go to [place] where his father is , but she wants to be near [place] near her family.   A: Pt was offered support and encouragement. Pt was given scheduled medications. Pt was encourage to attend groups. Q 15 minute checks were done for safety.   R:Pt attends groups and interacts well with peers and staff. Pt is taking medication. Pt has no complaints .Pt receptive to treatment and safety maintained on unit.

## 2016-01-19 NOTE — [facility] Group Notes (Signed)
[facility] LCSW Group Therapy  01/19/2016 3:04 PM  Type of Therapy:  Group Therapy  Participation Level:  Did Not Attend  Modes of Intervention:  Discussion, Education, Socialization and Support  Summary of Progress/Problems: Patients identify obstacles, self-sabotaging and enabling behaviors. Patients explore aspects of self sabotage and enabling and how to limit these self-destructive behaviors in everyday life.  Randal Goens L Zeferino Mounts MSW, LCSWA  01/19/2016, 3:04 PM   

## 2016-01-19 NOTE — Progress Notes (Signed)
DAR NOTE: Patient presents with flat affect and depressed mood.  Denies pain, auditory and visual hallucinations.  Rates depression at 3, hopelessness at 4, and anxiety at 4.  Described energy level as normal and concentration as good.  Maintained on routine safety checks.  Medications given as prescribed.  Support and encouragement offered as needed.  Attended group and participated.  States goal for today is "staying positive."  Patient was visible in milieu for therapy.  Offered no complaint.

## 2016-01-19 NOTE — Tx Team (Signed)
Interdisciplinary Treatment and Diagnostic Plan Update  01/19/2016 Time of Session: 10:47 AM  Howard Carlson MRN: [id number]  Principal Diagnosis: <principal problem not specified>  Secondary Diagnoses: Active Problems:   Schizoaffective disorder, bipolar type ([facility])   Current Medications:  Current Facility-Administered Medications  Medication Dose Route Frequency Provider Last Rate Last Dose  . acetaminophen (TYLENOL) tablet 650 mg  650 mg Oral Q6H PRN Patrecia Pour, NP      . alum & mag hydroxide-simeth (MAALOX/MYLANTA) 200-200-20 MG/5ML suspension 30 mL  30 mL Oral Q4H PRN Patrecia Pour, NP   30 mL at 01/18/16 2105  . feeding supplement (ENSURE ENLIVE) (ENSURE ENLIVE) liquid 237 mL  237 mL Oral BID BM Myer Peer Cobos, MD   237 mL at 01/19/16 0820  . hydrOXYzine (ATARAX/VISTARIL) tablet 25 mg  25 mg Oral Q6H PRN Laverle Hobby, PA-C   25 mg at 01/18/16 2106  . ibuprofen (ADVIL,MOTRIN) tablet 600 mg  600 mg Oral TID PRN Benjamine Mola, FNP   600 mg at 01/19/16 0962  . magnesium hydroxide (MILK OF MAGNESIA) suspension 30 mL  30 mL Oral Daily PRN Patrecia Pour, NP   30 mL at 01/14/16 0913  . mirtazapine (REMERON) tablet 7.5 mg  7.5 mg Oral QHS Myer Peer Cobos, MD   7.5 mg at 01/18/16 2106  . nicotine polacrilex (NICORETTE) gum 2 mg  2 mg Oral PRN Jenne Campus, MD   2 mg at 01/18/16 1720  . risperiDONE (RISPERDAL) tablet 2 mg  2 mg Oral Daily Patrecia Pour, NP   2 mg at 01/19/16 0819  . venlafaxine XR (EFFEXOR-XR) 24 hr capsule 75 mg  75 mg Oral Q breakfast Jenne Campus, MD   75 mg at 01/19/16 8366    PTA Medications: No prescriptions prior to admission.    Treatment Modalities: Medication Management, Group therapy, Case management,  1 to 1 session with clinician, Psychoeducation, Recreational therapy.   Physician Treatment Plan for Primary Diagnosis: <principal problem not specified> Long Term Goal(s): Improvement in symptoms so as ready for discharge  Short  Term Goals: Ability to identify changes in lifestyle to reduce recurrence of condition will improve Ability to demonstrate self-control will improve Ability to identify and develop effective coping behaviors will improve Ability to identify changes in lifestyle to reduce recurrence of condition will improve Compliance with prescribed medications will improve  Medication Management: Evaluate patient's response, side effects, and tolerance of medication regimen.  Therapeutic Interventions: 1 to 1 sessions, Unit Group sessions and Medication administration.  Evaluation of Outcomes: Adequate for Discharge  Physician Treatment Plan for Secondary Diagnosis: Active Problems:   Schizoaffective disorder, bipolar type ([place])   Long Term Goal(s): Improvement in symptoms so as ready for discharge  Short Term Goals: Ability to identify changes in lifestyle to reduce recurrence of condition will improve Ability to demonstrate self-control will improve Ability to identify and develop effective coping behaviors will improve Ability to identify changes in lifestyle to reduce recurrence of condition will improve Compliance with prescribed medications will improve  Medication Management: Evaluate patient's response, side effects, and tolerance of medication regimen.  Therapeutic Interventions: 1 to 1 sessions, Unit Group sessions and Medication administration.  Evaluation of Outcomes: Adequate for Discharge   RN Treatment Plan for Primary Diagnosis: <principal problem not specified> Long Term Goal(s): Knowledge of disease and therapeutic regimen to maintain health will improve  Short Term Goals: Ability to identify and develop effective coping behaviors will improve and  Compliance with prescribed medications will improve  Medication Management: RN will administer medications as ordered by provider, will assess and evaluate patient's response and provide education to patient for prescribed medication.  RN will report any adverse and/or side effects to prescribing provider.  Therapeutic Interventions: 1 on 1 counseling sessions, Psychoeducation, Medication administration, Evaluate responses to treatment, Monitor vital signs and CBGs as ordered, Perform/monitor CIWA, COWS, AIMS and Fall Risk screenings as ordered, Perform wound care treatments as ordered.  Evaluation of Outcomes: Adequate for Discharge   Recreational Therapy Treatment Plan for Primary Diagnosis: <principal problem not specified> Long Term Goal(s): LTG- Patient will participate in recreation therapy tx in at least 2 group sessions without prompting from LRT.  Short Term Goals:  Patient will be able to identify at least 5 coping skills for admitting dx by conclusion of recreation therapy tx.  Treatment Modalities: Group and Pet Therapy  Therapeutic Interventions: Psychoeducation  Evaluation of Outcomes: Adequate for Discharge   LCSW Treatment Plan for Primary Diagnosis: <principal problem not specified> Long Term Goal(s): Safe transition to appropriate next level of care at discharge, Engage patient in therapeutic group addressing interpersonal concerns.  Short Term Goals: Engage patient in aftercare planning with referrals and resources  Therapeutic Interventions: Assess for all discharge needs, 1 to 1 time with Social worker, Explore available resources and support systems, Assess for adequacy in community support network, Educate family and significant other(s) on suicide prevention, Complete Psychosocial Assessment, Interpersonal group therapy.   Evaluation of Outcomes: Met  Pt will d/c to shelter, get further help from [facility], follow up outpatient clinic   Progress in Treatment: Attending groups: Yes Participating in groups: Yes Taking medication as prescribed: Yes Toleration medication: Yes, no side effects reported at this time Family/Significant other contact made: No for help with AH and "getting  focused." Patient understands diagnosis: Yes AEB Discussing patient identified problems/goals with staff: Yes Medical problems stabilized or resolved: Yes Denies suicidal/homicidal ideation: Yes Issues/concerns per patient self-inventory: None Other: N/A  New problem(s) identified: None identified at this time.   New Short Term/Long Term Goal(s): None identified at this time.   Discharge Plan or Barriers:   Reason for Continuation of Hospitalization:  Depression Hallucinations  Medication stabilization   Estimated Length of Stay:Possible d/c today.   Attendees: Patient: 01/19/2016  10:47 AM  Physician: Neita Garnet MD 01/19/2016  10:47 AM  Nursing: Etheleen Mayhew, RN 01/19/2016  10:47 AM  RN Care Manager: Lars Pinks, RN 01/19/2016  10:47 AM  Social Worker: [place], [place] 01/19/2016  10:47 AM  Recreational Therapist: Ha Placeres  01/19/2016  10:47 AM  Other: Norberto Sorenson 01/19/2016  10:47 AM  Other:  01/19/2016  10:47 AM    Scribe for Treatment Team:  Wray Kearns MSW, LCSWA  01/19/2016

## 2016-01-19 NOTE — Progress Notes (Signed)
Adult Psychoeducational Group Note  Date:  01/19/2016 Time:  8:53 PM  Group Topic/Focus:  Wrap-Up Group:   The focus of this group is to help patients review their daily goal of treatment and discuss progress on daily workbooks.   Participation Level:  Active  Participation Quality:  Appropriate  Affect:  Appropriate  Cognitive:  Appropriate  Insight: Appropriate  Engagement in Group:  Engaged  Modes of Intervention:  Discussion  Additional Comments:  The patient expressed that he had a good day.The patient also said that he rates today a 10. Howard Carlson, Howard Carlson 01/19/2016, 8:53 PM

## 2016-01-19 NOTE — Progress Notes (Signed)
[facility] MD Progress Note  01/19/2016 2:07 PM Howard Carlson  MRN:  [id number]  Subjective: Howard Carlson reports that he is doing well and feels better than prior to admission. He presents with improved affects. Denies any medication side effects. Is likely to be discharged in am. Will obtain Chlamydia panel today per urine. Wife recently diagnosed with Chlamydia infection. . Objective:  I have discussed case with treatment team and have met with patient . He presents improved - mood is "OK", affect is reactive, and at this time denies auditory hallucinations. On unit has been calm, pleasant, visible in day room, interactive with peers and staff. Participates in group counseling sessions. As above, reports preferring Remeron over current Trazodone trial . He is tolerating Risperidone and  Effexor XR well . No akathisia.  Labs reviewed.   Principal Problem: Psychosis, Cocaine / Cannabis Abuse   Diagnosis:   Patient Active Problem List   Diagnosis Date Noted  . Schizoaffective disorder, bipolar type ([place]) [F25.0] 01/13/2016  . Cocaine abuse [F14.10] 09/22/2014   Total Time spent with patient: 15 minutes   Past Medical History:  Past Medical History:  Diagnosis Date  . Anxiety   . Bipolar affective ([place])   . Schizophrenic disorder ([place])    History reviewed. No pertinent surgical history. Family History: History reviewed. No pertinent family history.  Social History:  History  Alcohol Use No    Comment: Patient denies     History  Drug Use  . Types: Marijuana, "Crack" cocaine    Social History   Social History  . Marital status: Married    Spouse name: N/A  . Number of children: N/A  . Years of education: N/A   Social History Main Topics  . Smoking status: Light Tobacco Smoker    Types: Cigarettes  . Smokeless tobacco: Never Used  . Alcohol use No     Comment: Patient denies  . Drug use:     Types: Marijuana, "Crack" cocaine  . Sexual activity: Yes   Other Topics  Concern  . None   Social History Narrative  . None   Additional Social History:   Sleep: improved   Appetite:  Good  Current Medications: Current Facility-Administered Medications  Medication Dose Route Frequency Provider Last Rate Last Dose  . acetaminophen (TYLENOL) tablet 650 mg  650 mg Oral Q6H PRN Patrecia Pour, NP      . alum & mag hydroxide-simeth (MAALOX/MYLANTA) 200-200-20 MG/5ML suspension 30 mL  30 mL Oral Q4H PRN Patrecia Pour, NP   30 mL at 01/18/16 2105  . feeding supplement (ENSURE ENLIVE) (ENSURE ENLIVE) liquid 237 mL  237 mL Oral BID BM Myer Peer Cobos, MD   237 mL at 01/19/16 0820  . hydrOXYzine (ATARAX/VISTARIL) tablet 25 mg  25 mg Oral Q6H PRN Laverle Hobby, PA-C   25 mg at 01/18/16 2106  . ibuprofen (ADVIL,MOTRIN) tablet 600 mg  600 mg Oral TID PRN Benjamine Mola, FNP   600 mg at 01/19/16 6295  . magnesium hydroxide (MILK OF MAGNESIA) suspension 30 mL  30 mL Oral Daily PRN Patrecia Pour, NP   30 mL at 01/14/16 0913  . mirtazapine (REMERON) tablet 7.5 mg  7.5 mg Oral QHS Myer Peer Cobos, MD   7.5 mg at 01/18/16 2106  . nicotine polacrilex (NICORETTE) gum 2 mg  2 mg Oral PRN Jenne Campus, MD   2 mg at 01/18/16 1720  . risperiDONE (RISPERDAL) tablet 2 mg  2 mg  Oral Daily Patrecia Pour, NP   2 mg at 01/19/16 6734  . venlafaxine XR (EFFEXOR-XR) 24 hr capsule 75 mg  75 mg Oral Q breakfast Jenne Campus, MD   75 mg at 01/19/16 1937    Lab Results:  No results found for this or any previous visit (from the past 48 hour(s)).  Blood Alcohol level:  Lab Results  Component Value Date   ETH <5 01/12/2016   ETH <5 90/24/0973    Metabolic Disorder Labs: Lab Results  Component Value Date   HGBA1C 5.6 01/15/2016   MPG 114 01/15/2016   Lab Results  Component Value Date   PROLACTIN 33.1 (H) 01/15/2016   Lab Results  Component Value Date   CHOL 143 01/15/2016   TRIG 91 01/15/2016   HDL 47 01/15/2016   CHOLHDL 3.0 01/15/2016   VLDL 18 01/15/2016    LDLCALC 78 01/15/2016    Physical Findings: AIMS: Facial and Oral Movements Muscles of Facial Expression: None, normal Lips and Perioral Area: None, normal Jaw: None, normal Tongue: None, normal,Extremity Movements Upper (arms, wrists, hands, fingers): None, normal Lower (legs, knees, ankles, toes): None, normal, Trunk Movements Neck, shoulders, hips: None, normal, Overall Severity Severity of abnormal movements (highest score from questions above): None, normal Incapacitation due to abnormal movements: None, normal Patient's awareness of abnormal movements (rate only patient's report): No Awareness, Dental Status Current problems with teeth and/or dentures?: No Does patient usually wear dentures?: No  CIWA:    COWS:     Musculoskeletal: Strength & Muscle Tone: within normal limits Gait & Station: normal Patient leans: N/A  Psychiatric Specialty Exam: Physical Exam  Nursing note and vitals reviewed. Constitutional: He is oriented to person, place, and time. He appears well-developed.  Cardiovascular: Normal rate.   Neurological: He is alert and oriented to person, place, and time.  Psychiatric: He has a normal mood and affect. His behavior is normal.    ROS no headache, no chest pain, no shortness of breath, denies dysuria, hematuria, urgency, no fever , no chills   Blood pressure 100/68, pulse 92, temperature 98.7 F (37.1 C), temperature source Oral, resp. rate 18, height 5' 8"  (1.727 m), weight 71.7 kg (158 lb), SpO2 100 %.Body mass index is 24.02 kg/m.  General Appearance: improved grooming   Eye Contact:  Good  Speech:  Normal Rate  Volume:  Normal  Mood:  Improved, today presents euthymic   Affect:  Reactive, brighter  Thought Process: linear  Orientation:  Full (Time, Place, and Person)  Thought Content: denies current hallucinations, no delusions expressed, does not appear internally preoccupied   Suicidal Thoughts:  No denies suicidal or self injurious ideations  at this time, denies any homicidal or violent ideations  Homicidal Thoughts:  No  Memory:  recent and remote grossly intact   Judgement:  Other:  improving   Insight:  improving   Psychomotor Activity:  Normal  Concentration:  Concentration: Good and Attention Span: Good  Recall:  Good  Fund of Knowledge:  Good  Language:  Good  Akathisia:  Negative  Handed:  Right  AIMS (if indicated):     Assets:  Communication Skills Desire for Improvement Resilience  ADL's:  Intact  Cognition:  WNL  Sleep:  Number of Hours: 6.75   Assessment - patient is improved compared to admission - presents with improved mood, improved range of affect, and at this time is not endorsing or presenting with psychotic symptoms. Denies SI. Does report trazodone is  associated with feeling sedated in AM and subjectively overmedicated, and is requesting switch to Remeron, which he states he took in the past and was well tolerated   Treatment Plan Summary: Daily contact with patient to assess and evaluate symptoms and progress in treatment, Medication management, Plan inpatient treatment  and medications as below  Encourage ongoing group and milieu participation to work on coping skills and symptom reduction Encourage efforts to maintain abstinence from illegal drugs/cocaine. Continue Risperidone 2 mgrs QHS for psychosis Continue Effexor XR to 75 mgrs QDAY for depression Conmtinue Remeron 7.5 mgrs QHS for insomnia and depression Continue Vistaril 25 mgrs Q 6 hours PRN for anxiety as needed  Treatment team working on disposition planning options. Obtain Chlamydia panel today (urine).  Encarnacion Slates, NP, PMHNP, FNP-BC 01/19/2016, 2:07 PM  Patient ID: Amalia Greenhouse, male   DOB: 1990/02/29, 25 y.o.   MRN: [id number] Agree with NP progress note as above

## 2016-01-20 DIAGNOSIS — F1210 Cannabis abuse, uncomplicated: Secondary | ICD-10-CM

## 2016-01-20 DIAGNOSIS — F17210 Nicotine dependence, cigarettes, uncomplicated: Secondary | ICD-10-CM

## 2016-01-20 DIAGNOSIS — A749 Chlamydial infection, unspecified: Secondary | ICD-10-CM | POA: Clinically undetermined

## 2016-01-20 DIAGNOSIS — F1410 Cocaine abuse, uncomplicated: Secondary | ICD-10-CM | POA: Clinically undetermined

## 2016-01-20 LAB — GC/CHLAMYDIA PROBE AMP (~~LOC~~) NOT AT ARMC: Trichomonas: NEGATIVE

## 2016-01-20 MED ORDER — OLANZAPINE 5 MG PO TABS: 5.0000 mg | ORAL_TABLET | Freq: Three times a day (TID) | ORAL | Status: DC | PRN

## 2016-01-20 MED ORDER — QUETIAPINE FUMARATE 100 MG PO TABS
100.0000 mg | ORAL_TABLET | Freq: Every day | ORAL | Status: DC
Start: 2016-01-20 — End: 2016-01-21
  Administered 2016-01-20: 100 mg via ORAL
  Filled 2016-01-20: qty 7
  Filled 2016-01-20 (×2): qty 1
  Filled 2016-01-20: qty 7

## 2016-01-20 MED ORDER — MIRTAZAPINE 15 MG PO TABS
15.0000 mg | ORAL_TABLET | Freq: Every day | ORAL | Status: DC
  Administered 2016-01-20: 15 mg via ORAL
  Filled 2016-01-20: qty 7
  Filled 2016-01-20 (×2): qty 1
  Filled 2016-01-20: qty 7

## 2016-01-20 MED ORDER — AZITHROMYCIN 500 MG PO TABS
1000.0000 mg | ORAL_TABLET | Freq: Once | ORAL | Status: AC
  Administered 2016-01-20: 1000 mg via ORAL
  Filled 2016-01-20: qty 4
  Filled 2016-01-20: qty 2

## 2016-01-20 MED ORDER — OLANZAPINE 10 MG IM SOLR: 5.0000 mg | Freq: Three times a day (TID) | INTRAMUSCULAR | Status: DC | PRN

## 2016-01-20 NOTE — Progress Notes (Signed)
Adult Psychoeducational Group Note  Date:  01/20/2016 Time:  9:27 PM  Group Topic/Focus:  Wrap-Up Group:   The focus of this group is to help patients review their daily goal of treatment and discuss progress on daily workbooks.   Participation Level:  Active  Participation Quality:  Appropriate  Affect:  Appropriate  Cognitive:  Alert  Insight: Appropriate  Engagement in Group:  Engaged  Modes of Intervention:  Discussion  Additional Comments:  Patient states, "my day was okay". Patient's goal throughout the day was to stay positive. Elloise Roark L Adelyn Roscher 01/20/2016, 9:27 PM

## 2016-01-20 NOTE — Progress Notes (Signed)
DAR NOTE: Patient presents with anxious affect and depressed mood.  Denies pain, auditory and visual hallucinations.  Described energy level as normal and concentration as good.  Rates depression at 0, hopelessness at 2, and anxiety at 3.  Maintained on routine safety checks.  Medications given as prescribed.  Support and encouragement offered as needed.  States goal for today is "staying positive."  Patient was visible in the dayroom watching TV.  Minimal interaction with staff and peers.

## 2016-01-20 NOTE — [facility] Group Notes (Signed)
[facility] LCSW Group Therapy  01/20/2016 4:33 PM  Type of Therapy:  Group Therapy  Participation Level:  Active  Participation Quality: Attentive   Affect:  Flat  Cognitive:  Alert  Insight:  Limited  Engagement in Therapy:  Limited  Modes of Intervention:  Discussion, Education, Socialization and Support  Summary of Progress/Problems: Balance in life: Patients will discuss the concept of balance and how it looks and feels to be unbalanced. Pt will identify areas in their life that is unbalanced and ways to become more balanced.    Daisy Floroandace L Tadarrius Burch MSW, LCSWA  01/20/2016, 4:33 PM

## 2016-01-20 NOTE — Progress Notes (Addendum)
[facility]  01/20/2016 12:47 PM Howard Carlson  MRN:  [id number]  Subjective: Patient states " I still hear voices asking me to kill myself , I did not sleep at all last night."  . Objective:  Patient seen and chart reviewed.Discussed patient with treatment team.  I have reviewed notes in EHR per Dr.Cobos . Pt today seen as anxious , depressed , states he feels frustrated due to his voices in his head that are command - asking him to kill self.However he denies SI. Pt also had a restless night last night. Pt does not feel trazodone is effective. He was given remeron last night which also did not help much. Discussed changing his risperidone to seroquel - he agrees . Pt also came back positive for chlamydia infection - discussed treatment and also provided education about informing his sexual contacts and treating them. Pt voiced understanding .    Principal Problem: Schizoaffective disorder , bipolar type  Diagnosis:   Patient Active Problem List   Diagnosis Date Noted  . Cocaine use disorder, mild, abuse [F14.10] 01/20/2016  . Cannabis use disorder, mild, abuse [F12.10] 01/20/2016  . Chlamydia infection [A74.9] 01/20/2016  . Schizoaffective disorder, bipolar type ([facility]) [F25.0] 01/13/2016  . Cocaine abuse [F14.10] 09/22/2014   Total Time spent with patient: 25 minutes   Past Medical History: reports hx of seizures - unsure what kind  Past Medical History:  Diagnosis Date  . Anxiety   . Bipolar affective ([facility])   . Schizophrenic disorder ([facility])    History reviewed. No pertinent surgical history. Family History: Please see H&P.   Social History: Please see H&P.  History  Alcohol Use No    Comment: Patient denies     History  Drug Use  . Types: Marijuana, "Crack" cocaine    Social History   Social History  . Marital status: Married    Spouse name: N/A  . Number of children: N/A  . Years of education: N/A   Social History Main Topics  . Smoking  status: Light Tobacco Smoker    Types: Cigarettes  . Smokeless tobacco: Never Used  . Alcohol use No     Comment: Patient denies  . Drug use:     Types: Marijuana, "Crack" cocaine  . Sexual activity: Yes   Other Topics Concern  . None   Social History Narrative  . None   Additional Social History:   Sleep : poor   Appetite:  Fair  Current Medications: Current Facility-Administered Medications  Medication Dose Route Frequency Provider Last Rate Last Dose  . acetaminophen (TYLENOL) tablet 650 mg  650 mg Oral Q6H PRN [name], NP      . alum & mag hydroxide-simeth (MAALOX/MYLANTA) 200-200-20 MG/5ML suspension 30 mL  30 mL Oral Q4H PRN [name], NP   30 mL at 01/18/16 2105  . azithromycin (ZITHROMAX) tablet 1,000 mg  1,000 mg Oral Once [name], MD      . feeding supplement (ENSURE ENLIVE) (ENSURE ENLIVE) liquid 237 mL  237 mL Oral BID BM [name], MD   237 mL at 01/20/16 0817  . hydrOXYzine (ATARAX/VISTARIL) tablet 25 mg  25 mg Oral Q6H PRN [name], PA-C   25 mg at 01/19/16 2130  . ibuprofen (ADVIL,MOTRIN) tablet 600 mg  600 mg Oral TID PRN [name], FNP   600 mg at 01/19/16 2130  . magnesium hydroxide (MILK OF MAGNESIA) suspension 30 mL  30 mL Oral  Daily PRN Charm Rings, NP   30 mL at 01/14/16 0913  . mirtazapine (REMERON) tablet 15 mg  15 mg Oral QHS [name], MD      . nicotine polacrilex (NICORETTE) gum 2 mg  2 mg Oral PRN [name], MD   2 mg at 01/18/16 1720  . OLANZapine (ZYPREXA) tablet 5 mg  5 mg Oral TID PRN Jomarie Longs, MD       Or  . OLANZapine (ZYPREXA) injection 5 mg  5 mg Intramuscular TID PRN Jomarie Longs, MD      . QUEtiapine (SEROQUEL) tablet 100 mg  100 mg Oral QHS [name], MD      . venlafaxine XR (EFFEXOR-XR) 24 hr capsule 75 mg  75 mg Oral Q breakfast [name], MD   75 mg at 01/20/16 1610    Lab Results:  No results found for this or any previous visit (from the past 48  hour(s)).  Blood Alcohol level:  Lab Results  Component Value Date   ETH <5 01/12/2016   ETH <5 06/30/2015    Metabolic Disorder Labs: Lab Results  Component Value Date   HGBA1C 5.6 01/15/2016   MPG 114 01/15/2016   Lab Results  Component Value Date   PROLACTIN 33.1 (H) 01/15/2016   Lab Results  Component Value Date   CHOL 143 01/15/2016   TRIG 91 01/15/2016   HDL 47 01/15/2016   CHOLHDL 3.0 01/15/2016   VLDL 18 01/15/2016   LDLCALC 78 01/15/2016    Physical Findings: AIMS: Facial and Oral Movements Muscles of Facial Expression: None, normal Lips and Perioral Area: None, normal Jaw: None, normal Tongue: None, normal,Extremity Movements Upper (arms, wrists, hands, fingers): None, normal Lower (legs, knees, ankles, toes): None, normal, Trunk Movements Neck, shoulders, hips: None, normal, Overall Severity Severity of abnormal movements (highest score from questions above): None, normal Incapacitation due to abnormal movements: None, normal Patient's awareness of abnormal movements (rate only patient's report): No Awareness, Dental Status Current problems with teeth and/or dentures?: No Does patient usually wear dentures?: No  CIWA:    COWS:     Musculoskeletal: Strength & Muscle Tone: within normal limits Gait & Station: normal Patient leans: N/A  Psychiatric Specialty Exam: Physical Exam  Nursing note and vitals reviewed.   Review of Systems  Psychiatric/Behavioral: Positive for depression, hallucinations and substance abuse. The patient is nervous/anxious and has insomnia.   All other systems reviewed and are negative.       Blood pressure 112/77, pulse 87, temperature 98.3 F (36.8 C), temperature source Oral, resp. rate 18, height 5\' 8"  (1.727 m), weight 71.7 kg (158 lb), SpO2 100 %.Body mass index is 24.02 kg/m.  General Appearance: fair  Eye Contact:  Fair  Speech:  Normal Rate  Volume:  Normal  Mood: depressed, anxious  Affect: congruent   Thought Process: linear  Orientation:  Full (Time, Place, and Person)  Thought Content: AH - command - kill self  Suicidal Thoughts:  No but has command AH to kill self  Homicidal Thoughts:  No  Memory:  Immediate;   Fair Recent;   Fair Remote;   Fair  Judgement:  Impaired  Insight:  Present  Psychomotor Activity:  Normal  Concentration:  Concentration: Good and Attention Span: Good  Recall:  Good  Fund of Knowledge:  Good  Language:  Good  Akathisia:  Negative  Handed:  Right  AIMS (if indicated):     Assets:  Communication Skills Desire  for Improvement  ADL's:  Intact  Cognition:  WNL  Sleep:  Number of Hours: 6   Assessment - Patient last night had restless sleep - also endorses command AH asking him to kill self - pt agrees to medictaion changes as below. Also chlamydia resulted positive this AM - will provide treatment.  Schizoaffective disorder, bipolar type ([facility]) unstable     Will continue today 01/20/16  plan as below except where it is noted.   Treatment Plan Summary: Daily contact with patient to assess and evaluate symptoms and progress in treatment and Medication management Encourage ongoing group and milieu participation to work on coping skills and symptom reduction Encourage efforts to maintain abstinence from illegal drugs/cocaine. Will change Risperidone to Seroquel for psychosis, sleep.Start Seroquel 100 mg po qhs. Continue Effexor XR to 75 mgrs PO QDAY for depression Will increase  Remeron to 15 mgrs PO QHS for insomnia and depression Continue Vistaril 25 mgrs Q 6 hours PRN po  for anxiety as needed  Treatment team working on disposition planning options. Chlamydia - positive - zithromax 1 gm - x 1 dose. Provided education . Will get TSH tomorrow AM. CSW will continue to work on disposition.  Alfred Harrel, MD 01/20/2016, 12:47 PM  Patient ID: Howard Carlson, male   DOB: 13-Sep-1990, 25 y.o.   MRN: [id number]

## 2016-01-20 NOTE — Progress Notes (Signed)
Recreation Therapy Notes  Date: 01/20/16 Time: 1000 Location: 500 Hall Dayroom  Group Topic: Self-Esteem  Goal Area(s) Addresses:  Patient will identify positive ways to increase self-esteem. Patient will verbalize benefit of increased self-esteem.  Intervention: Worksheet with mirror outline, writing utencils  Activity: Ship brokerMirror, Ship brokerMirror.  Patients were given a worksheet with an outline of a mirror on it.  Patients were to write or draw how they see themselves at this moment.  Once patients identified how they viewed themselves, they were to identify any areas where they felt they could use some improvement and what steps they could take to improve in those areas.  Education: Self-Esteem, Building control surveyorDischarge Planning.   Education Outcome: Acknowledges education/In group clarification offered/Needs additional education  Clinical Observations/Feedback: Pt did not attend group.   Caroll RancherMarjette Karmello Abercrombie, LRT/CTRS         Caroll RancherLindsay, Enzio Buchler A 01/20/2016 12:09 PM

## 2016-01-21 LAB — TSH: TSH: 1.362 u[IU]/mL (ref 0.350–4.500)

## 2016-01-21 MED ORDER — NICOTINE POLACRILEX 2 MG MT GUM: 2.0000 mg | CHEWING_GUM | OROMUCOSAL | 0 refills | Status: DC | PRN

## 2016-01-21 MED ORDER — VENLAFAXINE HCL ER 75 MG PO CP24: 75.0000 mg | ORAL_CAPSULE | Freq: Every day | ORAL | 0 refills | Status: DC

## 2016-01-21 MED ORDER — MIRTAZAPINE 15 MG PO TABS: 15.0000 mg | ORAL_TABLET | Freq: Every day | ORAL | 0 refills | Status: DC

## 2016-01-21 MED ORDER — HYDROXYZINE HCL 25 MG PO TABS: 25.0000 mg | ORAL_TABLET | Freq: Four times a day (QID) | ORAL | 0 refills | Status: DC | PRN

## 2016-01-21 MED ORDER — QUETIAPINE FUMARATE 100 MG PO TABS: 100.0000 mg | ORAL_TABLET | Freq: Every day | ORAL | 0 refills | Status: DC

## 2016-01-21 NOTE — Progress Notes (Signed)
  [facility] :  Will you be returning to the same living situation after discharge:  Yes,  home  At discharge, do you have transportation home?: Yes,  bus  Do you have the ability to pay for your medications: Yes,  mental health   Release of information consent forms completed and in the chart;  Patient's signature needed at discharge.  Patient to Follow up at: Follow-up Information    [facility] Follow up in 3 day(s).   Specialty:  Behavioral Health Why:  Walk in times are Monday through Friday between 8:00am and 10:00am. Please arrive early for prompt service.  Contact information: [address] [place] [address] [phone]           Next level of care provider [facility] Link:no  Safety Planning and Suicide Prevention discussed: Yes,  with patient   Have you used any form of tobacco in the last 30 days? (Cigarettes, Smokeless Tobacco, Cigars, and/or Pipes): Yes  Has patient been referred to the Quitline?: Patient refused referral  Patient has been referred for addiction treatment: Yes  Rondall Allegraandace L Javanna Patin MSW, LCSWA  01/21/2016, 12:56 PM

## 2016-01-21 NOTE — [facility] Suicide Risk Assessment (Signed)
[facility]   Principal Problem: Schizoaffective disorder, bipolar type [facility](HCC) Discharge Diagnoses:  Patient Active Problem List   Diagnosis Date Noted  . Cocaine use disorder, mild, abuse [F14.10] 01/20/2016  . Cannabis use disorder, mild, abuse [F12.10] 01/20/2016  . Chlamydia infection [A74.9] 01/20/2016  . Schizoaffective disorder, bipolar type ([facility]) [F25.0] 01/13/2016  . Cocaine abuse [F14.10] 09/22/2014    Total Time spent with patient: 30 minutes  Musculoskeletal: Strength & Muscle Tone: within normal limits Gait & Station: normal Patient leans: N/A  Psychiatric Specialty Exam: Review of Systems  Psychiatric/Behavioral: Negative for depression and suicidal ideas. The patient does not have insomnia.   All other systems reviewed and are negative.   Blood pressure 112/77, pulse 87, temperature 98.3 F (36.8 C), temperature source Oral, resp. rate 18, height 5\' 8"  (1.727 m), weight 71.7 kg (158 lb), SpO2 100 %.Body mass index is 24.02 kg/m.  General Appearance: Casual  Eye Contact::  Fair  Speech:  Clear and Coherent409  Volume:  Normal  Mood:  Euthymic  Affect:  Appropriate  Thought Process:  Goal Directed and Descriptions of Associations: Intact  Orientation:  Full (Time, Place, and Person)  Thought Content:  Logical  Suicidal Thoughts:  No  Homicidal Thoughts:  No  Memory:  Immediate;   Fair Recent;   Fair Remote;   Fair  Judgement:  Fair  Insight:  Fair  Psychomotor Activity:  Normal  Concentration:  Fair  Recall:  FiservFair  Fund of Knowledge:Fair  Language: Fair  Akathisia:  No  Handed:  Right  AIMS (if indicated):     Assets:  Communication Skills Desire for Improvement Social Support  Sleep:  Number of Hours: 6.5  Cognition: WNL  ADL's:  Intact   Mental Status Per Nursing Assessment::   On Admission:  Self-harm thoughts  Demographic Factors:  Male  Loss Factors: NA  Historical Factors: Impulsivity  Risk Reduction  Factors:   Positive therapeutic relationship  Continued Clinical Symptoms:  Alcohol/Substance Abuse/Dependencies Previous Psychiatric Diagnoses and Treatments  Cognitive Features That Contribute To Risk:  None    Suicide Risk:  Minimal: No identifiable suicidal ideation.  Patients presenting with no risk factors but with morbid ruminations; may be classified as minimal risk based on the severity of the depressive symptoms  Follow-up Information    [facility] Follow up in 3 day(s).   Specialty:  Behavioral Health Why:  Walk in times are Monday through Friday between 8:00am and 10:00am. Please arrive early for prompt service.  Contact information: [address] [place] [address] [phone]           Plan Of Care/Follow-up recommendations:  Activity:  no restrictions Diet:  regular Tests:  follow up with after care as scheduled , [facility] for [facility]lam[facility] infection - s/p treatment. Other:  none  Isis Costanza, MD 01/21/2016, 9:18 AM

## 2016-01-21 NOTE — Plan of Care (Signed)
Problem: Medication: Goal: Compliance with prescribed medication regimen will improve Outcome: Progressing Pt has been compliant with scheduled medication tonight.    

## 2016-01-21 NOTE — Discharge Summary (Signed)
Physician Discharge Summary Note  Patient:  Howard Carlson is an 25 y.o., male MRN:  [id number] DOB:  [birth year] Patient phone:  [phone] (home)  Patient address:   [address] [place] [id number],  Total Time spent with patient: 45 minutes  Date of Admission:  01/13/2016 Date of Discharge: 01/21/16  Reason for Admission:   This is the first admission assessment for this 25 year old African-American male in this hospital. [facility] with complaints of auditory hallucinations. He was in need of mood stabilization treatments. During this assessment, Howard Carlson reports, "The [facility]. My neighbored called them. I was a little out of it on that day because I was seeing & hearing things. I had wandered into my neighbors house. He asked me if I was okay, that was when I blacked out. He called 911 for me. I started hearing & seeing stuff since age 25. That was when I started the attempts to kill myself by hanging, cutting & overdose. I have been on medication for mental health issues. I was being seen at the [facility] in [place], [place]. I stopped taking my mental health medications about 1 year ago. I was doing well on those medications. I will need to get back on those medicines".  Principal Problem: Schizoaffective disorder, bipolar type [facility](HCC) Discharge Diagnoses: Patient Active Problem List   Diagnosis Date Noted  . Cocaine use disorder, mild, abuse [F14.10] 01/20/2016  . Cannabis use disorder, mild, abuse [F12.10] 01/20/2016  . Chlamydia infection [A74.9] 01/20/2016  . Schizoaffective disorder, bipolar type (HCC) [F25.0] 01/13/2016  . Cocaine abuse [F14.10] 09/22/2014    Past Psychiatric History: see H&P  Past Medical History:  Past Medical History:  Diagnosis Date  . Anxiety   . Bipolar affective (HCC)   . Schizophrenic disorder (HCC)    History reviewed. No pertinent surgical  history. Family History: History reviewed. No pertinent family history. Family Psychiatric  History: see H&P Social History:  History  Alcohol Use No    Comment: Patient denies     History  Drug Use  . Types: Marijuana, "Crack" cocaine    Social History   Social History  . Marital status: Married    Spouse name: N/A  . Number of children: N/A  . Years of education: N/A   Social History Main Topics  . Smoking status: Light Tobacco Smoker    Types: Cigarettes  . Smokeless tobacco: Never Used  . Alcohol use No     Comment: Patient denies  . Drug use:     Types: Marijuana, "Crack" cocaine  . Sexual activity: Yes   Other Topics Concern  . None   Social History Narrative  . None    Hospital Course:   Howard Carlson was admitted for Schizoaffective disorder, bipolar type [facility]HCC) , with psychosis and crisis management.  Pt was treated discharged with the medications listed below under Medication List.  Medical problems were identified and treated as needed.  Home medications were restarted as appropriate.  Improvement was monitored by observation and Howard Carlson 's daily report of symptom reduction.  Emotional and mental status was monitored by daily self-inventory reports completed by Howard Carlson and clinical staff.         Howard Carlson was evaluated by the treatment team for stability and plans for continued recovery upon discharge. Howard Carlson 's motivation was an integral factor for scheduling further  treatment. Employment, transportation, bed availability, health status, family support, and any pending legal issues were also considered during hospital stay. Pt was offered further treatment options upon discharge including but not limited to Residential, Intensive Outpatient, and Outpatient treatment.  Howard Carlson will follow up with the services as listed below under Follow Up Information.     Upon completion of this admission the patient  was both mentally and medically stable for discharge denying suicidal/homicidal ideation, auditory/visual/tactile hallucinations, delusional thoughts and paranoia.    Family session went well. No seclusion or restraint.  Howard Carlson responded well to treatment with remeron, nicotine, vistaril, effexor, seroquel without adverse effects. Pt demonstrated improvement without reported or observed adverse effects to the point of stability appropriate for outpatient management. Pertinent labs include: UDS+ cocaine, THC, Prolactin 33.1 for which outpatient follow-up is necessary for lab recheck as mentioned below. Reviewed CBC, CMP, BAL, and UDS; all unremarkable aside from noted exceptions.   Physical Findings: AIMS: Facial and Oral Movements Muscles of Facial Expression: None, normal Lips and Perioral Area: None, normal Jaw: None, normal Tongue: None, normal,Extremity Movements Upper (arms, wrists, hands, fingers): None, normal Lower (legs, knees, ankles, toes): None, normal, Trunk Movements Neck, shoulders, hips: None, normal, Overall Severity Severity of abnormal movements (highest score from questions above): None, normal Incapacitation due to abnormal movements: None, normal Patient's awareness of abnormal movements (rate only patient's report): No Awareness, Dental Status Current problems with teeth and/or dentures?: No Does patient usually wear dentures?: No  CIWA:    COWS:     Musculoskeletal: Strength & Muscle Tone: within normal limits Gait & Station: normal Patient leans: N/A  Psychiatric Specialty Exam: Physical Exam  Review of Systems  Psychiatric/Behavioral: Positive for depression and substance abuse. Negative for hallucinations and suicidal ideas. The patient is nervous/anxious and has insomnia.   All other systems reviewed and are negative.   Blood pressure 112/77, pulse 87, temperature 98.3 F (36.8 C), temperature source Oral, resp. rate 18, height 5\' 8"  (1.727  m), weight 71.7 kg (158 lb), SpO2 100 %.Body mass index is 24.02 kg/m.  SEE MD PSE WITHIN SRA  Have you used any form of tobacco in the last 30 days? (Cigarettes, Smokeless Tobacco, Cigars, and/or Pipes): Yes  Has this patient used any form of tobacco in the last 30 days? (Cigarettes, Smokeless Tobacco, Cigars, and/or Pipes) Yes, Yes, A prescription for an FDA-approved tobacco cessation medication was offered at discharge and the patient refused  Blood Alcohol level:  Lab Results  Component Value Date   [facility] <5 01/12/2016   ETH <5 06/30/2015    Metabolic Disorder Labs:  Lab Results  Component Value Date   HGBA1C 5.6 01/15/2016   MPG 114 01/15/2016   Lab Results  Component Value Date   PROLACTIN 33.1 (H) 01/15/2016   Lab Results  Component Value Date   CHOL 143 01/15/2016   TRIG 91 01/15/2016   HDL 47 01/15/2016   CHOLHDL 3.0 01/15/2016   VLDL 18 01/15/2016   LDLCALC 78 01/15/2016    See Psychiatric Specialty Exam and Suicide Risk Assessment completed by Attending Physician prior to discharge.  Discharge destination:  Home  Is patient on multiple antipsychotic therapies at discharge:  No   Has Patient had three or more failed trials of antipsychotic monotherapy by history:  No  Recommended Plan for Multiple Antipsychotic Therapies: NA   Allergies as of 01/21/2016      Reactions   Amoxicillin Other (See Comments)   Has patient had  a PCN reaction causing immediate rash, facial/tongue/throat swelling, SOB or lightheadedness with hypotension: Yes Has patient had a PCN reaction causing severe rash involving mucus membranes or skin necrosis: Yes Has patient had a PCN reaction that required hospitalization: Yes Has patient had a PCN reaction occurring within the last 10 years: No f all of the above answers are "NO", then may proceed with Cephalosporin use.      Medication List    TAKE these medications     Indication  hydrOXYzine 25 MG tablet Commonly known as:   ATARAX/VISTARIL Take 1 tablet (25 mg total) by mouth every 6 (six) hours as needed for anxiety.  Indication:  Anxiety Neurosis   mirtazapine 15 MG tablet Commonly known as:  REMERON Take 1 tablet (15 mg total) by mouth at bedtime.  Indication:  Trouble Sleeping, Major Depressive Disorder   nicotine polacrilex 2 MG gum Commonly known as:  NICORETTE Take 1 each (2 mg total) by mouth as needed for smoking cessation.  Indication:  Nicotine Addiction   QUEtiapine 100 MG tablet Commonly known as:  SEROQUEL Take 1 tablet (100 mg total) by mouth at bedtime.  Indication:  mood stabilization   venlafaxine XR 75 MG 24 hr capsule Commonly known as:  EFFEXOR-XR Take 1 capsule (75 mg total) by mouth daily with breakfast. Start taking on:  01/22/2016  Indication:  Major Depressive Disorder      Follow-up Information    [facility] Follow up in 3 day(s).   Specialty:  Behavioral Health Why:  Walk in times are Monday through Friday between 8:00am and 10:00am. Please arrive early for prompt service.  [address] [place] [address] [phone]           Follow-up recommendations:  Activity:  As tolerated Diet:  Heart healthy with low sodium.  Comments:   Take all medications as prescribed. Keep all follow-up appointments as scheduled.  Do not consume alcohol or use illegal drugs while on prescription medications. Report any adverse effects from your medications to your primary care provider promptly.  In the event of recurrent symptoms or worsening symptoms, call 911, a crisis hotline, or go to the nearest emergency department for evaluation.    Signed: Beau Fanny, FNP 01/21/2016, 10:54 AM

## 2016-01-21 NOTE — Plan of Care (Signed)
Problem: Howard Carlson Participation in Recreation Therapeutic Interventions Goal: STG-Patient will identify at least five coping skills for ** STG: Coping Skills - Patient will be able to identify at least 5 coping skills for S/I and H/I by conclusion of recreation therapy tx  Outcome: Completed/Met Date Met: 01/21/16 Pt was able to identify coping skills at the completion of anger management and self-expression recreation therapy sessions.  Victorino Sparrow, LRT/CTRS

## 2016-01-21 NOTE — Progress Notes (Signed)
Patient discharged to lobby. Patient was stable and appreciative at that time. All papers, samples and prescriptions were given and valuables returned. Verbal understanding expressed. Denies SI/HI and A/VH. Patient given opportunity to express concerns and ask questions.  

## 2016-01-21 NOTE — Progress Notes (Signed)
D: Pt was in the hallway upon initial approach.  Pt presents with anxious affect and mood.  He reports he is supposed to discharge tomorrow and he feels safe to do so.  Pt then reports he is unsure of where he will go after discharge.  He states "my brother said I could come stay with him for a couple days but I need to call him and make sure I still can; I'm trying to see if my sister can pick me up, if not I'll probably [facility] or [place] train [place]."  Pt denies SI/HI, reports visual hallucinations of "seeing people, shadows move, things I know is not there."  Pt reports chronic back pain of 10/10.  Pt has been visible in milieu interacting with peers and staff appropriately.  Pt attended evening group.   A: Introduced self to pt.  Actively listened to pt and offered support and encouragement. Medications administered per order.  PRN medication administered for pain and anxiety. R: Pt is safe on the unit.  Pt is compliant with medications.  Pt verbally contracts for safety.  Will continue to monitor and assess.

## 2016-01-21 NOTE — Progress Notes (Signed)
Recreation Therapy Notes  Date: 01/21/16 Time: 1000 Location: 500 Hall Dayroom  Group Topic: Communication  Goal Area(s) Addresses:  Patient will effectively communicate with peers in group.  Patient will verbalize benefit of healthy communication. Patient will verbalize positive effect of healthy communication on post d/c goals.  Patient will identify communication techniques that made activity effective for group.   Behavioral Response: Engaged  Intervention: Nurse, adultGiant beach ball, chairs  Activity: Keep It ContractorGoing Volleyball.  Patients were arranged in a circle.  Patients were to hit a giant beach ball to each like you would in volleyball.  Patients were to hit the ball as many times as they could without it rolling to a stop on the floor.  The ball could bounce off of the floor but not stop.  The group was trying to beat the record set by a previous group of 650.  Patients were to use teamwork, communication and strategies to complete the activity.  Education: Communication, Discharge Planning  Education Outcome: Acknowledges understanding/In group clarification offered/Needs additional education.   Clinical Observations/Feedback:  Pt was social with peers and very active during group.  Pt was smiling throughout group.  Pt stated these skills "won't make you feel alone" because you have someone to help you work through your issues.  Caroll RancherMarjette Ravon Mortellaro, LRT/CTRS         Caroll RancherLindsay, Zaylah Blecha A 01/21/2016 12:13 PM

## 2016-02-06 ENCOUNTER — Emergency Department (HOSPITAL_COMMUNITY)
Admission: EM | Admit: 2016-02-06 | Discharge: 2016-02-06 | Disposition: A | Discharge status: Home / Self Care | Payer: Self-pay | Attending: Dermatology | Admitting: Dermatology

## 2016-02-06 ENCOUNTER — Encounter (HOSPITAL_COMMUNITY): Payer: Self-pay | Admitting: Emergency Medicine

## 2016-02-06 DIAGNOSIS — Z202 Contact with and (suspected) exposure to infections with a predominantly sexual mode of transmission: Secondary | ICD-10-CM | POA: Insufficient documentation

## 2016-02-06 DIAGNOSIS — Z5321 Procedure and treatment not carried out due to patient leaving prior to being seen by health care provider: Secondary | ICD-10-CM | POA: Insufficient documentation

## 2016-02-06 NOTE — ED Triage Notes (Signed)
Patient was told after [facility] to come back to ED to be rechecked for STDs.  Patient has had unprotected sex in the last week.  No drainage.  No pain.

## 2016-02-07 ENCOUNTER — Emergency Department (HOSPITAL_COMMUNITY)
Admission: EM | Admit: 2016-02-07 | Discharge: 2016-02-08 | Disposition: A | Discharge status: Home / Self Care | Payer: Medicaid Other | Attending: Emergency Medicine | Admitting: Emergency Medicine

## 2016-02-07 ENCOUNTER — Encounter (HOSPITAL_COMMUNITY): Payer: Self-pay

## 2016-02-07 ENCOUNTER — Encounter (HOSPITAL_COMMUNITY): Payer: Self-pay | Admitting: Emergency Medicine

## 2016-02-07 ENCOUNTER — Emergency Department (HOSPITAL_COMMUNITY)
Admission: EM | Admit: 2016-02-07 | Discharge: 2016-02-07 | Discharge status: Against Medical Advice | Payer: Medicaid Other | Source: Home / Self Care | Attending: Emergency Medicine | Admitting: Emergency Medicine

## 2016-02-07 DIAGNOSIS — F17210 Nicotine dependence, cigarettes, uncomplicated: Secondary | ICD-10-CM | POA: Insufficient documentation

## 2016-02-07 DIAGNOSIS — Z79899 Other long term (current) drug therapy: Secondary | ICD-10-CM

## 2016-02-07 DIAGNOSIS — T43504A Poisoning by unspecified antipsychotics and neuroleptics, undetermined, initial encounter: Secondary | ICD-10-CM | POA: Insufficient documentation

## 2016-02-07 DIAGNOSIS — F419 Anxiety disorder, unspecified: Secondary | ICD-10-CM | POA: Diagnosis not present

## 2016-02-07 DIAGNOSIS — Z711 Person with feared health complaint in whom no diagnosis is made: Secondary | ICD-10-CM

## 2016-02-07 DIAGNOSIS — Z202 Contact with and (suspected) exposure to infections with a predominantly sexual mode of transmission: Secondary | ICD-10-CM

## 2016-02-07 DIAGNOSIS — IMO0001 Reserved for inherently not codable concepts without codable children: Secondary | ICD-10-CM

## 2016-02-07 LAB — CBC WITH DIFFERENTIAL/PLATELET
BASOS ABS: 0 10*3/uL (ref 0.0–0.1)
Basophils Absolute: 0 10*3/uL (ref 0.0–0.1)
Basophils Relative: 0 %
Basophils Relative: 0 %
EOS ABS: 0.2 10*3/uL (ref 0.0–0.7)
EOS PCT: 3 %
EOS PCT: 1 %
Eosinophils Absolute: 0.1 10*3/uL (ref 0.0–0.7)
HCT: 42.2 % (ref 39.0–52.0)
HEMATOCRIT: 37 % — AB (ref 39.0–52.0)
HEMOGLOBIN: 12.6 g/dL — AB (ref 13.0–17.0)
Hemoglobin: 14 g/dL (ref 13.0–17.0)
LYMPHS ABS: 1.2 10*3/uL (ref 0.7–4.0)
LYMPHS PCT: 13 %
Lymphocytes Relative: 36 %
Lymphs Abs: 2.5 10*3/uL (ref 0.7–4.0)
MCH: 28.6 pg (ref 26.0–34.0)
MCH: 28.6 pg (ref 26.0–34.0)
MCHC: 33.2 g/dL (ref 30.0–36.0)
MCHC: 34.1 g/dL (ref 30.0–36.0)
MCV: 86.1 fL (ref 78.0–100.0)
MCV: 84.1 fL (ref 78.0–100.0)
Monocytes Absolute: 0.4 10*3/uL (ref 0.1–1.0)
Monocytes Absolute: 0.6 10*3/uL (ref 0.1–1.0)
Monocytes Relative: 5 %
Monocytes Relative: 8 %
NEUTROS ABS: 7.3 10*3/uL (ref 1.7–7.7)
NEUTROS PCT: 81 %
Neutro Abs: 3.6 10*3/uL (ref 1.7–7.7)
Neutrophils Relative %: 53 %
PLATELETS: 216 10*3/uL (ref 150–400)
Platelets: 183 10*3/uL (ref 150–400)
RBC: 4.9 MIL/uL (ref 4.22–5.81)
RBC: 4.4 MIL/uL (ref 4.22–5.81)
RDW: 13.8 % (ref 11.5–15.5)
RDW: 14.2 % (ref 11.5–15.5)
WBC: 6.9 10*3/uL (ref 4.0–10.5)
WBC: 8.9 10*3/uL (ref 4.0–10.5)

## 2016-02-07 LAB — RAPID URINE DRUG SCREEN, HOSP PERFORMED
AMPHETAMINES: NOT DETECTED
Barbiturates: NOT DETECTED
Benzodiazepines: NOT DETECTED
Cocaine: POSITIVE — AB
OPIATES: NOT DETECTED
Tetrahydrocannabinol: POSITIVE — AB

## 2016-02-07 LAB — BASIC METABOLIC PANEL
Anion gap: 8 (ref 5–15)
BUN: 15 mg/dL (ref 6–20)
CO2: 26 mmol/L (ref 22–32)
Calcium: 9.4 mg/dL (ref 8.9–10.3)
Chloride: 105 mmol/L (ref 101–111)
Creatinine, Ser: 1.16 mg/dL (ref 0.61–1.24)
GFR calc Af Amer: >60 mL/min (ref >60)
Glucose, Bld: 73 mg/dL (ref 65–99)
POTASSIUM: 3.8 mmol/L (ref 3.5–5.1)
SODIUM: 139 mmol/L (ref 135–145)

## 2016-02-07 LAB — CBG MONITORING, ED: Glucose-Capillary: 108 mg/dL — ABNORMAL HIGH (ref 65–99)

## 2016-02-07 MED ORDER — SODIUM CHLORIDE 0.9 % IV BOLUS (SEPSIS)
1000.0000 mL | Freq: Once | INTRAVENOUS | Status: AC
  Administered 2016-02-07: 1000 mL via INTRAVENOUS

## 2016-02-07 MED ORDER — LORAZEPAM 2 MG/ML IJ SOLN
1.0000 mg | Freq: Once | INTRAMUSCULAR | Status: AC
  Administered 2016-02-07: 1 mg via INTRAVENOUS
  Filled 2016-02-07: qty 1

## 2016-02-07 NOTE — ED Notes (Signed)
Patient removed cardiac monitor. Explained to patient importance of keeping cardiac monitor on.

## 2016-02-07 NOTE — ED Triage Notes (Signed)
Pt called to be seen for onset of spasm. Pt stated that he did not his Respiridal today.  PA notified that pt was making seizure-like movements, pt continued to speak. Pt eased himself to a kneeling position on floor He holding his glasses. Pt eased onto his back. Pt was no longer speaking, but appropriately responsive to questions.  PA at bedside. Pt transported to stretcher for further care

## 2016-02-07 NOTE — ED Notes (Signed)
Pt given  Ice water and a [place]wich

## 2016-02-07 NOTE — ED Provider Notes (Signed)
[facility]-EMERGENCY DEPT Provider Note   CSN: [id number] Arrival date & time: 02/07/16  1230     History   Chief Complaint Chief Complaint  Patient presents with  . SEXUALLY TRANSMITTED DISEASE    HPI Howard Carlson is a 26 y.o. male with past medical history of cocaine use, cannabis use, schizoaffective disorder, anxiety presents to the emergency room initially for a sexually transmitted disease check. Patient initially in fast track. Reportedly patient was in fast track room when he eats himself down onto the floor and started having muscle spasms, seizure-like activity. Patient had not been evaluated by fast track PA at that time, nurse notified the fast track PA moved patient to room in acute side.  Patient reports anxiety, muscle spasms, requesting muscle relaxers.  Patient states that he feels his entire body is tightening which is causing him to have difficulty taking in deep breaths. Patient states that he has had a similar episode of muscle spasms and "seizures" in the past due to his anxiety. Patient reports last month he was hanging out with a friend and started feeling anxious, patient started having muscle spasms and asked his friend to give him his Risperdal. Patient lowered himself to the ground and had muscle spasms for about 1-2 hours eventually he stopped having spasms. Patient states that he has been feeling anxious today, he felt like his anxiety was increasing when he went to fast track ED. Patient states he has been out of his medications for 3-4 days. Patient states he smokes marijuana daily. Patient drinks 2-3 beers a week. Patient states he used to snort cocaine 3 times a week however he stopped this 6 months ago. Patient states that yesterday he was smoking marijuana blunt and his friends told him that it was laced with cocaine. Patient states that yesterday was the last time and only time he smoked cocaine since 6 months ago when he stopped.   HPI  Past Medical  History:  Diagnosis Date  . Anxiety   . Bipolar affective (HCC)   . Schizophrenic disorder [facility])     Patient Active Problem List   Diagnosis Date Noted  . Cocaine use disorder, mild, abuse 01/20/2016  . Cannabis use disorder, mild, abuse 01/20/2016  . Chlamydia infection 01/20/2016  . Schizoaffective disorder, bipolar type (HCC) 01/13/2016  . Cocaine abuse 09/22/2014    History reviewed. No pertinent surgical history.     Home Medications    Prior to Admission medications   Medication Sig Start Date End Date Taking? Authorizing Provider  hydrOXYzine (ATARAX/VISTARIL) 25 MG tablet Take 1 tablet (25 mg total) by mouth every 6 (six) hours as needed for anxiety. 01/21/16  Yes Beau Fanny, FNP  mirtazapine (REMERON) 15 MG tablet Take 1 tablet (15 mg total) by mouth at bedtime. 01/21/16  Yes Beau Fanny, FNP  QUEtiapine (SEROQUEL) 100 MG tablet Take 1 tablet (100 mg total) by mouth at bedtime. 01/21/16  Yes Beau Fanny, FNP  venlafaxine XR (EFFEXOR-XR) 75 MG 24 hr capsule Take 1 capsule (75 mg total) by mouth daily with breakfast. 01/22/16  Yes Beau Fanny, FNP  nicotine polacrilex (NICORETTE) 2 MG gum Take 1 each (2 mg total) by mouth as needed for smoking cessation. 01/21/16   Beau Fanny, FNP    Family History History reviewed. No pertinent family history.  Social History Social History  Substance Use Topics  . Smoking status: Light Tobacco Smoker    Types: Cigarettes  . Smokeless tobacco: Never  Used  . Alcohol use No     Comment: Patient denies     Allergies   Amoxicillin   Review of Systems Review of Systems  Constitutional: Negative for chills and fever.  HENT: Negative for congestion and sore throat.   Eyes: Negative for visual disturbance.  Respiratory: Positive for shortness of breath. Negative for cough.   Cardiovascular: Negative for chest pain and palpitations.  Gastrointestinal: Negative for abdominal pain, blood in stool, constipation,  diarrhea, nausea and vomiting.  Genitourinary: Negative for difficulty urinating, flank pain and hematuria.  Musculoskeletal: Positive for myalgias. Negative for joint swelling.  Skin: Negative for rash.  Neurological: Negative for dizziness, syncope, weakness, light-headedness and headaches.  Hematological: Negative.   Psychiatric/Behavioral: Negative for dysphoric mood, hallucinations, self-injury, sleep disturbance and suicidal ideas. The patient is nervous/anxious.      Physical Exam Updated Vital Signs BP 124/74 (BP Location: Right Arm)   Pulse 75   Temp 98.5 F (36.9 C) (Oral)   Resp 18   SpO2 100%   Physical Exam  Constitutional: He is oriented to person, place, and time. He appears well-developed and well-nourished. No distress.  Pt diaphoretic. Pt currently having muscle spasms with obvious increased muscular tone in neck and upper extremities. Pt is speaking in full sentences.  HENT:  Head: Normocephalic and atraumatic.  Nose: Nose normal.  Mouth/Throat: Oropharynx is clear and moist. No oropharyngeal exudate.  Patient able to open mouth, no trismus. Oral airway is patent. Uvula midline. No pooling of oral secretions. No signs of tongue biting or injury.   Eyes: Conjunctivae and EOM are normal. Pupils are equal, round, and reactive to light.  Neck: Normal range of motion. Neck supple. No JVD present. No tracheal deviation present.  Cardiovascular: Normal rate, regular rhythm, normal heart sounds and intact distal pulses.   No murmur heard. Hypertensive SBP 150s  Pulmonary/Chest: Effort normal and breath sounds normal. No respiratory distress. He has no wheezes. He has no rales.  Abdominal: Soft. Bowel sounds are normal. He exhibits no distension. There is no tenderness.  Musculoskeletal: Normal range of motion. He exhibits no deformity.  Lymphadenopathy:    He has no cervical adenopathy.  Neurological: He is alert and oriented to person, place, and time.  Alert.  Oriented to self, place and time. Increased muscular tone in upper extremities and anterior neck.  Pt having coarse tremors in upper extremities and neck only.   Skin: Skin is warm and dry. Capillary refill takes less than 2 seconds.  Psychiatric: He has a normal mood and affect. His behavior is normal. Judgment and thought content normal.  Nursing note and vitals reviewed.    ED Treatments / Results  Labs (all labs ordered are listed, but only abnormal results are displayed) Labs Reviewed  RAPID URINE DRUG SCREEN, HOSP PERFORMED - Abnormal; Notable for the following:       Result Value   Cocaine POSITIVE (*)    Tetrahydrocannabinol POSITIVE (*)    All other components within normal limits  CBG MONITORING, ED - Abnormal; Notable for the following:    Glucose-Capillary 108 (*)    All other components within normal limits  CBC WITH DIFFERENTIAL/PLATELET  BASIC METABOLIC PANEL  GC/CHLAMYDIA PROBE AMP ([facility]) NOT AT [facility]    EKG  EKG Interpretation None       Radiology No results found.  Procedures Procedures (including critical care time)  Medications Ordered in ED Medications  LORazepam (ATIVAN) injection 1 mg (1 mg Intravenous Given  02/07/16 1606)  sodium chloride 0.9 % bolus 1,000 mL (0 mLs Intravenous Stopped 02/07/16 1841)     Initial Impression / Assessment and Plan / ED Course  I have reviewed the triage vital signs and the nursing notes.  Pertinent labs & imaging results that were available during my care of the patient were reviewed by me and considered in my medical decision making (see chart for details).  Clinical Course as of Feb 07 1931  Wynelle Link Feb 07, 2016  1627 Re-evaluation - pt no longer having muscle spasms. SBP and HR wnl.   [CG]  1636 No hypoglycemia Glucose-Capillary: (!) 108 [CG]  1636 +cocaine.  COCAINE: (!) POSITIVE [CG]  1636 + thc Tetrahydrocannabinol: (!) POSITIVE [CG]  1636 Normotensive  BP: 127/67 [CG]  1636 HR wnl Pulse Rate: 87 [CG]   1637 Normal pulse ox  SpO2: 99 % [CG]  1927 Went to re-evaluate, pt had left AMA  [CG]    Clinical Course User Index [CG] Liberty Handy, PA-C    Initially concerned for withdrawal symptoms, seizure, hypoglycemia. ED workup remarkable for positive cocaine and positive THC in urine. No hypoglycemia. Patient given 1L NS bolus and ativan x 1 in ED during muscular spasms in upper extremities and neck.  Spasms subsided <10 min of ativan administration.  Patient denies heavy alcohol use. Patient denies prior withdrawal symptoms including sweating, nausea, vomiting, seizures. Patient states that he had similar muscle spasms and December 2017 that he attributes to his anxiety. Patient states that before that episode he felt anxious and could feel his body tensing up, he asked his friend who was with him to give him his Risperdal and his spasming eventually subsided in 1-2 hours. Patient reports recent increased anxiety. Patient ran out of his psych medications 3-4 days ago. Vital signs have been within normal limits. Patient tolerated water and [place] sandwich in ED. Patient seen ambulating to restroom without difficulty. I went back to patient's room for reevaluation, patient had left AMA.   Finanormal saline bolus and Ativan l Clinical Impressions(s) / ED Diagnoses   Final diagnoses:  None    New Prescriptions Discharge Medication List as of 02/07/2016  7:31 PM       Liberty Handy, PA-C 02/07/16 1933    Raeford Razor, MD 02/15/16 1437

## 2016-02-07 NOTE — ED Notes (Signed)
Bed: WA17 Expected date:  Expected time:  Means of arrival:  Comments: 26 yo M/ Anxiety took Haldol

## 2016-02-07 NOTE — ED Notes (Signed)
PT c/o he is having a seizure and need help. When asking PT is there anything I could help with, PT state he just need his med.RN have been made aware

## 2016-02-07 NOTE — ED Notes (Signed)
Upon rounding on patient, patient and belongings not in room. Patient last seen ambulatory with steady gait, A&Ox4 in NAD.

## 2016-02-07 NOTE — ED Provider Notes (Signed)
[facility]-EMERGENCY DEPT Provider Note   CSN: [id number] Arrival date & time: 02/07/16  2306   By signing my name below, I, Clarisse Gouge, attest that this documentation has been prepared under the direction and in the presence of Blane Ohara, MD. Electronically signed, Clarisse Gouge, ED Scribe. 02/07/16. 1:49 AM.   History   Chief Complaint Chief Complaint  Patient presents with  . Ingestion   The history is provided by the patient and medical records. No language interpreter was used.    HPI Comments: Howard Carlson is a 26 y.o. male BIB EMS with Hx of anxiety who presents to the Emergency Department complaining of ingestion. States he took medications for anxiety ~3-4 hours prior to evaluation, including 2 haldol and tylenol. Pt states he ran out of anxiety medications recently, and states his prescribed medications include ephixa, Remeron and Risperdal. Notes he has not been able to refill prescriptions recently because of time constraints. Pt currently c/o myalgias and generalized body aches, chills, vomiting, abdominal pain and SOB. Pt denies suicidal ideations, cough, headache, leg swelling and diarrhea.  Past Medical History:  Diagnosis Date  . Anxiety   . Bipolar affective (HCC)   . Schizophrenic disorder [facility])     Patient Active Problem List   Diagnosis Date Noted  . Cocaine use disorder, mild, abuse 01/20/2016  . Cannabis use disorder, mild, abuse 01/20/2016  . Chlamydia infection 01/20/2016  . Schizoaffective disorder, bipolar type (HCC) 01/13/2016  . Cocaine abuse 09/22/2014    History reviewed. No pertinent surgical history.     Home Medications    Prior to Admission medications   Medication Sig Start Date End Date Taking? Authorizing Provider  hydrOXYzine (ATARAX/VISTARIL) 25 MG tablet Take 1 tablet (25 mg total) by mouth every 6 (six) hours as needed for anxiety. 01/21/16   Beau Fanny, FNP  mirtazapine (REMERON) 15 MG tablet Take 1 tablet (15 mg  total) by mouth at bedtime. 01/21/16   Beau Fanny, FNP  nicotine polacrilex (NICORETTE) 2 MG gum Take 1 each (2 mg total) by mouth as needed for smoking cessation. 01/21/16   Beau Fanny, FNP  QUEtiapine (SEROQUEL) 100 MG tablet Take 1 tablet (100 mg total) by mouth at bedtime. 01/21/16   Beau Fanny, FNP  venlafaxine XR (EFFEXOR-XR) 75 MG 24 hr capsule Take 1 capsule (75 mg total) by mouth daily with breakfast. 01/22/16   Beau Fanny, FNP    Family History History reviewed. No pertinent family history.  Social History Social History  Substance Use Topics  . Smoking status: Light Tobacco Smoker    Types: Cigarettes  . Smokeless tobacco: Never Used  . Alcohol use No     Comment: Patient denies     Allergies   Amoxicillin   Review of Systems Review of Systems  Constitutional: Positive for chills.  Cardiovascular: Positive for chest pain.  Gastrointestinal: Positive for abdominal pain, nausea and vomiting.  Musculoskeletal: Positive for arthralgias and myalgias.  Psychiatric/Behavioral: The patient is nervous/anxious.   All other systems reviewed and are negative.    Physical Exam Updated Vital Signs BP 126/76 (BP Location: Right Arm)   Pulse 81   Temp 97.8 F (36.6 C) (Oral)   Resp 19   Ht 6' (1.829 m)   Wt 158 lb (71.7 kg)   SpO2 96%   BMI 21.43 kg/m   Physical Exam  Constitutional: He is oriented to person, place, and time. He appears well-developed and well-nourished. No distress.  HENT:  Head: Normocephalic.  Eyes: EOM are normal. Pupils are equal, round, and reactive to light.  Mild diagonal nystagmus.  Neck: Normal range of motion.  Cardiovascular: Normal rate, regular rhythm, normal heart sounds and intact distal pulses.   Pulmonary/Chest: Breath sounds normal. No respiratory distress. He has no wheezes. He has no rales. He exhibits no tenderness.  Abdominal: Soft. There is no tenderness.  Musculoskeletal: Normal range of motion.    Neurological: He is alert and oriented to person, place, and time. No cranial nerve deficit.  Skin: Skin is warm and dry.  Psychiatric:  Pt not suicidal. Answers questions appropriately.    ED Treatments / Results  DIAGNOSTIC STUDIES: Oxygen Saturation is 96% on RA, normal by my interpretation.    COORDINATION OF CARE: 1:49 AM Discussed treatment plan with pt at bedside and pt agreed to plan.  2:30 AM Pt feeling improved.  Labs (all labs ordered are listed, but only abnormal results are displayed) Labs Reviewed  COMPREHENSIVE METABOLIC PANEL - Abnormal; Notable for the following:       Result Value   Glucose, Bld 121 (*)    All other components within normal limits  CBC WITH DIFFERENTIAL/PLATELET - Abnormal; Notable for the following:    Hemoglobin 12.6 (*)    HCT 37.0 (*)    All other components within normal limits  ACETAMINOPHEN LEVEL - Abnormal; Notable for the following:    Acetaminophen (Tylenol), Serum <10 (*)    All other components within normal limits  ETHANOL  SALICYLATE LEVEL  RAPID URINE DRUG SCREEN, HOSP PERFORMED    EKG  EKG Interpretation None       Radiology No results found.  Procedures Procedures (including critical care time)  Medications Ordered in ED Medications  sodium chloride 0.9 % bolus 1,000 mL (0 mLs Intravenous Stopped 02/08/16 0126)     Initial Impression / Assessment and Plan / ED Course  I have reviewed the triage vital signs and the nursing notes.  Pertinent labs & imaging results that were available during my care of the patient were reviewed by me and considered in my medical decision making (see chart for details).  Clinical Course    Patient presents after taking his friend's medications help with his anxiety symptoms. Patient had no intent of self-harm. Patient improved to baseline and no symptoms on reassessment he was observed in the ER. Screening blood work unremarkable. Refilled patient's medications stressed not to  take anyone else's medications and follow-up outpatient.  Results and differential diagnosis were discussed with the patient/parent/guardian. Xrays were independently reviewed by myself.  Close follow up outpatient was discussed, comfortable with the plan.   Medications  sodium chloride 0.9 % bolus 1,000 mL (0 mLs Intravenous Stopped 02/08/16 0126)    Vitals:   02/07/16 2309 02/07/16 2310 02/08/16 0100  BP:  126/76 118/75  Pulse:  81 64  Resp:  19 14  Temp:  97.8 F (36.6 C)   TempSrc:  Oral   SpO2:  96% 100%  Weight: 158 lb (71.7 kg)    Height: 6' (1.829 m)      Final diagnoses:  Drug ingestion, undetermined intent, initial encounter  Anxiety     Final Clinical Impressions(s) / ED Diagnoses   Final diagnoses:  Drug ingestion, undetermined intent, initial encounter  Anxiety    New Prescriptions New Prescriptions   No medications on file     Blane OharaJoshua Grae Leathers, MD 02/08/16 [phone]

## 2016-02-07 NOTE — ED Notes (Signed)
Bed: WA21 Expected date:  Expected time:  Means of arrival:  Comments: 

## 2016-02-07 NOTE — ED Notes (Signed)
Patient tolerated PO fluids and sandwich.

## 2016-02-07 NOTE — ED Triage Notes (Signed)
Pt stated that his friend was in room 7.. Friend called family

## 2016-02-07 NOTE — ED Triage Notes (Signed)
Took 10 mg haldol from his friends medication for being anxious, no respiratory or acute distress noted resting in bed with eyes closed.

## 2016-02-07 NOTE — ED Triage Notes (Signed)
[facility], given unknown antibiotic, instructed to have recheck. Pt came to ED for the same yesterday and LWBS. No symptoms currently, never had any symptoms throughout infection.

## 2016-02-08 LAB — COMPREHENSIVE METABOLIC PANEL
ALT: 26 U/L (ref 17–63)
AST: 33 U/L (ref 15–41)
Albumin: 4.2 g/dL (ref 3.5–5.0)
Alkaline Phosphatase: 49 U/L (ref 38–126)
Anion gap: 9 (ref 5–15)
BILIRUBIN TOTAL: 0.3 mg/dL (ref 0.3–1.2)
BUN: 13 mg/dL (ref 6–20)
CHLORIDE: 106 mmol/L (ref 101–111)
CO2: 24 mmol/L (ref 22–32)
Calcium: 9.2 mg/dL (ref 8.9–10.3)
Creatinine, Ser: 1.21 mg/dL (ref 0.61–1.24)
Glucose, Bld: 121 mg/dL — ABNORMAL HIGH (ref 65–99)
Potassium: 3.8 mmol/L (ref 3.5–5.1)
Sodium: 139 mmol/L (ref 135–145)
TOTAL PROTEIN: 7.8 g/dL (ref 6.5–8.1)

## 2016-02-08 LAB — RAPID URINE DRUG SCREEN, HOSP PERFORMED
AMPHETAMINES: NOT DETECTED
Barbiturates: NOT DETECTED
Benzodiazepines: NOT DETECTED
Cocaine: POSITIVE — AB
OPIATES: NOT DETECTED
Tetrahydrocannabinol: POSITIVE — AB

## 2016-02-08 LAB — ETHANOL

## 2016-02-08 LAB — SALICYLATE LEVEL

## 2016-02-08 LAB — ACETAMINOPHEN LEVEL

## 2016-02-08 MED ORDER — HYDROXYZINE HCL 25 MG PO TABS: 25.0000 mg | ORAL_TABLET | Freq: Four times a day (QID) | ORAL | 0 refills | Status: DC | PRN

## 2016-02-08 MED ORDER — VENLAFAXINE HCL ER 75 MG PO CP24: 75.0000 mg | ORAL_CAPSULE | Freq: Every day | ORAL | 0 refills | Status: DC

## 2016-02-08 MED ORDER — QUETIAPINE FUMARATE 100 MG PO TABS: 100.0000 mg | ORAL_TABLET | Freq: Every day | ORAL | 0 refills | Status: DC

## 2016-02-08 MED ORDER — MIRTAZAPINE 15 MG PO TABS: 15.0000 mg | ORAL_TABLET | Freq: Every day | ORAL | 0 refills | Status: DC

## 2016-02-08 NOTE — ED Notes (Signed)
Pt requesting sleep medication steady gait noted alert and oriented x 3 no respiratory or acute distress noted call light in reach food and drink given.

## 2016-02-08 NOTE — ED Notes (Signed)
Pt more awake now able to use urinal asking for pain medication and food call light in reach.

## 2016-02-08 NOTE — Discharge Instructions (Signed)
Please do not take other people's medicine.  Discuss refills with your doctor.  If you were given medicines take as directed.  If you are on coumadin or contraceptives realize their levels and effectiveness is altered by many different medicines.  If you have any reaction (rash, tongues swelling, other) to the medicines stop taking and see a physician.    If your blood pressure was elevated in the ER make sure you follow up for management with a primary doctor or return for chest pain, shortness of breath or stroke symptoms.  Please follow up as directed and return to the ER or see a physician for new or worsening symptoms.  Thank you. Vitals:   02/07/16 2309 02/07/16 2310 02/08/16 0100  BP:  126/76 118/75  Pulse:  81 64  Resp:  19 14  Temp:  97.8 F (36.6 C)   TempSrc:  Oral   SpO2:  96% 100%  Weight: 158 lb (71.7 kg)    Height: 6' (1.829 m)

## 2016-02-08 NOTE — ED Notes (Signed)
No respiratory or acute distress noted resting in bed with eyes closed call light in reach no reaction to medication noted. 

## 2017-03-01 ENCOUNTER — Ambulatory Visit (HOSPITAL_COMMUNITY): Admission: EM | Admit: 2017-03-01 | Discharge: 2017-03-01 | Discharge status: Against Medical Advice | Payer: Self-pay

## 2017-03-01 NOTE — ED Notes (Signed)
Called several times no answer. 

## 2017-03-01 NOTE — ED Triage Notes (Signed)
Called for triage no answer  

## 2017-04-28 ENCOUNTER — Ambulatory Visit (INDEPENDENT_AMBULATORY_CARE_PROVIDER_SITE_OTHER): Payer: Self-pay | Admitting: Physician Assistant

## 2017-05-08 ENCOUNTER — Encounter (HOSPITAL_COMMUNITY): Payer: Self-pay

## 2017-05-08 ENCOUNTER — Emergency Department (HOSPITAL_COMMUNITY)
Admission: EM | Admit: 2017-05-08 | Discharge: 2017-05-10 | Disposition: A | Discharge status: Other Acute Inpatient Hospital | Payer: Medicaid Other | Attending: Emergency Medicine | Admitting: Emergency Medicine

## 2017-05-08 ENCOUNTER — Other Ambulatory Visit: Payer: Self-pay

## 2017-05-08 DIAGNOSIS — F17210 Nicotine dependence, cigarettes, uncomplicated: Secondary | ICD-10-CM | POA: Insufficient documentation

## 2017-05-08 DIAGNOSIS — F1910 Other psychoactive substance abuse, uncomplicated: Secondary | ICD-10-CM

## 2017-05-08 DIAGNOSIS — R45851 Suicidal ideations: Secondary | ICD-10-CM | POA: Insufficient documentation

## 2017-05-08 DIAGNOSIS — F1410 Cocaine abuse, uncomplicated: Secondary | ICD-10-CM | POA: Insufficient documentation

## 2017-05-08 DIAGNOSIS — F332 Major depressive disorder, recurrent severe without psychotic features: Secondary | ICD-10-CM | POA: Insufficient documentation

## 2017-05-08 DIAGNOSIS — F1210 Cannabis abuse, uncomplicated: Secondary | ICD-10-CM | POA: Insufficient documentation

## 2017-05-08 DIAGNOSIS — F250 Schizoaffective disorder, bipolar type: Secondary | ICD-10-CM

## 2017-05-08 DIAGNOSIS — F259 Schizoaffective disorder, unspecified: Secondary | ICD-10-CM | POA: Insufficient documentation

## 2017-05-08 DIAGNOSIS — F419 Anxiety disorder, unspecified: Secondary | ICD-10-CM | POA: Insufficient documentation

## 2017-05-08 DIAGNOSIS — F1414 Cocaine abuse with cocaine-induced mood disorder: Secondary | ICD-10-CM | POA: Diagnosis present

## 2017-05-08 LAB — COMPREHENSIVE METABOLIC PANEL
ALBUMIN: 4.5 g/dL (ref 3.5–5.0)
ALT: 33 U/L (ref 17–63)
AST: 36 U/L (ref 15–41)
Alkaline Phosphatase: 62 U/L (ref 38–126)
Anion gap: 13 (ref 5–15)
BILIRUBIN TOTAL: 1.1 mg/dL (ref 0.3–1.2)
BUN: 12 mg/dL (ref 6–20)
CHLORIDE: 98 mmol/L — AB (ref 101–111)
CO2: 24 mmol/L (ref 22–32)
CREATININE: 1.25 mg/dL — AB (ref 0.61–1.24)
Calcium: 9.4 mg/dL (ref 8.9–10.3)
GFR calc Af Amer: >60 mL/min (ref >60)
GLUCOSE: 94 mg/dL (ref 65–99)
POTASSIUM: 3.4 mmol/L — AB (ref 3.5–5.1)
Sodium: 135 mmol/L (ref 135–145)
TOTAL PROTEIN: 8.7 g/dL — AB (ref 6.5–8.1)

## 2017-05-08 LAB — RAPID URINE DRUG SCREEN, HOSP PERFORMED
AMPHETAMINES: NOT DETECTED
BARBITURATES: NOT DETECTED
BENZODIAZEPINES: NOT DETECTED
Cocaine: POSITIVE — AB
Opiates: NOT DETECTED
Tetrahydrocannabinol: POSITIVE — AB

## 2017-05-08 LAB — CBC
HEMATOCRIT: 45.5 % (ref 39.0–52.0)
Hemoglobin: 15.1 g/dL (ref 13.0–17.0)
MCH: 28.1 pg (ref 26.0–34.0)
MCHC: 33.2 g/dL (ref 30.0–36.0)
MCV: 84.7 fL (ref 78.0–100.0)
Platelets: 214 10*3/uL (ref 150–400)
RBC: 5.37 MIL/uL (ref 4.22–5.81)
RDW: 14.1 % (ref 11.5–15.5)
WBC: 5.9 10*3/uL (ref 4.0–10.5)

## 2017-05-08 LAB — ACETAMINOPHEN LEVEL: Acetaminophen (Tylenol), Serum: <10 ug/mL — ABNORMAL LOW (ref 10–30)

## 2017-05-08 LAB — ETHANOL: Alcohol, Ethyl (B): <10 mg/dL (ref <10)

## 2017-05-08 LAB — SALICYLATE LEVEL: Salicylate Lvl: <7 mg/dL (ref 2.8–30.0)

## 2017-05-08 MED ORDER — LORAZEPAM 2 MG/ML IJ SOLN
2.0000 mg | Freq: Once | INTRAMUSCULAR | Status: AC
  Administered 2017-05-08: 2 mg via INTRAMUSCULAR
  Filled 2017-05-08 (×2): qty 1

## 2017-05-08 MED ORDER — ZOLPIDEM TARTRATE 5 MG PO TABS
5.0000 mg | ORAL_TABLET | Freq: Every evening | ORAL | Status: DC | PRN
  Administered 2017-05-08: 5 mg via ORAL
  Filled 2017-05-08: qty 1

## 2017-05-08 NOTE — ED Triage Notes (Signed)
Patient is requesting detox from crack and states he is suicidal. Patient states he tried to jump off of a bridge yesterday. Patient states states he last used crack this morning and noon today. Patient states he also smokes marijuana yesterday. Patient denies HI, and states he sees shadows and hears people talking. patient states he can not make out what the people are saying to him.

## 2017-05-08 NOTE — BH Assessment (Signed)
Pt refused TTS assessment

## 2017-05-08 NOTE — ED Notes (Signed)
Pt refusing TTS assessment at current time

## 2017-05-08 NOTE — ED Provider Notes (Signed)
[facility]-EMERGENCY DEPT Provider Note   CSN: [id number] Arrival date & time: 05/08/17  1801     History   Chief Complaint No chief complaint on file.   HPI Shadeed Mintzer is a 27 y.o. male.  HPI  Patient with history of bipolar and schizophrenia presents with complaint of suicidal thoughts.  He states that he would jump off a bridge.  States he tried to jump off a bridge yesterday.  He also states he would like to get off of crack cocaine.  His last use was today.  He states he is not taking any medications currently due to being incarcerated and did not follow-up.  He denies any recent illness.  No fevers vomiting or cough.  Past Medical History:  Diagnosis Date  . Anxiety   . Bipolar affective (HCC)   . Schizophrenic disorder [facility])     Patient Active Problem List   Diagnosis Date Noted  . Cocaine use disorder, mild, abuse (HCC) 01/20/2016  . Cannabis use disorder, mild, abuse 01/20/2016  . Chlamydia infection 01/20/2016  . Schizoaffective disorder, bipolar type (HCC) 01/13/2016  . Cocaine abuse (HCC) 09/22/2014    History reviewed. No pertinent surgical history.      Home Medications    Prior to Admission medications   Medication Sig Start Date End Date Taking? Authorizing Provider  hydrOXYzine (ATARAX/VISTARIL) 25 MG tablet Take 1 tablet (25 mg total) by mouth every 6 (six) hours as needed for anxiety. Patient not taking: Reported on 05/08/2017 02/08/16   Blane Ohara, MD  mirtazapine (REMERON) 15 MG tablet Take 1 tablet (15 mg total) by mouth at bedtime. Patient not taking: Reported on 05/08/2017 02/08/16   Blane Ohara, MD  nicotine polacrilex (NICORETTE) 2 MG gum Take 1 each (2 mg total) by mouth as needed for smoking cessation. Patient not taking: Reported on 05/08/2017 01/21/16   Beau Fanny, FNP  QUEtiapine (SEROQUEL) 100 MG tablet Take 1 tablet (100 mg total) by mouth at bedtime. Patient not taking: Reported on 05/08/2017  02/08/16   Blane Ohara, MD  venlafaxine XR (EFFEXOR-XR) 75 MG 24 hr capsule Take 1 capsule (75 mg total) by mouth daily with breakfast. Patient not taking: Reported on 05/08/2017 02/08/16   Blane Ohara, MD    Family History History reviewed. No pertinent family history.  Social History Social History   Tobacco Use  . Smoking status: Current Every Day Smoker    Types: Cigarettes  . Smokeless tobacco: Never Used  Substance Use Topics  . Alcohol use: No    Comment: Patient denies  . Drug use: Yes    Types: Marijuana, "Crack" cocaine, Cocaine    Comment: daily use recently     Allergies   Amoxicillin   Review of Systems Review of Systems  ROS reviewed and all otherwise negative except for mentioned in HPI   Physical Exam Updated Vital Signs BP (!) 107/92 (BP Location: Left Arm)   Pulse 88   Temp 99.1 F (37.3 C) (Oral)   Resp 16   Ht 5\' 8"  (1.727 m)   Wt 68 kg (150 lb)   SpO2 96%   BMI 22.81 kg/m  Vitals reviewed Physical Exam  Physical Examination: General appearance - alert, well appearing, and in no distress Mental status - alert, oriented to person, place, and time Eyes - no conjunctival injection, no scleral icterus Chest - clear to auscultation, no wheezes, rales or rhonchi, symmetric air entry Heart - normal rate, regular rhythm, normal S1,  S2, no murmurs, rubs, clicks or gallops Neurological - alert, oriented, normal speech Extremities - no swelling Skin - normal coloration and turgor, no rashes Psych- poor eye contact, pressured speech   ED Treatments / Results  Labs (all labs ordered are listed, but only abnormal results are displayed) Labs Reviewed  COMPREHENSIVE METABOLIC PANEL - Abnormal; Notable for the following components:      Result Value   Potassium 3.4 (*)    Chloride 98 (*)    Creatinine, Ser 1.25 (*)    Total Protein 8.7 (*)    All other components within normal limits  ACETAMINOPHEN LEVEL - Abnormal; Notable for the following  components:   Acetaminophen (Tylenol), Serum <10 (*)    All other components within normal limits  RAPID URINE DRUG SCREEN, HOSP PERFORMED - Abnormal; Notable for the following components:   Cocaine POSITIVE (*)    Tetrahydrocannabinol POSITIVE (*)    All other components within normal limits  ETHANOL  SALICYLATE LEVEL  CBC    EKG None  Radiology No results found.  Procedures Procedures (including critical care time)  Medications Ordered in ED Medications  LORazepam (ATIVAN) injection 2 mg (has no administration in time range)  zolpidem (AMBIEN) tablet 5 mg (has no administration in time range)     Initial Impression / Assessment and Plan / ED Course  I have reviewed the triage vital signs and the nursing notes.  Pertinent labs & imaging results that were available during my care of the patient were reviewed by me and considered in my medical decision making (see chart for details).    10:30 PM  Pt medically cleared, when time for TTS he would not talk with them.  When I went to assess the patient he was very angry, punching the door, very agitated.  He states nobody can help him.  I have ordered ativan. Security was at bedside, let them know that I am filling out IVC papers due to patient's suicidal ideations.    Pt is medically clear and awaiting TTS evaluation.  Psych holding orders written.  I did not re-order his home meds as he states he has not been taking them for almost a year.    Final Clinical Impressions(s) / ED Diagnoses   Final diagnoses:  Suicidal ideation  Substance abuse [facility](HCC)    ED Discharge Orders    None       Jewelianna Pancoast, Latanya MaudlinMartha L, MD 05/08/17 2300

## 2017-05-09 DIAGNOSIS — F1414 Cocaine abuse with cocaine-induced mood disorder: Secondary | ICD-10-CM | POA: Diagnosis present

## 2017-05-09 MED ORDER — HYDROXYZINE HCL 25 MG PO TABS
25.0000 mg | ORAL_TABLET | Freq: Four times a day (QID) | ORAL | Status: DC | PRN
  Administered 2017-05-09: 25 mg via ORAL
  Filled 2017-05-09: qty 1

## 2017-05-09 MED ORDER — MIRTAZAPINE 30 MG PO TABS
15.0000 mg | ORAL_TABLET | Freq: Every day | ORAL | Status: DC
  Administered 2017-05-09: 15 mg via ORAL
  Filled 2017-05-09: qty 1

## 2017-05-09 MED ORDER — GABAPENTIN 300 MG PO CAPS
300.0000 mg | ORAL_CAPSULE | Freq: Two times a day (BID) | ORAL | Status: DC
  Administered 2017-05-09 – 2017-05-10 (×3): 300 mg via ORAL
  Filled 2017-05-09 (×3): qty 1

## 2017-05-09 MED ORDER — QUETIAPINE FUMARATE 100 MG PO TABS
100.0000 mg | ORAL_TABLET | Freq: Every day | ORAL | Status: DC
  Administered 2017-05-09: 100 mg via ORAL
  Filled 2017-05-09: qty 1

## 2017-05-09 NOTE — [facility] Counselor (Signed)
TTS writer attempted to complete assessment on patient. Patient refused to remain alert so the assessment could be completed. Patient stated, "I have not been sleep in a long time." Patient refused to speak after.  TTS will attempt to assess patient later this day.

## 2017-05-09 NOTE — ED Notes (Signed)
Patient admitted on unit. Appear anxious and irritable. Seen crying while the police officer talking to him. Patient requested that this writer prayed for him stated that's the only thing that can calmed him down. Patient reported not sleeping for the past 4 days and was using cocaine to mask the pain of his wife having sex with other men. Patient accepted to take Ativan IM and Ambien for sleep. Accepted sandwich and drinks offered.  Support and encouragement offered as needed. Routine safety checks maintained. Will continue to monitor patient for safety and stability.

## 2017-05-09 NOTE — ED Notes (Signed)
Patient currently denies SI/HI/AVH. Plan of care discussed. Encouragement and support provided and safety maintain. Q 15 min safety checks in place and video monitoring. 

## 2017-05-09 NOTE — [facility] Counselor (Addendum)
TTS writer attempted to assess patient. Patient sleep unable to be awaken. Patient given Ativan IM and Ambien during the night. Assessment will be attempted later this day.

## 2017-05-09 NOTE — BH Assessment (Signed)
Tele Assessment Note   Patient Name: Howard Carlson MRN: [id number] Referring Physician: Phillis Haggis, MD Location of [facility]: [facility] Location of Provider: Behavioral Health TTS Department  Howard Carlson is an 27 y.o. male present to [facility] with complaints of suicidal thoughts with a plan to jump off a bridge or overdose on crack cocaine. Patient report he has been on a 4-day bing of crack cocaine usage with suicidal thoughts after learning his wife was cheating on him and his wife would not allow him to see his children. Patient report, "See another man in my house was hurtful but my wife not allowing me to see my kids was the trigger for my suicidal thoughts." Patient admitted him and his wife has been having martial issues for the past year triggered by his crack cocaine addiction. Patient denies additional stressors, denies HI and AVH and currently denies SI. Patient requested inpatient treatment get his substance addiction. Patient was recently released from jail, November 2018, and report he's not on probation nor does he has any upcoming court dates.   Diagnosis: F33.2  Major depressive disorder, Recurrent episode, Severe ; F14.20   Cocaine use disorder, Severe   Past Medical History:  Past Medical History:  Diagnosis Date  . Anxiety   . Bipolar affective (HCC)   . Schizophrenic disorder (HCC)     History reviewed. No pertinent surgical history.  Family History: History reviewed. No pertinent family history.  Social History:  reports that he has been smoking cigarettes.  He has never used smokeless tobacco. He reports that he has current or past drug history. Drugs: Marijuana, "Crack" cocaine, and Cocaine. He reports that he does not drink alcohol.  Additional Social History:  Alcohol / Drug Use Pain Medications: SEE MAR Prescriptions: SEE MAR Over the Counter: SEE MAR History of alcohol / drug use?: Yes Substance #1 Name of Substance 1: crack cocaine 1 - Frequency:  daily 1 - Duration: ongoing 1 - Last Use / Amount: 05/08/2017  CIWA: CIWA-Ar BP: (!) 148/92 Pulse Rate: 86 COWS:    Allergies:  Allergies  Allergen Reactions  . Amoxicillin Anaphylaxis and Other (See Comments)    Has patient had a PCN reaction causing immediate rash, facial/tongue/throat swelling, SOB or lightheadedness with hypotension: Yes Has patient had a PCN reaction causing severe rash involving mucus membranes or skin necrosis: No Has patient had a PCN reaction that required hospitalization No Has patient had a PCN reaction occurring within the last 10 years: No If all of the above answers are "NO", then may proceed with Cephalosporin use.     Home Medications:  (Not in a hospital admission)  OB/GYN Status:  No LMP for male patient.  General Assessment Data Assessment unable to be completed: Yes Reason for not completing assessment: Pt refused assessment [facility] of Assessment: [facility] TTS Assessment: In system Is this a Tele or Face-to-Face Assessment?: Face-to-Face Is this an Initial Assessment or a Re-assessment for this encounter?: Initial Assessment Marital status: Married [place] name: Shuler Is patient pregnant?: No Pregnancy Status: No Living Arrangements: Spouse/significant other Can pt return to current living arrangement?: No Admission Status: Involuntary Is patient capable of signing voluntary admission?: No Referral Source: Self/Family/Friend Insurance type: Medicaid     Crisis Care Plan Living Arrangements: Spouse/significant other Legal Guardian: Other:(self) Name of Psychiatrist: pt denies Name of Therapist: pt denies  Education Status Is patient currently in school?: No Is the patient employed, unemployed or receiving disability?: Receiving disability income  Risk to self  with the past 6 months Suicidal Ideation: Yes-Currently Present Has patient been a risk to self within the past 6 months prior to admission? : No Suicidal Intent:  Yes-Currently Present(attempted to overdose on crack cocaine) Has patient had any suicidal intent within the past 6 months prior to admission? : No Is patient at risk for suicide?: No Suicidal Plan?: Yes-Currently Present Has patient had any suicidal plan within the past 6 months prior to admission? : No Specify Current Suicidal Plan: attempted to overdose on crack cocaine, had thoughts of jumping off a bridge Access to Means: Yes(access to bridges and crack cocaine) Specify Access to Suicidal Means: access to bridges and crack cocaine What has been your use of drugs/alcohol within the last 12 months?: crack cocaine Previous Attempts/Gestures: No How many times?: 0 Other Self Harm Risks: none reported Triggers for Past Attempts: None known Intentional Self Injurious Behavior: None Family Suicide History: No Recent stressful life event(s): Conflict (Comment)(separation from wife, learned wife is cheating) Persecutory voices/beliefs?: No Depression: Yes Depression Symptoms: Insomnia, Guilt, Feeling worthless/self pity(substance use, suicidal ) Substance abuse history and/or treatment for substance abuse?: Yes Suicide prevention information given to non-admitted patients: Not applicable  Risk to Others within the past 6 months Homicidal Ideation: No Does patient have any lifetime risk of violence toward others beyond the six months prior to admission? : No Thoughts of Harm to Others: No Current Homicidal Intent: No Current Homicidal Plan: No Access to Homicidal Means: No Identified Victim: n/a History of harm to others?: No Assessment of Violence: None Noted Violent Behavior Description: none noted Does patient have access to weapons?: No Criminal Charges Pending?: No Does patient have a court date: No Is patient on probation?: No  Psychosis Hallucinations: None noted Delusions: None noted  Mental Status Report Appearance/Hygiene: In scrubs Eye Contact: Good Motor Activity:  Freedom of movement Speech: Logical/coherent Level of Consciousness: Alert Mood: Pleasant Affect: Inconsistent with thought content Anxiety Level: None Thought Processes: Coherent, Relevant Judgement: Unimpaired Orientation: Person, Place, Time, Situation Obsessive Compulsive Thoughts/Behaviors: None  Cognitive Functioning Concentration: Normal Memory: Recent Intact, Remote Intact Is patient IDD: No Is patient DD?: No Insight: Fair Impulse Control: Poor Appetite: Poor Have you had any weight changes? : No Change Sleep: Decreased  ADLScreening [facility]) Patient's cognitive ability adequate to safely complete daily activities?: Yes Patient able to express need for assistance with ADLs?: Yes Independently performs ADLs?: Yes (appropriate for developmental age)  Prior Inpatient Therapy Prior Inpatient Therapy: Yes Prior Therapy Dates: 2018 [facility] Reason for Treatment: mental health   Prior Outpatient Therapy Prior Outpatient Therapy: No Does patient have an ACCT team?: No Does patient have Intensive In-House Services?  : No Does patient have [facility] services? : No Does patient have P4[facility] services?: No  ADL Screening (condition at time of admission) Patient's cognitive ability adequate to safely complete daily activities?: Yes Is the patient deaf or have difficulty hearing?: No Does the patient have difficulty seeing, even when wearing glasses/contacts?: No Does the patient have difficulty concentrating, remembering, or making decisions?: No Patient able to express need for assistance with ADLs?: Yes Does the patient have difficulty dressing or bathing?: No Independently performs ADLs?: Yes (appropriate for developmental age) Does the patient have difficulty walking or climbing stairs?: No       Abuse/Neglect Assessment (Assessment to be complete while patient is alone) Abuse/Neglect Assessment Can Be Completed:  Yes Physical Abuse: Denies Verbal Abuse: Denies Sexual Abuse: Denies Exploitation of patient/patient's resources:  Denies Self-Neglect: Denies     Merchant navy officer (For Healthcare) Does Patient Have a Medical Advance Directive?: No Would patient like information on creating a medical advance directive?: No - Patient declined    Additional Information 1:1 In Past 12 Months?: No CIRT Risk: No Elopement Risk: No Does patient have medical clearance?: No     Disposition:  Disposition Initial Assessment Completed for this Encounter: Yes Disposition of Patient: Admit(Dr. A & Parks, NP, observe overnight )   Lourine Alberico 05/09/2017 4:16 PM

## 2017-05-09 NOTE — Patient Outreach (Signed)
ED Peer Support Specialist Patient Intake (Complete at intake & 30-60 Day Follow-up)  Name: Howard PhillipsDeontae Carlson  MRN: [id number]  Age: [age] y.o.   Date of Admission: 05/09/2017  Intake: Initial Comments:      Primary Reason Admitted: crack cocaine use, depression, SI  Lab values: Alcohol/ETOH: Negative Positive UDS? Yes Amphetamines: No Barbiturates: No Benzodiazepines: No Cocaine: Yes Opiates: No Cannabinoids: Yes  Demographic information: Gender: Male Ethnicity: African American Marital Status: Married Insurance Status: Medicaid Control and instrumentation engineereceives non-medical governmental assistance (Work Engineer, agriculturalirst/Welfare, Sales executivefood stamps, etc.: No Lives with: Partner/Spouse Living situation: House/Apartment  Reported Patient History: Patient reported health conditions: Other (comment), Depression, Bipolar disorder, Schizophrenia(anxiety) Patient aware of HIV and hepatitis status: Yes (comment)(Negative)  In past year, has patient visited ED for any reason? Yes  Number of ED visits: 1  Reason(s) for visit: anxiety  In past year, has patient been hospitalized for any reason? No  Number of hospitalizations:    Reason(s) for hospitalization:    In past year, has patient been arrested? Yes  Number of arrests: 1  Reason(s) for arrest: probation violation  In past year, has patient been incarcerated? Yes  Number of incarcerations: 1  Reason(s) for incarceration: probation violation  In past year, has patient received medication-assisted treatment?    In past year, patient received the following treatments: Individual therapy([facility])  In past year, has patient received any harm reduction services? No  Did this include any of the following?    In past year, has patient received care from a mental health provider for diagnosis other than SUD? Yes([facility])  In past year, is this first time patient has overdosed? (has not overdosed)  Number of past overdoses:    In past  year, is this first time patient has been hospitalized for an overdose? (has not overdosed)  Number of hospitalizations for overdose(s):    Is patient currently receiving treatment for a mental health diagnosis? No  Patient reports experiencing difficulty participating in SUD treatment: No    Most important reason(s) for this difficulty?    Has patient received prior services for treatment? Yes  In past, patient has received services from following agencies:    Plan of Care:  Suggested follow up at these agencies/treatment centers: (Patient is interested in residential substance use treatment. [facility] when [facility] staff is open.  Patient also wants to get connected with [facility]. )  Other information: CPSS will follow up with the patient tomorrow morning in order to help the patient gain access to residential substance use treatment.    Bartholomew BoardsJohn Lowell Mcgurk, CPSS  05/09/2017 6:48 PM

## 2017-05-09 NOTE — ED Notes (Signed)
Patient talking loudly on phone with his mother.  Had to be quieted down by tech.

## 2017-05-09 NOTE — ED Notes (Signed)
Patient talking with Peer Support.

## 2017-05-09 NOTE — ED Notes (Signed)
TTS at bedside. 

## 2017-05-09 NOTE — ED Notes (Signed)
Pt talking on hallway phone.  

## 2017-05-09 NOTE — ED Notes (Signed)
Pt compliant with morning medication regimen, but uninterested in talking to this nurse. Pt reports "I just want to sleep." Limited assessment d/t pt unwilling to participate and answer this nurses questions. Encouragement and support provided. Special checks q 15 mins in place for safety, Video monitoring in place. Will continue to monitor.

## 2017-05-10 ENCOUNTER — Inpatient Hospital Stay (HOSPITAL_COMMUNITY)
Admission: AD | Admit: 2017-05-10 | Discharge: 2017-05-14 | DRG: 885 | Disposition: A | Discharge status: Home / Self Care | Payer: Medicaid Other | Source: Intra-hospital | Attending: Psychiatry | Admitting: Psychiatry

## 2017-05-10 ENCOUNTER — Encounter (HOSPITAL_COMMUNITY): Payer: Self-pay | Admitting: *Deleted

## 2017-05-10 ENCOUNTER — Other Ambulatory Visit: Payer: Self-pay

## 2017-05-10 DIAGNOSIS — F1410 Cocaine abuse, uncomplicated: Secondary | ICD-10-CM | POA: Diagnosis present

## 2017-05-10 DIAGNOSIS — Z880 Allergy status to penicillin: Secondary | ICD-10-CM | POA: Diagnosis not present

## 2017-05-10 DIAGNOSIS — Z560 Unemployment, unspecified: Secondary | ICD-10-CM | POA: Diagnosis not present

## 2017-05-10 DIAGNOSIS — F419 Anxiety disorder, unspecified: Secondary | ICD-10-CM | POA: Diagnosis present

## 2017-05-10 DIAGNOSIS — F1414 Cocaine abuse with cocaine-induced mood disorder: Secondary | ICD-10-CM

## 2017-05-10 DIAGNOSIS — Z79899 Other long term (current) drug therapy: Secondary | ICD-10-CM | POA: Diagnosis not present

## 2017-05-10 DIAGNOSIS — G4700 Insomnia, unspecified: Secondary | ICD-10-CM

## 2017-05-10 DIAGNOSIS — Z630 Problems in relationship with spouse or partner: Secondary | ICD-10-CM | POA: Diagnosis not present

## 2017-05-10 DIAGNOSIS — R45851 Suicidal ideations: Secondary | ICD-10-CM | POA: Diagnosis not present

## 2017-05-10 DIAGNOSIS — F250 Schizoaffective disorder, bipolar type: Secondary | ICD-10-CM | POA: Diagnosis not present

## 2017-05-10 DIAGNOSIS — R451 Restlessness and agitation: Secondary | ICD-10-CM | POA: Diagnosis not present

## 2017-05-10 DIAGNOSIS — F17210 Nicotine dependence, cigarettes, uncomplicated: Secondary | ICD-10-CM | POA: Diagnosis present

## 2017-05-10 DIAGNOSIS — R450 Nervousness: Secondary | ICD-10-CM | POA: Diagnosis not present

## 2017-05-10 DIAGNOSIS — F1210 Cannabis abuse, uncomplicated: Secondary | ICD-10-CM | POA: Diagnosis not present

## 2017-05-10 MED ORDER — MIRTAZAPINE 15 MG PO TABS
15.0000 mg | ORAL_TABLET | Freq: Every day | ORAL | Status: DC
  Administered 2017-05-10 – 2017-05-13 (×4): 15 mg via ORAL
  Filled 2017-05-10 (×7): qty 1

## 2017-05-10 MED ORDER — QUETIAPINE FUMARATE 100 MG PO TABS
100.0000 mg | ORAL_TABLET | Freq: Every day | ORAL | Status: DC
  Administered 2017-05-10: 100 mg via ORAL
  Filled 2017-05-10 (×3): qty 1

## 2017-05-10 MED ORDER — ACETAMINOPHEN 325 MG PO TABS
650.0000 mg | ORAL_TABLET | Freq: Four times a day (QID) | ORAL | Status: DC | PRN
  Administered 2017-05-13 (×2): 650 mg via ORAL
  Filled 2017-05-10 (×2): qty 2

## 2017-05-10 MED ORDER — HYDROXYZINE HCL 25 MG PO TABS
25.0000 mg | ORAL_TABLET | Freq: Four times a day (QID) | ORAL | Status: DC | PRN
  Administered 2017-05-12 – 2017-05-13 (×2): 25 mg via ORAL
  Filled 2017-05-10 (×2): qty 1

## 2017-05-10 MED ORDER — GABAPENTIN 300 MG PO CAPS
300.0000 mg | ORAL_CAPSULE | Freq: Two times a day (BID) | ORAL | Status: DC
  Administered 2017-05-10 – 2017-05-14 (×8): 300 mg via ORAL
  Filled 2017-05-10 (×13): qty 1

## 2017-05-10 MED ORDER — MAGNESIUM HYDROXIDE 400 MG/5ML PO SUSP: 30.0000 mL | Freq: Every day | ORAL | Status: DC | PRN

## 2017-05-10 MED ORDER — ALUM & MAG HYDROXIDE-SIMETH 200-200-20 MG/5ML PO SUSP
30.0000 mL | ORAL | Status: DC | PRN
  Administered 2017-05-13: 30 mL via ORAL
  Filled 2017-05-10: qty 30

## 2017-05-10 NOTE — Progress Notes (Signed)
Adult Psychoeducational Group Note  Date:  05/10/2017 Time:  9:39 PM  Group Topic/Focus:  Wrap-Up Group:   The focus of this group is to help patients review their daily goal of treatment and discuss progress on daily workbooks.  Participation Level:  Active  Participation Quality:  Appropriate  Affect:  Appropriate  Cognitive:  Oriented  Insight: Appropriate  Engagement in Group:  Engaged  Modes of Intervention:  Socialization and Support  Additional Comments:  Patient attended and participated in group tonight.  He report having a good day. He was a little depress today He has been thinking about things that caused him to be here. The patient advised  that when you know better and don't do better its bad on you. To day he visited with his kids and wife. The visit went well. It help him to focus.  Lita MainsFrancis, Viney Ac[facility]ella [facility] 05/10/2017, 9:39 PM

## 2017-05-10 NOTE — Progress Notes (Signed)
Esaw DaceDeontae is a 27 year old male pt admitted on involuntary basis. On admission, he reports that he has been feeling sad and depressed and spoke about how his wife has been cheating on him. He reports that he went on a crack binge for few days and reported that he was planning to overdose on crack. He reports that he has been having marital issues, and reports that he is supposed to be on medications but reports that he has not been taking them like he should. He denies any SI on admission and is able to contract for safety while in the hospital. He does endorse paranoia and reports that the crack makes those feelings worse. He reports that he would like to get back on some medications while he is here and reports that he would like some counseling as well. He reports that he was living with his wife but reports that he is unsure of where he will go once he is discharged. Nykeem was oriented to the unit and safety maintained.

## 2017-05-10 NOTE — Tx Team (Signed)
Initial Treatment Plan 05/10/2017 5:10 PM Howard Carlson [id number]    PATIENT STRESSORS: Marital or family conflict Medication change or noncompliance Substance abuse   PATIENT STRENGTHS: Ability for insight Average or above average intelligence Capable of independent living General fund of knowledge Motivation for treatment/growth   PATIENT IDENTIFIED PROBLEMS: Depression Substance Abuse Paranoia Suicidal thoughts "Get on some meds, get counseling and have someone I can talk to"                     DISCHARGE CRITERIA:  Ability to meet basic life and health needs Improved stabilization in mood, thinking, and/or behavior Reduction of life-threatening or endangering symptoms to within safe limits Verbal commitment to aftercare and medication compliance  PRELIMINARY DISCHARGE PLAN: Attend aftercare/continuing care group  PATIENT/FAMILY INVOLVEMENT: This treatment plan has been presented to and reviewed with the patient, Howard Carlson, and/or family member, .  The patient and family have been given the opportunity to ask questions and make suggestions.  Steed Kanaan, [place], [place] 05/10/2017, 5:10 PM

## 2017-05-10 NOTE — BH Assessment (Signed)
[facility] Assessment Progress Note  Per Thedore MinsMojeed Akintayo, MD, this pt requires psychiatric hospitalization.  Howard Heinrichina Tate, RN, [id number]; [facility] will be ready to receive pt at 16:00.  Pt presents under IVC initiated by EDP Delbert PhenixMartha Mabe, MD, and [facility].  Pt's nurse, Angelique BlonderDenise, has been notified, and agrees to call report to [phone].  Pt is to be transported via Patent examinerlaw enforcement.   Howard Carlson, [place] Behavioral Health Coordinator [phone]

## 2017-05-10 NOTE — Progress Notes (Signed)
Nursing Progress Note: 7p-7a D: Pt currently presents with a silly/childlike/anxious affect and behavior. Pt states "I don't know how I can sleep. I am super anxious. These meds never help me. What I need to do to get a shot?" Interacting appropriately with the milieu. Pt reports good sleep during the previous night with current medication regimen. Pt did attend wrap-up group.  A: Pt provided with medications per providers orders. Pt's labs and vitals were monitored throughout the night. Pt supported emotionally and encouraged to express concerns and questions. Pt educated on medications.  R: Pt's safety ensured with 15 minute and environmental checks. Pt currently denies SI, HI, and AVH. Pt verbally contracts to seek staff if SI,HI, or AVH occurs and to consult with staff before acting on any harmful thoughts. Will continue to monitor.

## 2017-05-10 NOTE — Consult Note (Addendum)
[facility] Face-to-Face Psychiatry Consult   Reason for Consult:  Suicidal ideation Referring Physician:  EDP Patient Identification: Howard Carlson MRN:  [id number] Principal Diagnosis: Cocaine abuse with cocaine-induced mood disorder [facility]) Diagnosis:   Patient Active Problem List   Diagnosis Date Noted  . Cocaine abuse with cocaine-induced mood disorder ([place]) [F14.14] 05/09/2017  . Cocaine use disorder, mild, abuse ([place]) [F14.10] 01/20/2016  . Cannabis use disorder, mild, abuse [F12.10] 01/20/2016  . Chlamydia infection [A74.9] 01/20/2016  . Schizoaffective disorder, bipolar type ([place]) [F25.0] 01/13/2016  . Cocaine abuse ([place]) [F14.10] 09/22/2014    Total Time spent with patient: 45 minutes  Subjective:   Howard Carlson is a 27 y.o. male patient admitted with suicidal ideation.  HPI:  Pt was seen and chart reviewed with treatment team and Dr Darleene Cleaver. Pt stated he came to the [facility] because he wanted to jump off a bridge. Pt has a history of schizoaffective disorder with auditory and visual hallucinations. Pt is not taking any medications but is self-medicating with cocaine and THC. Pt endorses suicidal ideation but denies hearing voices or seeing shadows today. Pt stated he had not slept for four days prior to coming to the [facility]. Pt stated he lives with his wife and 4 children ages; 35, 66, 1 & 2. Pt is not currently working. Pt would benefit from an inpatient psychiatric hospitalization for crisis stabilization and medication management.   Past Psychiatric History: As above  Risk to Self: Suicidal Ideation: Yes-Currently Present Suicidal Intent: Yes-Currently Present(attempted to overdose on crack cocaine) Is patient at risk for suicide?: No Suicidal Plan?: Yes-Currently Present Specify Current Suicidal Plan: attempted to overdose on crack cocaine, had thoughts of jumping off a bridge Access to Means: Yes(access to bridges and crack cocaine) Specify Access to Suicidal Means: access  to bridges and crack cocaine What has been your use of drugs/alcohol within the last 12 months?: crack cocaine How many times?: 0 Other Self Harm Risks: none reported Triggers for Past Attempts: None known Intentional Self Injurious Behavior: None Risk to Others: Homicidal Ideation: No Thoughts of Harm to Others: No Current Homicidal Intent: No Current Homicidal Plan: No Access to Homicidal Means: No Identified Victim: n/a History of harm to others?: No Assessment of Violence: None Noted Violent Behavior Description: none noted Does patient have access to weapons?: No Criminal Charges Pending?: No Does patient have a court date: No Prior Inpatient Therapy: Prior Inpatient Therapy: Yes Prior Therapy Dates: 2018 Prior Therapy Facilty/Provider(s): [facility] Reason for Treatment: mental health  Prior Outpatient Therapy: Prior Outpatient Therapy: No Does patient have an ACCT team?: No Does patient have Intensive In-House Services?  : No Does patient have [facility] services? : No Does patient have P4CC services?: No  Past Medical History:  Past Medical History:  Diagnosis Date  . Anxiety   . Bipolar affective ([place])   . Schizophrenic disorder ([place])    History reviewed. No pertinent surgical history. Family History: History reviewed. No pertinent family history. Family Psychiatric  History: Unknown Social History:  Social History   Substance and Sexual Activity  Alcohol Use No   Comment: Patient denies     Social History   Substance and Sexual Activity  Drug Use Yes  . Types: Marijuana, "Crack" cocaine, Cocaine   Comment: daily use recently    Social History   Socioeconomic History  . Marital status: Married    Spouse name: Not on file  . Number of children: Not on file  . Years of education: Not on  file  . Highest education level: Not on file  Occupational History  . Not on file  Social Needs  . Financial resource strain: Not on file  . Food insecurity:    Worry: Not  on file    Inability: Not on file  . Transportation needs:    Medical: Not on file    Non-medical: Not on file  Tobacco Use  . Smoking status: Current Every Day Smoker    Types: Cigarettes  . Smokeless tobacco: Never Used  Substance and Sexual Activity  . Alcohol use: No    Comment: Patient denies  . Drug use: Yes    Types: Marijuana, "Crack" cocaine, Cocaine    Comment: daily use recently  . Sexual activity: Yes  Lifestyle  . Physical activity:    Days per week: Not on file    Minutes per session: Not on file  . Stress: Not on file  Relationships  . Social connections:    Talks on phone: Not on file    Gets together: Not on file    Attends religious service: Not on file    Active member of club or organization: Not on file    Attends meetings of clubs or organizations: Not on file    Relationship status: Not on file  Other Topics Concern  . Not on file  Social History Narrative  . Not on file   Additional Social History:    Allergies:   Allergies  Allergen Reactions  . Amoxicillin Anaphylaxis and Other (See Comments)    Has patient had a PCN reaction causing immediate rash, facial/tongue/throat swelling, SOB or lightheadedness with hypotension: Yes Has patient had a PCN reaction causing severe rash involving mucus membranes or skin necrosis: No Has patient had a PCN reaction that required hospitalization No Has patient had a PCN reaction occurring within the last 10 years: No If all of the above answers are "NO", then may proceed with Cephalosporin use.     Labs:  Results for orders placed or performed during the hospital encounter of 05/08/17 (from the past 48 hour(s))  Comprehensive metabolic panel     Status: Abnormal   Collection Time: 05/08/17  8:20 PM  Result Value Ref Range   Sodium 135 135 - 145 mmol/L   Potassium 3.4 (L) 3.5 - 5.1 mmol/L   Chloride 98 (L) 101 - 111 mmol/L   CO2 24 22 - 32 mmol/L   Glucose, Bld 94 65 - 99 mg/dL   BUN 12 6 - 20 mg/dL    Creatinine, Ser 1.25 (H) 0.61 - 1.24 mg/dL   Calcium 9.4 8.9 - 10.3 mg/dL   Total Protein 8.7 (H) 6.5 - 8.1 g/dL   Albumin 4.5 3.5 - 5.0 g/dL   AST 36 15 - 41 U/L   ALT 33 17 - 63 U/L   Alkaline Phosphatase 62 38 - 126 U/L   Total Bilirubin 1.1 0.3 - 1.2 mg/dL   GFR calc non Af Amer >60 >60 mL/min   GFR calc Af Amer >60 >60 mL/min    Comment: (NOTE) The eGFR has been calculated using the CKD EPI equation. This calculation has not been validated in all clinical situations. eGFR's persistently <60 mL/min signify possible Chronic Kidney Disease.    Anion gap 13 5 - 15    Comment: Performed at [facility], [place] [address] [place], [place] [address]  Ethanol     Status: None   Collection Time: 05/08/17  8:20 PM  Result Value Ref Range   Alcohol, Ethyl (B) <10 <10 mg/dL    Comment:        LOWEST DETECTABLE LIMIT FOR SERUM ALCOHOL IS 10 mg/dL FOR MEDICAL PURPOSES ONLY Performed at [facility] [address], [place], [place] [address]   Salicylate level     Status: None   Collection Time: 05/08/17  8:20 PM  Result Value Ref Range   Salicylate Lvl <6.0 2.8 - 30.0 mg/dL    Comment: Performed at [facility], [place] [address], [place], [place] [address]  Acetaminophen level     Status: Abnormal   Collection Time: 05/08/17  8:20 PM  Result Value Ref Range   Acetaminophen (Tylenol), Serum <10 (L) 10 - 30 ug/mL    Comment:        THERAPEUTIC CONCENTRATIONS VARY SIGNIFICANTLY. A RANGE OF 10-30 ug/mL MAY BE AN EFFECTIVE CONCENTRATION FOR MANY PATIENTS. HOWEVER, SOME ARE BEST TREATED AT CONCENTRATIONS OUTSIDE THIS RANGE. ACETAMINOPHEN CONCENTRATIONS >150 ug/mL AT 4 HOURS AFTER INGESTION AND >50 ug/mL AT 12 HOURS AFTER INGESTION ARE OFTEN ASSOCIATED WITH TOXIC REACTIONS. Performed at [facility], [place] [address] [place], [place] [address]   cbc     Status: None   Collection Time: 05/08/17  8:20 PM   Result Value Ref Range   WBC 5.9 4.0 - 10.5 K/uL   RBC 5.37 4.22 - 5.81 MIL/uL   Hemoglobin 15.1 13.0 - 17.0 g/dL   HCT 45.5 39.0 - 52.0 %   MCV 84.7 78.0 - 100.0 fL   MCH 28.1 26.0 - 34.0 pg   MCHC 33.2 30.0 - 36.0 g/dL   RDW 14.1 11.5 - 15.5 %   Platelets 214 150 - 400 K/uL    Comment: Performed at [facility], [place] [address], [place], [place] [address]  Rapid urine drug screen (hospital performed)     Status: Abnormal   Collection Time: 05/08/17  8:58 PM  Result Value Ref Range   Opiates NONE DETECTED NONE DETECTED   Cocaine POSITIVE (A) NONE DETECTED   Benzodiazepines NONE DETECTED NONE DETECTED   Amphetamines NONE DETECTED NONE DETECTED   Tetrahydrocannabinol POSITIVE (A) NONE DETECTED   Barbiturates NONE DETECTED NONE DETECTED    Comment: (NOTE) DRUG SCREEN FOR MEDICAL PURPOSES ONLY.  IF CONFIRMATION IS NEEDED FOR ANY PURPOSE, NOTIFY LAB WITHIN 5 DAYS. LOWEST DETECTABLE LIMITS FOR URINE DRUG SCREEN Drug Class                     Cutoff (ng/mL) Amphetamine and metabolites    1000 Barbiturate and metabolites    200 Benzodiazepine                 562 Tricyclics and metabolites     300 Opiates and metabolites        300 Cocaine and metabolites        300 THC                            50 Performed at [facility], [place] [address] [place], [place] [address]     Current Facility-Administered Medications  Medication Dose Route Frequency Provider Last Rate Last Dose  . gabapentin (NEURONTIN) capsule 300 mg  300 mg Oral BID Darleene Cleaver, Rosan Calbert, MD   300 mg at 05/10/17 1032  . hydrOXYzine (ATARAX/VISTARIL) tablet 25 mg  25 mg Oral Q6H PRN Corena Pilgrim, MD  25 mg at 05/09/17 2123  . mirtazapine (REMERON) tablet 15 mg  15 mg Oral QHS Fread Kottke, MD   15 mg at 05/09/17 2123  . QUEtiapine (SEROQUEL) tablet 100 mg  100 mg Oral QHS Dwaine Pringle, MD   100 mg at 05/09/17 2123   Current Outpatient Medications  Medication Sig  Dispense Refill  . hydrOXYzine (ATARAX/VISTARIL) 25 MG tablet Take 1 tablet (25 mg total) by mouth every 6 (six) hours as needed for anxiety. (Patient not taking: Reported on 05/08/2017) 30 tablet 0  . mirtazapine (REMERON) 15 MG tablet Take 1 tablet (15 mg total) by mouth at bedtime. (Patient not taking: Reported on 05/08/2017) 30 tablet 0  . nicotine polacrilex (NICORETTE) 2 MG gum Take 1 each (2 mg total) by mouth as needed for smoking cessation. (Patient not taking: Reported on 05/08/2017) 100 tablet 0  . QUEtiapine (SEROQUEL) 100 MG tablet Take 1 tablet (100 mg total) by mouth at bedtime. (Patient not taking: Reported on 05/08/2017) 30 tablet 0  . venlafaxine XR (EFFEXOR-XR) 75 MG 24 hr capsule Take 1 capsule (75 mg total) by mouth daily with breakfast. (Patient not taking: Reported on 05/08/2017) 30 capsule 0    Musculoskeletal: Strength & Muscle Tone: within normal limits Gait & Station: normal Patient leans: N/A  Psychiatric Specialty Exam: Physical Exam  Constitutional: He is oriented to person, place, and time. He appears well-developed and well-nourished.  HENT:  Head: Normocephalic.  Respiratory: Effort normal.  Musculoskeletal: Normal range of motion.  Neurological: He is alert and oriented to person, place, and time.  Psychiatric: His speech is normal. He is slowed. Cognition and memory are normal. He expresses impulsivity. He exhibits a depressed mood. He expresses suicidal ideation. He expresses suicidal plans.    Review of Systems  Psychiatric/Behavioral: Positive for depression, hallucinations (auditory and visual), substance abuse and suicidal ideas. Negative for memory loss. The patient has insomnia. The patient is not nervous/anxious.     Blood pressure 121/65, pulse 60, temperature 97.9 F (36.6 C), temperature source Oral, resp. rate 12, height _0  (1.727 m), weight 150 lb (68 kg), SpO2 99 %.Body mass index is 22.81 kg/m.  General Appearance: Casual  Eye Contact:   Fair  Speech:  Clear and Coherent  Volume:  Decreased  Mood:  Depressed  Affect:  Congruent and Depressed  Thought Process:  Coherent  Orientation:  Full (Time, Place, and Person)  Thought Content:  Logical  Suicidal Thoughts:  Yes.  with intent/plan  Homicidal Thoughts:  No  Memory:  Immediate;   Good Recent;   Good Remote;   Fair  Judgement:  Poor  Insight:  Present  Psychomotor Activity:  Normal  Concentration:  Concentration: Fair and Attention Span: Fair  Recall:  Good  Fund of Knowledge:  Good  Language:  Good  Akathisia:  No  Handed:  Right  AIMS (if indicated):     Assets:  Communication Skills Housing Intimacy Physical Health  ADL's:  Intact  Cognition:  WNL  Sleep:        Treatment Plan Summary: Daily contact with patient to assess and evaluate symptoms and progress in treatment and Medication management (see MAR )  Disposition: Recommend psychiatric Inpatient admission when medically cleared.  Ethelene Hal, NP 05/10/2017 11:32 AM  Patient seen face-to-face for psychiatric evaluation, chart reviewed and case discussed with the physician extender and developed treatment plan. Reviewed the information documented and agree with the treatment plan. Corena Pilgrim, MD

## 2017-05-10 NOTE — ED Notes (Signed)
Patient denies pain and is resting comfortably.  

## 2017-05-11 DIAGNOSIS — F1410 Cocaine abuse, uncomplicated: Secondary | ICD-10-CM

## 2017-05-11 DIAGNOSIS — R451 Restlessness and agitation: Secondary | ICD-10-CM

## 2017-05-11 DIAGNOSIS — F17210 Nicotine dependence, cigarettes, uncomplicated: Secondary | ICD-10-CM

## 2017-05-11 DIAGNOSIS — F1210 Cannabis abuse, uncomplicated: Secondary | ICD-10-CM

## 2017-05-11 DIAGNOSIS — R450 Nervousness: Secondary | ICD-10-CM

## 2017-05-11 DIAGNOSIS — R45851 Suicidal ideations: Secondary | ICD-10-CM

## 2017-05-11 DIAGNOSIS — F419 Anxiety disorder, unspecified: Secondary | ICD-10-CM

## 2017-05-11 DIAGNOSIS — F250 Schizoaffective disorder, bipolar type: Principal | ICD-10-CM

## 2017-05-11 DIAGNOSIS — Z630 Problems in relationship with spouse or partner: Secondary | ICD-10-CM

## 2017-05-11 MED ORDER — QUETIAPINE FUMARATE 50 MG PO TABS
150.0000 mg | ORAL_TABLET | Freq: Every day | ORAL | Status: DC
  Administered 2017-05-11 – 2017-05-13 (×3): 150 mg via ORAL
  Filled 2017-05-11 (×5): qty 1

## 2017-05-11 MED ORDER — NALTREXONE HCL 50 MG PO TABS
50.0000 mg | ORAL_TABLET | Freq: Every day | ORAL | Status: DC
  Administered 2017-05-11 – 2017-05-14 (×4): 50 mg via ORAL
  Filled 2017-05-11 (×7): qty 1

## 2017-05-11 NOTE — BHH Group Notes (Signed)
LCSW Group Therapy 05/11/2017 1:15pm  Type of Therapy and Topic:  Group Therapy:  Change and Accountability  Participation Level:  None  Description of Group In this group, patients discussed power and accountability for change.  The group identified the challenges related to accountability and the difficulty of accepting the outcomes of negative behaviors.  Patients were encouraged to openly discuss a challenge/change they could take responsibility for.  Patients discussed the use of "change talk" and positive thinking as ways to support achievement of personal goals.  The group discussed ways to give support and empowerment to peers.  Therapeutic Goals: 1. Patients will state the relationship between personal power and accountability in the change process 2. Patients will identify the positive and negative consequences of a personal choice they have made 3. Patients will identify one challenge/choice they will take responsibility for making 4. Patients will discuss the role of "change talk" and the impact of positive thinking as it supports successful personal change 5. Patients will verbalize support and affirmation of change efforts in peers  Summary of Patient Progress:  Harshith attended group but slept in the chair the entire time and did not share anything at the end of the group.   Therapeutic Modalities Solution Focused Brief Therapy Motivational Interviewing Cognitive Behavioral Therapy  Carlynn Heraldngel M Akul Leggette, Student-Social Work 05/11/2017 1:23 PM
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
## 2017-05-11 NOTE — H&P (Signed)
Psychiatric Admission Assessment Adult  Patient Identification: Howard Carlson MRN:  [id number] Date of Evaluation:  05/11/2017 Chief Complaint:  schizoaffective disorder bipolar type cocaine use disorder Principal Diagnosis: Schizoaffective disorder, bipolar type (HCC) Diagnosis:   Patient Active Problem List   Diagnosis Date Noted  . Cocaine abuse with cocaine-induced mood disorder (HCC) [F14.14] 05/09/2017  . Cocaine use disorder, mild, abuse (HCC) [F14.10] 01/20/2016  . Cannabis use disorder, mild, abuse [F12.10] 01/20/2016  . Chlamydia infection [A74.9] 01/20/2016  . Schizoaffective disorder, bipolar type (HCC) [F25.0] 01/13/2016  . Cocaine abuse [facility]) [F14.10] 09/22/2014   History of Present Illness:   Howard Carlson is a 27 y/o M with history of schizoaffective disorder bipolar type and cocaine use disorder who was admitted on IVC from [facility] after he presented with worsening depression, SI with plan to jump from a bridge, and worsening use of crack cocaine. Pt was placed on IVC in ED due to ongoing SI and poor insight. He was transferred to [facility] for additional treatment and evaluation.  Upon initial presentation, pt shares, "I just wanted to get help. I had gone back to drugs." Pt cites stressor of conflict with his wife starting last week. Pt describes that he was unable to get inside their home to get his work uniform and his wife was not answering her phone, so he lost his job. He then took the TV from the home, which angered his wife and she kicked him out. Pt has been staying in a hotel prior to admission, and he relapsed on cocaine. He notes that he had been binging on crack cocaine for about 4 days prior to coming to ED. He developed SI with plan to jump from a bridge and he had gone to a bridge to attempt, but he stopped himself from jumping. Pt endorses depressive symptoms of depressed mood, anhedonia, guilty feelings, low energy, poor concentration, fluctuant appetite,  and psychomotor retardation. He denies symptoms of mania, OCD, and PTSD. He reports using cocaine in binge pattern for 4 days with being clean for 7 months prior. He denies other illicit substance use but his UDS was positive for THC.  Discussed with patient about treatment options. He has been tried on multiple medications in the past, but he cannot recall them. He was started on seroquel, remeron, and gabapentin in the ED, and he feels that they have been helpful so far, and he would like to continue on them. He is in agreement to increase dose of seroquel. He also agrees to trial of naltrexone to address cravings. Pt would like to seek substance use treatment at [facility], and he will discuss this option with the SW team.  Associated Signs/Symptoms: Depression Symptoms:  depressed mood, anhedonia, insomnia, psychomotor retardation, fatigue, feelings of worthlessness/guilt, difficulty concentrating, hopelessness, suicidal thoughts with specific plan, anxiety, disturbed sleep, (Hypo) Manic Symptoms:  Impulsivity, Irritable Mood, Anxiety Symptoms:  Excessive Worry, Psychotic Symptoms:  NA PTSD Symptoms: NA Total Time spent with patient: 1 hour  Past Psychiatric History:   -Hx of schizoaffective disorder, bipolar type and cocaine use disorder - Multiple inpatient stays with last at [facility] in Dec 2017 - No current outpatient provider - pt reports multiple suicide attempts with last attempt around age 79 via cutting  Is the patient at risk to self? Yes.    Has the patient been a risk to self in the past 6 months? Yes.    Has the patient been a risk to self within the distant past? Yes.  Is the patient a risk to others? Yes.    Has the patient been a risk to others in the past 6 months? Yes.    Has the patient been a risk to others within the distant past? Yes.     Prior Inpatient Therapy:   Prior Outpatient Therapy:    Alcohol Screening: 1. How often do you have a drink containing  alcohol?: Monthly or less 2. How many drinks containing alcohol do you have on a typical day when you are drinking?: 1 or 2 3. How often do you have six or more drinks on one occasion?: Never AUDIT-C Score: 1 4. How often during the last year have you found that you were not able to stop drinking once you had started?: Never 5. How often during the last year have you failed to do what was normally expected from you becasue of drinking?: Never 6. How often during the last year have you needed a first drink in the morning to get yourself going after a heavy drinking session?: Never 7. How often during the last year have you had a feeling of guilt of remorse after drinking?: Never 8. How often during the last year have you been unable to remember what happened the night before because you had been drinking?: Never 9. Have you or someone else been injured as a result of your drinking?: No 10. Has a relative or friend or a doctor or another health worker been concerned about your drinking or suggested you cut down?: No Alcohol Use Disorder Identification Test Final Score (AUDIT): 1 Intervention/Follow-up: AUDIT Score <7 follow-up not indicated Substance Abuse History in the last 12 months:  Yes.   Consequences of Substance Abuse: Medical Consequences:  worsened mood symptoms Legal Consequences:  currently on probation Family Consequences:  strained relationships Previous Psychotropic Medications: Yes  Psychological Evaluations: Yes  Past Medical History:  Past Medical History:  Diagnosis Date  . Anxiety   . Bipolar affective (HCC)   . Schizophrenic disorder (HCC)    History reviewed. No pertinent surgical history. Family History: History reviewed. No pertinent family history. Family Psychiatric  History: pt denies family psychiatric history and history of suicide attempts/completion Tobacco Screening: Have you used any form of tobacco in the last 30 days? (Cigarettes, Smokeless Tobacco,  Cigars, and/or Pipes): Yes Tobacco use, Select all that apply: 4 or less cigarettes per day Are you interested in Tobacco Cessation Medications?: No, patient refused Counseled patient on smoking cessation including recognizing danger situations, developing coping skills and basic information about quitting provided: Refused/Declined practical counseling Social History: Pt was born and raised in [place] and he has been living in the [place] area for about 4 years. He was living with his wife and their 4 children ages 83,6,2, and 1. He has been staying in a hotel for about the last week. 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 He has legal history of possession and parole violation and he was released from jail in November 2018. He denies trauma history. Social History   Substance and Sexual Activity  Alcohol Use No   Comment: Patient denies     Social History   Substance and Sexual Activity  Drug Use Yes  . Types: Marijuana, "Crack" cocaine, Cocaine   Comment: daily use recently    Additional Social History:                           Allergies:  Allergies  Allergen Reactions  . Amoxicillin Anaphylaxis and Other (See Comments)    Has patient had a PCN reaction causing immediate rash, facial/tongue/throat swelling, SOB or lightheadedness with hypotension: Yes Has patient had a PCN reaction causing severe rash involving mucus membranes or skin necrosis: No Has patient had a PCN reaction that required hospitalization No Has patient had a PCN reaction occurring within the last 10 years: No If all of the above answers are "NO", then may proceed with Cephalosporin use.    Lab Results: No results found for this or any previous visit (from the past 48 hour(s)).  Blood Alcohol level:  Lab Results  Component Value Date   ETH <10 05/08/2017   ETH <5 02/07/2016    Metabolic Disorder Labs:  Lab Results  Component Value Date   HGBA1C 5.6 01/15/2016   MPG 114 01/15/2016    Lab Results  Component Value Date   PROLACTIN 33.1 (H) 01/15/2016   Lab Results  Component Value Date   CHOL 143 01/15/2016   TRIG 91 01/15/2016   HDL 47 01/15/2016   CHOLHDL 3.0 01/15/2016   VLDL 18 01/15/2016   LDLCALC 78 01/15/2016    Current Medications: Current Facility-Administered Medications  Medication Dose Route Frequency Provider Last Rate Last Dose  . acetaminophen (TYLENOL) tablet 650 mg  650 mg Oral Q6H PRN Laveda Abbe, NP      . alum & mag hydroxide-simeth (MAALOX/MYLANTA) 200-200-20 MG/5ML suspension 30 mL  30 mL Oral Q4H PRN Laveda Abbe, NP      . gabapentin (NEURONTIN) capsule 300 mg  300 mg Oral BID Laveda Abbe, NP   300 mg at 05/11/17 0820  . hydrOXYzine (ATARAX/VISTARIL) tablet 25 mg  25 mg Oral Q6H PRN Laveda Abbe, NP      . magnesium hydroxide (MILK OF MAGNESIA) suspension 30 mL  30 mL Oral Daily PRN Laveda Abbe, NP      . mirtazapine (REMERON) tablet 15 mg  15 mg Oral QHS Laveda Abbe, NP   15 mg at 05/10/17 2141  . naltrexone (DEPADE) tablet 50 mg  50 mg Oral Daily Micheal Likens, MD      . QUEtiapine (SEROQUEL) tablet 150 mg  150 mg Oral QHS Micheal Likens, MD       PTA Medications: Medications Prior to Admission  Medication Sig Dispense Refill Last Dose  . hydrOXYzine (ATARAX/VISTARIL) 25 MG tablet Take 1 tablet (25 mg total) by mouth every 6 (six) hours as needed for anxiety. (Patient not taking: Reported on 05/08/2017) 30 tablet 0 Not Taking at Unknown time  . mirtazapine (REMERON) 15 MG tablet Take 1 tablet (15 mg total) by mouth at bedtime. (Patient not taking: Reported on 05/08/2017) 30 tablet 0 Not Taking at Unknown time  . nicotine polacrilex (NICORETTE) 2 MG gum Take 1 each (2 mg total) by mouth as needed for smoking cessation. (Patient not taking: Reported on 05/08/2017) 100 tablet 0 Not Taking at Unknown time  . QUEtiapine (SEROQUEL) 100 MG tablet Take 1 tablet (100 mg  total) by mouth at bedtime. (Patient not taking: Reported on 05/08/2017) 30 tablet 0 Not Taking at Unknown time  . venlafaxine XR (EFFEXOR-XR) 75 MG 24 hr capsule Take 1 capsule (75 mg total) by mouth daily with breakfast. (Patient not taking: Reported on 05/08/2017) 30 capsule 0 Not Taking at Unknown time    Musculoskeletal: Strength & Muscle Tone: within normal limits Gait & Station: normal Patient leans: N/A  Psychiatric Specialty Exam: Physical Exam  Nursing note and vitals reviewed.   Review of Systems  Constitutional: Negative for chills and fever.  Respiratory: Negative for cough and shortness of breath.   Cardiovascular: Negative for chest pain.  Gastrointestinal: Negative for abdominal pain, heartburn, nausea and vomiting.  Psychiatric/Behavioral: Positive for depression, substance abuse and suicidal ideas. Negative for hallucinations. The patient has insomnia. The patient is not nervous/anxious.     Blood pressure 125/81, pulse 79, temperature 100.1 F (37.8 C), temperature source Oral, resp. rate 18, height 5\' 8"  (1.727 m), weight 85.7 kg (189 lb).Body mass index is 28.74 kg/m.  General Appearance: Casual  Eye Contact:  Fair  Speech:  Clear and Coherent and Normal Rate  Volume:  Normal  Mood:  Depressed  Affect:  Congruent and Constricted  Thought Process:  Coherent and Goal Directed  Orientation:  Full (Time, Place, and Person)  Thought Content:  Logical  Suicidal Thoughts:  Yes.  with intent/plan  Homicidal Thoughts:  No  Memory:  Immediate;   Fair Recent;   Fair Remote;   Fair  Judgement:  Poor  Insight:  Lacking  Psychomotor Activity:  Normal  Concentration:  Concentration: Fair  Recall:  [employer]  Fund of Knowledge:  Fair  Language:  Fair  Akathisia:  No  Handed:    AIMS (if indicated):     Assets:  Communication Skills Resilience Social Support  ADL's:  Intact  Cognition:  WNL  Sleep:  Number of Hours: 6.75    Treatment Plan Summary: Daily contact  with patient to assess and evaluate symptoms and progress in treatment and Medication management  Observation Level/Precautions:  15 minute checks  Laboratory:  CBC Chemistry Profile HbAIC UDS UA  Psychotherapy:  Encourage participation in groups and therapeutic milieu   Medications:  Continue gabapentin 300mg  po BID. Continue remeron 15mg  po qhs. Increase seroquel 100mg  qhs to seroquel 150mg  qhs. Start naltrexone 50mg  po qDay. Continue vistaril 25mg  po q6h prn anxiety.  Consultations:    Discharge Concerns:    Estimated LOS: 3-5 days  Other:     Physician Treatment Plan for Primary Diagnosis: Schizoaffective disorder, bipolar type (HCC) Long Term Goal(s): Improvement in symptoms so as ready for discharge  Short Term Goals: Ability to identify and develop effective coping behaviors will improve  Physician Treatment Plan for Secondary Diagnosis: Principal Problem:   Schizoaffective disorder, bipolar type (HCC) Active Problems:   Cocaine use disorder, mild, abuse (HCC)  Long Term Goal(s): Improvement in symptoms so as ready for discharge  Short Term Goals: Ability to identify triggers associated with substance abuse/mental health issues will improve  I certify that inpatient services furnished can reasonably be expected to improve the patient's condition.    Micheal Likenshristopher T Abayomi Pattison, MD 4/18/201911:31 AM

## 2017-05-11 NOTE — Progress Notes (Signed)
Nursing Progress Note: 7p-7a D: Pt currently presents with a labile/pleasant/sad/depressed/ambivalence affect and behavior. Pt states "I just want to fight someone right now. I feel good right now. I laugh at the face of danger. I feel sad about my wife cheating on me. I don't want to think of that right now. She made me a better man. Now, how am I supposed to live?" Interacting intrusively/loudly with the milieu. Pt reports good sleep during the previous night with current medication regimen.   A: Pt provided with medications per providers orders. Pt's labs and vitals were monitored throughout the night. Pt supported emotionally and encouraged to express concerns and questions. Pt educated on medications.  R: Pt's safety ensured with 15 minute and environmental checks. Pt currently denies SI, HI, and AVH. Pt verbally contracts to seek staff if SI,HI, or AVH occurs and to consult with staff before acting on any harmful thoughts. Will continue to monitor.

## 2017-05-11 NOTE — Progress Notes (Signed)
Patient denies SI, HI and AVH.  Patient has had no incidents of behavioral dsycontrol.  Patient has attended groups and engaged in unit activities throughout the shift.    Assess patient for safety, offer medications as prescribed, engage patient in 1:1 therapeutic staff talks, monitor for effectiveness of medications.   Patient able to contract for safety, continue to monitor as planned.    

## 2017-05-11 NOTE — [facility] Suicide Risk Assessment (Signed)
[facility] INPATIENT:  Family/Significant Other Suicide Prevention Education  Suicide Prevention Education:  Education Completed; with patient's father, Howard Carlson [phone]) has been identified by the patient as the family member/significant other with whom the patient will be residing, and identified as the person(s) who will aid the patient in the event of a mental health crisis (suicidal ideations/suicide attempt).  With written consent from the patient, the family member/significant other has been provided the following suicide prevention education, prior to the and/or following the discharge of the patient.  The suicide prevention education provided includes the following:  Suicide risk factors  Suicide prevention and interventions  National Suicide Hotline telephone number  [facility] assessment telephone number  [facility] Assistance 911  [facility] and/or Residential Mobile Crisis Unit telephone number  Request made of family/significant other to:  Remove weapons (e.g., guns, rifles, knives), all items previously/currently identified as safety concern.    Remove drugs/medications (over-the-counter, prescriptions, illicit drugs), all items previously/currently identified as a safety concern.  The family member/significant other verbalizes understanding of the suicide prevention education information provided.  The family member/significant other agrees to remove the items of safety concern listed above.  Patient's father, Howard Carlson, states there are no guns or weapons in the home due to having small children in the home.   Howard Carlson states that the patient was doing "pretty well" after being in jail for 4 months last year but he was drinking heavily. Shortly afterward, he started using crack cocaine daily and acting erratically. Howard Carlson shares that the patient has only maintained sobriety while incarcerated and would likely benefit from long term residential  treatment.  The patient's suicide attempt has frightened his family and especially his wife. According to Howard Carlson, the patient's wife Howard Carlson, is not comfortable with the patient returning home in the near future as she feels that he may attempt suicide again or he may attempt to harm her.  The patient has become increasingly paranoid over the last couple months and is especially concerned about his wife being unfaithful to him. According to Howard Carlson, the patient's wife is always home, is extremely supportive, and has never given the patient a reason to believe she has been unfaithful.   Howard Carlson spoke to the patient early today and states the patient is still "delusional" but appears to demonstrate some insight as the patient states "my mind is playing tricks on me."  Howard Carlson 05/11/2017, 2:57 PM

## 2017-05-11 NOTE — Progress Notes (Signed)
Recreation Therapy Notes  Date: 4.18.19.  Time: 10:00 a.m. Location: [address] Dayroom  Group Topic: Wellness   Goal Area(s) Addresses:  To promote physical health  -Patient will participate in recreation Therapy group tx. -Patient will identify the importance of physical     Intervention: Exercise   Activity: Bowling: Patients had the opportunity to play ten frames of bowling with peers. The patient who had the most points at the end won.   Education: Wellness  Education Outcome: Acknowledges Education  Clinical Observations/Feedback: Patient did not attend   Sheryle HailDarian Louis Ivery, Recreation Therapy Intern   Sheryle HailDarian Hameed Kolar 05/11/2017 11:33 AM

## 2017-05-11 NOTE — Tx Team (Signed)
Interdisciplinary Treatment and Diagnostic Plan Update  05/11/2017 Time of Session: 1:27 PM  Howard Carlson MRN: [id number]  Principal Diagnosis: Schizoaffective disorder, bipolar type (HCC)  Secondary Diagnoses: Principal Problem:   Schizoaffective disorder, bipolar type (HCC) Active Problems:   Cocaine use disorder, mild, abuse (HCC)   Current Medications:  Current Facility-Administered Medications  Medication Dose Route Frequency Provider Last Rate Last Dose  . acetaminophen (TYLENOL) tablet 650 mg  650 mg Oral Q6H PRN Laveda Abbe, NP      . alum & mag hydroxide-simeth (MAALOX/MYLANTA) 200-200-20 MG/5ML suspension 30 mL  30 mL Oral Q4H PRN Laveda Abbe, NP      . gabapentin (NEURONTIN) capsule 300 mg  300 mg Oral BID Laveda Abbe, NP   300 mg at 05/11/17 0820  . hydrOXYzine (ATARAX/VISTARIL) tablet 25 mg  25 mg Oral Q6H PRN Laveda Abbe, NP      . magnesium hydroxide (MILK OF MAGNESIA) suspension 30 mL  30 mL Oral Daily PRN Laveda Abbe, NP      . mirtazapine (REMERON) tablet 15 mg  15 mg Oral QHS Laveda Abbe, NP   15 mg at 05/10/17 2141  . naltrexone (DEPADE) tablet 50 mg  50 mg Oral Daily Micheal Likens, MD      . QUEtiapine (SEROQUEL) tablet 150 mg  150 mg Oral QHS Micheal Likens, MD        PTA Medications: Medications Prior to Admission  Medication Sig Dispense Refill Last Dose  . hydrOXYzine (ATARAX/VISTARIL) 25 MG tablet Take 1 tablet (25 mg total) by mouth every 6 (six) hours as needed for anxiety. (Patient not taking: Reported on 05/08/2017) 30 tablet 0 Not Taking at Unknown time  . mirtazapine (REMERON) 15 MG tablet Take 1 tablet (15 mg total) by mouth at bedtime. (Patient not taking: Reported on 05/08/2017) 30 tablet 0 Not Taking at Unknown time  . nicotine polacrilex (NICORETTE) 2 MG gum Take 1 each (2 mg total) by mouth as needed for smoking cessation. (Patient not taking: Reported on  05/08/2017) 100 tablet 0 Not Taking at Unknown time  . QUEtiapine (SEROQUEL) 100 MG tablet Take 1 tablet (100 mg total) by mouth at bedtime. (Patient not taking: Reported on 05/08/2017) 30 tablet 0 Not Taking at Unknown time  . venlafaxine XR (EFFEXOR-XR) 75 MG 24 hr capsule Take 1 capsule (75 mg total) by mouth daily with breakfast. (Patient not taking: Reported on 05/08/2017) 30 capsule 0 Not Taking at Unknown time    Treatment Modalities: Medication Management, Group therapy, Case management,  1 to 1 session with clinician, Psychoeducation, Recreational therapy.  Patient Stressors: Marital or family conflict Medication change or noncompliance Substance abuse  Patient Strengths: Ability for insight Average or above average intelligence Capable of independent living General fund of knowledge Motivation for treatment/growth   Physician Treatment Plan for Primary Diagnosis: Schizoaffective disorder, bipolar type (HCC) Long Term Goal(s): Improvement in symptoms so as ready for discharge  Short Term Goals: Ability to identify and develop effective coping behaviors will improve Ability to identify triggers associated with substance abuse/mental health issues will improve  Medication Management: Evaluate patient's response, side effects, and tolerance of medication regimen.  Therapeutic Interventions: 1 to 1 sessions, Unit Group sessions and Medication administration.  Evaluation of Outcomes: Progressing  Physician Treatment Plan for Secondary Diagnosis: Principal Problem:   Schizoaffective disorder, bipolar type (HCC) Active Problems:   Cocaine use disorder, mild, abuse (HCC)  Long Term Goal(s): Improvement in symptoms  so as ready for discharge  Short Term Goals: Ability to identify and develop effective coping behaviors will improve Ability to identify triggers associated with substance abuse/mental health issues will improve  Medication Management: Evaluate patient's response,  side effects, and tolerance of medication regimen.  Therapeutic Interventions: 1 to 1 sessions, Unit Group sessions and Medication administration.  Evaluation of Outcomes: Progressing   RN Treatment Plan for Primary Diagnosis: Schizoaffective disorder, bipolar type (HCC) Long Term Goal(s): Knowledge of disease and therapeutic regimen to maintain health will improve  Short Term Goals: Ability to remain free from injury will improve, Ability to participate in decision making will improve, Ability to disclose and discuss suicidal ideas, Ability to identify and develop effective coping behaviors will improve and Compliance with prescribed medications will improve  Medication Management: RN will administer medications as ordered by provider, will assess and evaluate patient's response and provide education to patient for prescribed medication. RN will report any adverse and/or side effects to prescribing provider.  Therapeutic Interventions: 1 on 1 counseling sessions, Psychoeducation, Medication administration, Evaluate responses to treatment, Monitor vital signs and CBGs as ordered, Perform/monitor CIWA, COWS, AIMS and Fall Risk screenings as ordered, Perform wound care treatments as ordered.  Evaluation of Outcomes: Progressing   LCSW Treatment Plan for Primary Diagnosis: Schizoaffective disorder, bipolar type (HCC) Long Term Goal(s): Safe transition to appropriate next level of care at discharge, Engage patient in therapeutic group addressing interpersonal concerns.  Short Term Goals: Engage patient in aftercare planning with referrals and resources, Facilitate acceptance of mental health diagnosis and concerns, Identify triggers associated with mental health/substance abuse issues and Increase skills for wellness and recovery  Therapeutic Interventions: Assess for all discharge needs, 1 to 1 time with Social worker, Explore available resources and support systems, Assess for adequacy in  community support network, Educate family and significant other(s) on suicide prevention, Complete Psychosocial Assessment, Interpersonal group therapy.  Evaluation of Outcomes: Progressing   Progress in Treatment: Attending groups: Yes Participating in groups: Yes Taking medication as prescribed: Yes Toleration of medication: Yes, no side effects reported at this time Family/Significant other contact made: yes, Jeani SowKevin Hagins [phone] Patient understands diagnosis: Yes AEB, asking for substance use treatment  Discussing patient identified problems/goals with staff: Yes Medical problems stabilized or resolved: Yes Denies suicidal/homicidal ideation: Yes Issues/concerns per patient self-inventory: None Other: N/A  New problem(s) identified: None identified at this time.   New Short Term/Long Term Goal(s): "I want to kick this habit and get clean".   Discharge Plan or Barriers:  Pt is interested in shelters and would [facility].   Reason for Continuation of Hospitalization:  Depression Medication stabilization Suicidal ideation   Estimated Length of Stay:  05/16/17  Attendees: Patient:  Howard Carlson 05/11/2017  1:27 PM  Physician: Jolyne Loahristopher Rainville, MD 05/11/2017  1:27 PM  Nursing: Estella HuskElizabeth Awofabeju, RN 05/11/2017  1:27 PM  RN Care Manager: Onnie BoerJennifer Clark, RN 05/11/2017  1:27 PM  Social Worker: Richelle Itood North, LCSW; Melba CoonAngel Aireonna Bauer, Social Work Intern 05/11/2017  1:27 PM  Recreational Therapist: Caroll RancherMarjette Lindsay, LRT 05/11/2017  1:27 PM  Other: Tomasita Morrowelora Sutton, P4CC 05/11/2017  1:27 PM  Other:  05/11/2017  1:27 PM  Other: 05/11/2017  1:27 PM    Scribe for Treatment Team: Aram BeechamAngel M Clinton Dragone, Student-Social Work 05/11/2017 1:27 PM

## 2017-05-11 NOTE — [facility] Suicide Risk Assessment (Signed)
[facility]   Nursing information obtained from:    Demographic factors:    Current Mental Status:    Loss Factors:    Historical Factors:    Risk Reduction Factors:     Total Time spent with patient: 1 hour Principal Problem: Schizoaffective disorder, bipolar type (HCC) Diagnosis:   Patient Active Problem List   Diagnosis Date Noted  . Cocaine abuse with cocaine-induced mood disorder (HCC) [F14.14] 05/09/2017  . Cocaine use disorder, mild, abuse (HCC) [F14.10] 01/20/2016  . Cannabis use disorder, mild, abuse [F12.10] 01/20/2016  . Chlamydia infection [A74.9] 01/20/2016  . Schizoaffective disorder, bipolar type (HCC) [F25.0] 01/13/2016  . Cocaine abuse (HCC) [F14.10] 09/22/2014   Subjective Data: See H&P for details  Continued Clinical Symptoms:  Alcohol Use Disorder Identification Test Final Score (AUDIT): 1 The "Alcohol Use Disorders Identification Test", Guidelines for Use in Primary Care, Second Edition.  World Science writerHealth Organization [facility](WHO). Score between 0-7:  no or low risk or alcohol related problems. Score between 8-15:  moderate risk of alcohol related problems. Score between 16-19:  high risk of alcohol related problems. Score 20 or above:  warrants further diagnostic evaluation for alcohol dependence and treatment.   CLINICAL FACTORS:   Severe Anxiety and/or Agitation Bipolar Disorder:   Depressive phase Alcohol/Substance Abuse/Dependencies Schizophrenia:   Depressive state More than one psychiatric diagnosis Unstable or Poor Therapeutic Relationship Previous Psychiatric Diagnoses and Treatments  Psychiatric Specialty Exam: Physical Exam  Nursing note and vitals reviewed.   ROS- See H&P for details  Blood pressure 125/81, pulse 79, temperature 100.1 F (37.8 C), temperature source Oral, resp. rate 18, height 5\' 8"  (1.727 m), weight 85.7 kg (189 lb).Body mass index is 28.74 kg/m.    COGNITIVE FEATURES THAT CONTRIBUTE TO RISK:   Thought constriction (tunnel vision)    SUICIDE RISK:   Moderate:  Frequent suicidal ideation with limited intensity, and duration, some specificity in terms of plans, no associated intent, good self-control, limited dysphoria/symptomatology, some risk factors present, and identifiable protective factors, including available and accessible social support.  PLAN OF CARE: See H&P for details  I certify that inpatient services furnished can reasonably be expected to improve the patient's condition.   Micheal Likenshristopher T Rashonda Warrior, MD 05/11/2017, 11:43 AM

## 2017-05-12 MED ORDER — LORAZEPAM 1 MG PO TABS
1.0000 mg | ORAL_TABLET | Freq: Four times a day (QID) | ORAL | Status: DC | PRN
  Administered 2017-05-14: 1 mg via ORAL
  Filled 2017-05-12: qty 1

## 2017-05-12 MED ORDER — LORAZEPAM 2 MG/ML IJ SOLN: 1.0000 mg | Freq: Four times a day (QID) | INTRAMUSCULAR | Status: DC | PRN

## 2017-05-12 MED ORDER — HALOPERIDOL LACTATE 5 MG/ML IJ SOLN: 5.0000 mg | Freq: Four times a day (QID) | INTRAMUSCULAR | Status: DC | PRN

## 2017-05-12 MED ORDER — HALOPERIDOL 5 MG PO TABS
5.0000 mg | ORAL_TABLET | Freq: Four times a day (QID) | ORAL | Status: DC | PRN
  Administered 2017-05-14: 5 mg via ORAL
  Filled 2017-05-12: qty 1

## 2017-05-12 NOTE — Progress Notes (Signed)
Patient denies SI, HI and AVH.  Patient has had no incidents of behavioral dsycontrol.  Patient has attended groups and engaged in unit activities throughout the shift.    Assess patient for safety, offer medications as prescribed, engage patient in 1:1 therapeutic staff talks, monitor for effectiveness of medications.   Patient able to contract for safety, continue to monitor as planned.

## 2017-05-12 NOTE — Progress Notes (Signed)
Writer has observed patient up in the dayroom talking loudly and joking around with peers. He came to nursing station and asked what he needed to do to get a shot. Writer asked why he felt that he needed a shot and he reports that he just wants one. Writer offered vistaril for feeling anxious and he accepted. When asked what he was working on or his goal while here, at first he said he didn't know but later said that he wanted to get better for himself. He returned to the dayroom where he laughs and jokes with peers. Support given and safety maintained on unit with 15 min checks.

## 2017-05-12 NOTE — Progress Notes (Signed)
[facility]  05/12/2017 7:28 PM Howard PhillipsDeontae Mctague  MRN:  [id number] Subjective:     Howard Carlson is a 27 y/o M with history of schizoaffective disorder bipolar type and cocaine use disorder who was admitted on IVC from [facility] after he presented with worsening depression, SI with plan to jump from a bridge, and worsening use of crack cocaine. Pt was placed on IVC in ED due to ongoing SI and poor insight. [facility] and [facility]. He was started on gabapentin, seroquel, and remeron in the ED, and continued on those medications at time of admission. Dose of seroquel was increased slightly. He was also started on trial of naltrexone for cravings. Pt expressed interest in [facility].  Upon evaluation today, pt shares, "I'm doing so-so. I just got my family on my mind." Pt is hesitant to share more, but with encouragement he reveals that he is doubtful about attending [facility] for substance use treatment. He shares, "[facility], and I brought myself in here for me. Now my old lady doesn't want to talk to me, I think I just want to go to [place] and stay with my pops and work for him." Pt denies SI/HI/AH/VH. He reports he is tolerating his medications well. He has been talking to another patient he knows from outside the hospital, and pt indicates he was thinking about arranging for a ride with this peer to [place]. Pt was encouraged to consider treatment for severe substance use, and we will continue his admission until at least tomorrow. Pt states he will consider option of [facility], and if he decides against treatment, he will be appropriate for discharge tomorrow.  Principal Problem: Schizoaffective disorder, bipolar type (HCC) Diagnosis:   Patient Active Problem List   Diagnosis Date Noted  . Cocaine abuse with cocaine-induced mood disorder (HCC) [F14.14] 05/09/2017  . Cocaine use  disorder, mild, abuse (HCC) [F14.10] 01/20/2016  . Cannabis use disorder, mild, abuse [F12.10] 01/20/2016  . Chlamydia infection [A74.9] 01/20/2016  . Schizoaffective disorder, bipolar type (HCC) [F25.0] 01/13/2016  . Cocaine abuse (HCC) [F14.10] 09/22/2014   Total Time spent with patient: 30 minutes  Past Psychiatric History: see H&P  Past Medical History:  Past Medical History:  Diagnosis Date  . Anxiety   . Bipolar affective (HCC)   . Schizophrenic disorder (HCC)    History reviewed. No pertinent surgical history. Family History: History reviewed. No pertinent family history. Family Psychiatric  History: see H&P Social History:  Social History   Substance and Sexual Activity  Alcohol Use No   Comment: Patient denies     Social History   Substance and Sexual Activity  Drug Use Yes  . Types: Marijuana, "Crack" cocaine, Cocaine   Comment: daily use recently    Social History   Socioeconomic History  . Marital status: Married    Spouse name: Not on file  . Number of children: Not on file  . Years of education: Not on file  . Highest education level: Not on file  Occupational History  . Not on file  Social Needs  . Financial resource strain: Not on file  . Food insecurity:    Worry: Not on file    Inability: Not on file  . Transportation needs:    Medical: Not on file    Non-medical: Not on file  Tobacco Use  . Smoking status: Current Every Day Smoker  Types: Cigarettes  . Smokeless tobacco: Never Used  Substance and Sexual Activity  . Alcohol use: No    Comment: Patient denies  . Drug use: Yes    Types: Marijuana, "Crack" cocaine, Cocaine    Comment: daily use recently  . Sexual activity: Yes  Lifestyle  . Physical activity:    Days per week: Not on file    Minutes per session: Not on file  . Stress: Not on file  Relationships  . Social connections:    Talks on phone: Not on file    Gets together: Not on file    Attends religious service: Not on  file    Active member of club or organization: Not on file    Attends meetings of clubs or organizations: Not on file    Relationship status: Not on file  Other Topics Concern  . Not on file  Social History Narrative  . Not on file   Additional Social History:                         Sleep: Good  Appetite:  Good  Current Medications: Current Facility-Administered Medications  Medication Dose Route Frequency Provider Last Rate Last Dose  . acetaminophen (TYLENOL) tablet 650 mg  650 mg Oral Q6H PRN Laveda Abbe, NP      . alum & mag hydroxide-simeth (MAALOX/MYLANTA) 200-200-20 MG/5ML suspension 30 mL  30 mL Oral Q4H PRN Laveda Abbe, NP      . gabapentin (NEURONTIN) capsule 300 mg  300 mg Oral BID Laveda Abbe, NP   300 mg at 05/12/17 1719  . haloperidol (HALDOL) tablet 5 mg  5 mg Oral Q6H PRN Micheal Likens, MD       Or  . haloperidol lactate (HALDOL) injection 5 mg  5 mg Intramuscular Q6H PRN Micheal Likens, MD      . hydrOXYzine (ATARAX/VISTARIL) tablet 25 mg  25 mg Oral Q6H PRN Laveda Abbe, NP      . LORazepam (ATIVAN) tablet 1 mg  1 mg Oral Q6H PRN Micheal Likens, MD       Or  . LORazepam (ATIVAN) injection 1 mg  1 mg Intramuscular Q6H PRN Micheal Likens, MD      . magnesium hydroxide (MILK OF MAGNESIA) suspension 30 mL  30 mL Oral Daily PRN Laveda Abbe, NP      . mirtazapine (REMERON) tablet 15 mg  15 mg Oral QHS Laveda Abbe, NP   15 mg at 05/11/17 2128  . naltrexone (DEPADE) tablet 50 mg  50 mg Oral Daily Micheal Likens, MD   50 mg at 05/12/17 0809  . QUEtiapine (SEROQUEL) tablet 150 mg  150 mg Oral QHS Micheal Likens, MD   150 mg at 05/11/17 2128    Lab Results: No results found for this or any previous visit (from the past 48 hour(s)).  Blood Alcohol level:  Lab Results  Component Value Date   ETH <10 05/08/2017   ETH <5 02/07/2016     Metabolic Disorder Labs: Lab Results  Component Value Date   HGBA1C 5.6 01/15/2016   MPG 114 01/15/2016   Lab Results  Component Value Date   PROLACTIN 33.1 (H) 01/15/2016   Lab Results  Component Value Date   CHOL 143 01/15/2016   TRIG 91 01/15/2016   HDL 47 01/15/2016   CHOLHDL 3.0 01/15/2016   VLDL 18 01/15/2016  LDLCALC 78 01/15/2016    Physical Findings: AIMS: Facial and Oral Movements Muscles of Facial Expression: None, normal Lips and Perioral Area: None, normal Jaw: None, normal Tongue: None, normal,Extremity Movements Upper (arms, wrists, hands, fingers): None, normal Lower (legs, knees, ankles, toes): None, normal, Trunk Movements Neck, shoulders, hips: None, normal, Overall Severity Severity of abnormal movements (highest score from questions above): None, normal Incapacitation due to abnormal movements: None, normal Patient's awareness of abnormal movements (rate only patient's report): No Awareness, Dental Status Current problems with teeth and/or dentures?: No Does patient usually wear dentures?: No  CIWA:    COWS:     Musculoskeletal: Strength & Muscle Tone: within normal limits Gait & Station: normal Patient leans: N/A  Psychiatric Specialty Exam: Physical Exam  Nursing note and vitals reviewed.   Review of Systems  Constitutional: Negative for chills and fever.  Respiratory: Negative for cough and shortness of breath.   Cardiovascular: Negative for chest pain.  Gastrointestinal: Negative for abdominal pain, heartburn, nausea and vomiting.  Psychiatric/Behavioral: Negative for depression, hallucinations and suicidal ideas. The patient is nervous/anxious. The patient does not have insomnia.     Blood pressure 125/81, pulse 79, temperature 100.1 F (37.8 C), temperature source Oral, resp. rate 18, height 5\' 8"  (1.727 m), weight 85.7 kg (189 lb).Body mass index is 28.74 kg/m.  General Appearance: Casual  Eye Contact:  Good  Speech:  Clear  and Coherent and Normal Rate  Volume:  Normal  Mood:  Anxious and Irritable  Affect:  Appropriate and Congruent  Thought Process:  Coherent  Orientation:  Full (Time, Place, and Person)  Thought Content:  Logical  Suicidal Thoughts:  No  Homicidal Thoughts:  No  Memory:  Immediate;   Fair Recent;   Fair Remote;   Fair  Judgement:  Poor  Insight:  Lacking  Psychomotor Activity:  Normal  Concentration:  Concentration: Fair  Recall:  [employer] of Knowledge:  Fair  Language:  Fair  Akathisia:  No  Handed:    AIMS (if indicated):     Assets:  Resilience  ADL's:  Intact  Cognition:  WNL  Sleep:  Number of Hours: 6.75   Treatment Plan Summary: Daily contact with patient to assess and evaluate symptoms and progress in treatment and Medication management   -Continue inpatient hospitalization  -Schizoaffective disorder, bipolar type   - Continue remeron 15mg  po qhs   - Continue Seroquel 150mg  po qhs  -Cocaine use disorder   -Continue naltrexone 50mg  po qDay for cravings  -Anxiety  -Continue gabapentin 300mg  po BID  - Continue vistaril 25mg  po q6h prn anxiety  -Agitation  -Continue haldol 5mg  po/IM q6h prn agitation   - Continue ativan 1mg  po/IM q6h prn agitation  -Encourage participation in groups and therapeutic milieu  -Disposition planning will be ongoing   Micheal Likens, MD 05/12/2017, 7:28 PM

## 2017-05-12 NOTE — Progress Notes (Signed)
Patient ID: Howard PhillipsDeontae Corman, male   DOB: April 16, 1990, 27 y.o.   MRN: [id number] Patient discharged to home/self care.  Patient acknowledged understanding of all discharge instructions and receipt of all personal belongings. Patient denies SI, H and AVH upon discharge.

## 2017-05-12 NOTE — [facility] Group Notes (Signed)
[facility] LCSW Group Therapy 05/12/2017 1:15pm  Type of Therapy: Group Therapy- Feelings Around Discharge & Establishing a Supportive Framework  Participation Level:  Active  Description of Group:   What is a supportive framework? What does it look like feel like and how do I discern it from and unhealthy non-supportive network? Learn how to cope when supports are not helpful and don't support you. Discuss what to do when your family/friends are not supportive.  Summary of Patient Progress  Howard Carlson came to group but he came in late.  For him hope is believing in a different outcome, even if you can't see it. He keeps hope by standing for what he believes in and not letting the environment he is in now change his path and his future.  He often carries a rock around with him that he gave to his life to remind him to have hope and keep moving forward.  When asked to choose a picture he left the room.    Therapeutic Modalities:   Cognitive Behavioral Therapy Person-Centered Therapy Motivational Interviewing   [name], Student-Social Work 05/12/2017 1:43 PM

## 2017-05-12 NOTE — Progress Notes (Signed)
Recreation Therapy Notes  Date: 4.19.19 Time: 10:00 a.m. Location: [address] Dayroom   Group Topic: Stress Management   Goal Area(s) Addresses:  Goal 1.1: To reduce stress  -Patient will feel a reduction in stress level  -Patient will understand the importance of stress management  -Patient will participate during stress management group   Intervention: Stress Management   Activity: Meditation: Patients were in a peaceful environment with soft lighting enhancing patients mood. Patients listened to a body scan meditation from the calm app to help release tension and stress.   Education: Stress Management, Discharge Planning.    Education Outcome: Acknowledges edcuation/In group clarification offered/Needs additional education   Clinical Observations/Feedback:: Patient did not attend    Sheryle HailDarian Nadezhda Pollitt, Recreation Therapy Intern   Sheryle HailDarian Sekai Nayak 05/12/2017 11:24 AM

## 2017-05-13 DIAGNOSIS — F1414 Cocaine abuse with cocaine-induced mood disorder: Secondary | ICD-10-CM

## 2017-05-13 NOTE — Plan of Care (Signed)
D: Pt  Passive AVH- pt stated they are low. Pt presents on the unit childlike/ immature .  Pt visible on unit interacting appropriately, pt stated he was doing better since coming in.  A: Pt was offered support and encouragement. Pt was given scheduled medications. Pt was encourage to attend groups. Q 15 minute checks were done for safety.  R:Pt attends groups and interacts well with peers and staff. Pt is taking medication. Pt has no complaints.Pt receptive to treatment and safety maintained on unit.    Problem: Coping: Goal: Level of anxiety will decrease Outcome: Progressing   Problem: Safety: Goal: Ability to remain free from injury will improve Outcome: Progressing   Problem: Education: Goal: Mental status will improve Outcome: Progressing   Problem: Activity: Goal: Sleeping patterns will improve Outcome: Progressing

## 2017-05-13 NOTE — [facility] Group Notes (Signed)
  [facility] LCSW Group Therapy Note  Date/Time:  05/13/2017 11:15AM-12:00PM  Type of Therapy and Topic:  Group Therapy:  Feelings About Hospitalization  Participation Level:  Did Not Attend   Description of Group This process group involved patients discussing their feelings related to being hospitalized, as well as the benefits they see to being in the hospital.  These feelings and benefits were itemized.  The group then brainstormed specific ways in which they could seek those same benefits when they discharge and return home.  Therapeutic Goals 1. Patient will identify and describe positive and negative feelings related to hospitalization 2. Patient will verbalize benefits of hospitalization to themselves personally 3. Patients will brainstorm together ways they can obtain similar benefits in the outpatient setting, identify barriers to wellness and possible solutions  Summary of Patient Progress:  N/A  Therapeutic Modalities Cognitive Behavioral Therapy Motivational Interviewing    Ambrose MantleMareida Grossman-Orr, LCSW 05/13/2017, 9:00 AM

## 2017-05-13 NOTE — [facility] Group Notes (Signed)
[facility] Group Notes:  (Nursing/MHT/Case Management/Adjunct)  Date:  05/13/2017  Time:  4:48 PM  Type of Therapy:  Nurse Education  Participation Level:  Active  Participation Quality:  Appropriate  Affect:  Appropriate  Cognitive:  Appropriate  Insight:  Appropriate  Engagement in Group:  Engaged  Modes of Intervention:  Education  Summary of Progress/Problems: Pt listened to material presented on anger management.   Helayna Dun M 05/13/2017, 4:48 PM   

## 2017-05-13 NOTE — Progress Notes (Signed)
ECG NSR with sinus arrhythmia. QT 378/QTC 396.

## 2017-05-13 NOTE — Progress Notes (Addendum)
[facility] MD Progress Note  05/13/2017 11:15 AM [name]  MRN:  [id number]   Subjective:  Howard Carlson seen resting in bed.  Patient is awake alert and oriented x3.  Presents flat and guarded during this assessment.  Reports on here to detox.  Reports I would consider going to [facility] on tuesday.  Denies suicidal or homicidal ideations.  Denies self-harm or intent.  Reports his depression is a 4 out of 10.  Continues to deny cravings.  Reports medication is helping.  Denies medication side effects reports he has a good appetite and is resting well throughout the night.  Support and encouragement and reassurance was provided  History: Howard Carlson is a 27 y/o M with history of schizoaffective disorder bipolar type and cocaine use disorder who was admitted on IVC from [facility] after he presented with worsening depression, SI with plan to jump from a bridge, and worsening use of crack cocaine. Pt was placed on IVC in ED due to ongoing SI and poor insight. He was transferred to [facility] for additional treatment and evaluation. He was started on gabapentin, Seroquel, and Remeron in the ED, and continued on those medications at time of admission. Dose of Seroquel was increased slightly. He was also started on trial of naltrexone for cravings. Pt expressed interest in referral to substance use treatment at [facility].    Principal Problem: Schizoaffective disorder, bipolar type (HCC) Diagnosis:   Patient Active Problem List   Diagnosis Date Noted  . Cocaine abuse with cocaine-induced mood disorder (HCC) [F14.14] 05/09/2017  . Cocaine use disorder, mild, abuse (HCC) [F14.10] 01/20/2016  . Cannabis use disorder, mild, abuse [F12.10] 01/20/2016  . Chlamydia infection [A74.9] 01/20/2016  . Schizoaffective disorder, bipolar type (HCC) [F25.0] 01/13/2016  . Cocaine abuse (HCC) [F14.10] 09/22/2014   Total Time spent with patient: 30 minutes  Past Psychiatric History: see H&P  Past Medical History:  Past  Medical History:  Diagnosis Date  . Anxiety   . Bipolar affective (HCC)   . Schizophrenic disorder (HCC)    History reviewed. No pertinent surgical history. Family History: History reviewed. No pertinent family history. Family Psychiatric  History: see H&P Social History:  Social History   Substance and Sexual Activity  Alcohol Use No   Comment: Patient denies     Social History   Substance and Sexual Activity  Drug Use Yes  . Types: Marijuana, "Crack" cocaine, Cocaine   Comment: daily use recently    Social History   Socioeconomic History  . Marital status: Married    Spouse name: Not on file  . Number of children: Not on file  . Years of education: Not on file  . Highest education level: Not on file  Occupational History  . Not on file  Social Needs  . Financial resource strain: Not on file  . Food insecurity:    Worry: Not on file    Inability: Not on file  . Transportation needs:    Medical: Not on file    Non-medical: Not on file  Tobacco Use  . Smoking status: Current Every Day Smoker    Types: Cigarettes  . Smokeless tobacco: Never Used  Substance and Sexual Activity  . Alcohol use: No    Comment: Patient denies  . Drug use: Yes    Types: Marijuana, "Crack" cocaine, Cocaine    Comment: daily use recently  . Sexual activity: Yes  Lifestyle  . Physical activity:    Days per week: Not on file  Minutes per session: Not on file  . Stress: Not on file  Relationships  . Social connections:    Talks on phone: Not on file    Gets together: Not on file    Attends religious service: Not on file    Active member of club or organization: Not on file    Attends meetings of clubs or organizations: Not on file    Relationship status: Not on file  Other Topics Concern  . Not on file  Social History Narrative  . Not on file   Additional Social History:                         Sleep: Good  Appetite:  Good  Current Medications: Current  Facility-Administered Medications  Medication Dose Route Frequency Provider Last Rate Last Dose  . acetaminophen (TYLENOL) tablet 650 mg  650 mg Oral Q6H PRN Laveda Abbe, NP      . alum & mag hydroxide-simeth (MAALOX/MYLANTA) 200-200-20 MG/5ML suspension 30 mL  30 mL Oral Q4H PRN Laveda Abbe, NP      . gabapentin (NEURONTIN) capsule 300 mg  300 mg Oral BID Laveda Abbe, NP   300 mg at 05/12/17 1719  . haloperidol (HALDOL) tablet 5 mg  5 mg Oral Q6H PRN Micheal Likens, MD       Or  . haloperidol lactate (HALDOL) injection 5 mg  5 mg Intramuscular Q6H PRN Micheal Likens, MD      . hydrOXYzine (ATARAX/VISTARIL) tablet 25 mg  25 mg Oral Q6H PRN Laveda Abbe, NP   25 mg at 05/12/17 1954  . LORazepam (ATIVAN) tablet 1 mg  1 mg Oral Q6H PRN Micheal Likens, MD       Or  . LORazepam (ATIVAN) injection 1 mg  1 mg Intramuscular Q6H PRN Micheal Likens, MD      . magnesium hydroxide (MILK OF MAGNESIA) suspension 30 mL  30 mL Oral Daily PRN Laveda Abbe, NP      . mirtazapine (REMERON) tablet 15 mg  15 mg Oral QHS Laveda Abbe, NP   15 mg at 05/12/17 2129  . naltrexone (DEPADE) tablet 50 mg  50 mg Oral Daily Micheal Likens, MD   50 mg at 05/12/17 0809  . QUEtiapine (SEROQUEL) tablet 150 mg  150 mg Oral QHS Micheal Likens, MD   150 mg at 05/12/17 2129    Lab Results: No results found for this or any previous visit (from the past 48 hour(s)).  Blood Alcohol level:  Lab Results  Component Value Date   ETH <10 05/08/2017   ETH <5 02/07/2016    Metabolic Disorder Labs: Lab Results  Component Value Date   HGBA1C 5.6 01/15/2016   MPG 114 01/15/2016   Lab Results  Component Value Date   PROLACTIN 33.1 (H) 01/15/2016   Lab Results  Component Value Date   CHOL 143 01/15/2016   TRIG 91 01/15/2016   HDL 47 01/15/2016   CHOLHDL 3.0 01/15/2016   VLDL 18 01/15/2016   LDLCALC 78  01/15/2016    Physical Findings: AIMS: Facial and Oral Movements Muscles of Facial Expression: None, normal Lips and Perioral Area: None, normal Jaw: None, normal Tongue: None, normal,Extremity Movements Upper (arms, wrists, hands, fingers): None, normal Lower (legs, knees, ankles, toes): None, normal, Trunk Movements Neck, shoulders, hips: None, normal, Overall Severity Severity of abnormal movements (highest score from questions above):  None, normal Incapacitation due to abnormal movements: None, normal Patient's awareness of abnormal movements (rate only patient's report): No Awareness, Dental Status Current problems with teeth and/or dentures?: No Does patient usually wear dentures?: No  CIWA:    COWS:     Musculoskeletal: Strength & Muscle Tone: within normal limits Gait & Station: normal Patient leans: N/A  Psychiatric Specialty Exam: Physical Exam  Nursing note and vitals reviewed. Constitutional: He is oriented to person, place, and time. He appears well-developed.  Neurological: He is alert and oriented to person, place, and time.  Psychiatric: He has a normal mood and affect. His behavior is normal.    Review of Systems  Psychiatric/Behavioral: Negative for depression, hallucinations and suicidal ideas. The patient is nervous/anxious. The patient does not have insomnia.   All other systems reviewed and are negative.   Blood pressure 102/76, pulse 88, temperature 98.1 F (36.7 C), temperature source Oral, resp. rate 16, height 5\' 8"  (1.727 m), weight 85.7 kg (189 lb).Body mass index is 28.74 kg/m.  General Appearance: Casual  Eye Contact:  Good  Speech:  Clear and Coherent and Normal Rate  Volume:  Normal  Mood:  Anxious and Irritable  Affect:  Appropriate and Congruent  Thought Process:  Coherent  Orientation:  Full (Time, Place, and Person)  Thought Content:  Logical  Suicidal Thoughts:  No  Homicidal Thoughts:  No  Memory:  Immediate;   Fair Recent;    Fair Remote;   Fair  Judgement:  Poor  Insight:  Lacking  Psychomotor Activity:  Normal  Concentration:  Concentration: Fair  Recall:  [employer]Fair  Fund of Knowledge:  Fair  Language:  Fair  Akathisia:  No  Handed:    AIMS (if indicated):     Assets:  Resilience  ADL's:  Intact  Cognition:  WNL  Sleep:  Number of Hours: 6.75   Treatment Plan Summary: Daily contact with patient to assess and evaluate symptoms and progress in treatment and Medication management   -Continue current treatment plan listed below on 05/13/2017 except for noted  -Schizoaffective disorder, bipolar type   - Continue remeron 15mg  po qhs   - Continue Seroquel 150mg  po qhs  -Cocaine use disorder   -Continue naltrexone 50mg  po qDay for cravings  -Anxiety  -Continue gabapentin 300mg  po BID  - Continue vistaril 25mg  po q6h prn anxiety  -Agitation  -Continue haldol 5mg  po/IM q6h prn agitation   - Continue ativan 1mg  po/IM q6h prn agitation  -Encourage participation in groups and therapeutic milieu Disposition planning will be ongoing   Oneta Rackanika N Lewis, NP 05/13/2017, 11:15 AM   ..Agree with NP Progress Note

## 2017-05-13 NOTE — Progress Notes (Signed)
D: Howard Carlson remained in bed late. He denied SI, HI, and AVH. He reported chronic back pain. He was reluctant to leave the unit for lunch but did so. He's been calm and cooperative.   A: Meds given as ordered, including PRN Tylenol with some relief noted. Q15 safety checks maintained. Support/encouragement offered.  R: Pt remains free from harm and continues with treatment. Will continue to monitor for needs/safety.

## 2017-05-14 MED ORDER — QUETIAPINE FUMARATE 50 MG PO TABS: 150.0000 mg | ORAL_TABLET | Freq: Every day | ORAL | 0 refills | Status: DC

## 2017-05-14 MED ORDER — NALTREXONE HCL 50 MG PO TABS: 50.0000 mg | ORAL_TABLET | Freq: Every day | ORAL | 0 refills | Status: DC

## 2017-05-14 MED ORDER — QUETIAPINE FUMARATE 50 MG PO TABS
150.0000 mg | ORAL_TABLET | Freq: Every day | ORAL | Status: DC
  Filled 2017-05-14: qty 3

## 2017-05-14 MED ORDER — GABAPENTIN 300 MG PO CAPS: 300.0000 mg | ORAL_CAPSULE | Freq: Two times a day (BID) | ORAL | 0 refills | Status: DC

## 2017-05-14 NOTE — Progress Notes (Signed)
Patient ID: Howard PhillipsDeontae Raynor, male   DOB: 12-06-1990, 27 y.o.   MRN: [id number]   D: Patient denies SI/HI but continues to endorse  auditory and visual hallucinations. Patient has a depressed mood and affect. Visible on the unit. Rather immature.  A: Patient given emotional support from RN. Patient given medications per MD orders. Patient encouraged to attend groups and unit activities. Patient encouraged to come to staff with any questions or concerns.  R: Patient remains cooperative and appropriate. Will continue to monitor patient for safety.

## 2017-05-14 NOTE — [facility] Group Notes (Signed)
[facility] Group Notes: (Clinical Social Work)   05/14/2017      Type of Therapy:  Group Therapy   Participation Level:  Did Not Attend despite MHT prompting   Hridhaan Yohn Grossman-Orr, LCSW 05/14/2017, 12:35 PM      

## 2017-05-14 NOTE — Progress Notes (Signed)
Howard Carlson was pacing the hallway asking if he had any meds. "I feel like I'm all over the place." He has expressed interest in discharge. Called Hillery Jacksanika Lewis FNP, who was en route to unit, about pt. PRN Ativan given (see MAR).

## 2017-05-14 NOTE — Progress Notes (Signed)
  [facility] Dis[facility] :  Will you be returning to the same living situation after discharge:  No. Staying with a friend At discharge, do you have transportation home?: Yes,  bus pass Do you have the ability to pay for your medications: Yes,  menrtal health  Release of information consent forms completed and in the chart;  Patient's signature needed at discharge.  Patient to Follow up at: Follow-up Information    [facility] Follow up on 05/18/2017.   Specialty:  Behavioral Health Why:  Thursday at 8:00 with Nicole Cellaorothy for your hospital follow up appointment. Please bring along your ID and your hospital d/c paperwork Contact information: [address] [place] [address] [phone]        Services, [facility] Follow up on 05/16/2017.   Why:  Tuesday at 8AM sharp for your screening for admission appointment.  Bring your ID and at least 2 weeks of meds, or you will not be admitted. Contact information: [address] [place] [address] [phone]           Next level of care provider [facility] Link:no  Safety Planning and Suicide Prevention discussed: Yes,  yes  Have you used any form of tobacco in the last 30 days? (Cigarettes, Smokeless Tobacco, Cigars, and/or Pipes): Yes  Has patient been referred to the Quitline?: Patient refused referral  Patient has been referred for addiction treatment: Yes  Ida RogueRodney B Bunnie Lederman, LCSW 05/14/2017, 1:43 PM

## 2017-05-14 NOTE — Tx Team (Signed)
Interdisciplinary Treatment and Diagnostic Plan Update  05/14/2017 Time of Session: 1:40 PM  Howard Carlson MRN: [id number]  Principal Diagnosis: Schizoaffective disorder, bipolar type (HCC)  Secondary Diagnoses: Principal Problem:   Schizoaffective disorder, bipolar type (HCC) Active Problems:   Cocaine use disorder, mild, abuse (HCC)   Current Medications:  Current Facility-Administered Medications  Medication Dose Route Frequency Provider Last Rate Last Dose  . acetaminophen (TYLENOL) tablet 650 mg  650 mg Oral Q6H PRN Laveda Abbe, NP   650 mg at 05/13/17 2233  . alum & mag hydroxide-simeth (MAALOX/MYLANTA) 200-200-20 MG/5ML suspension 30 mL  30 mL Oral Q4H PRN Laveda Abbe, NP   30 mL at 05/13/17 1805  . gabapentin (NEURONTIN) capsule 300 mg  300 mg Oral BID Laveda Abbe, NP   300 mg at 05/14/17 6578  . haloperidol (HALDOL) tablet 5 mg  5 mg Oral Q6H PRN Micheal Likens, MD   5 mg at 05/14/17 0827   Or  . haloperidol lactate (HALDOL) injection 5 mg  5 mg Intramuscular Q6H PRN Micheal Likens, MD      . hydrOXYzine (ATARAX/VISTARIL) tablet 25 mg  25 mg Oral Q6H PRN Laveda Abbe, NP   25 mg at 05/13/17 2233  . LORazepam (ATIVAN) tablet 1 mg  1 mg Oral Q6H PRN Micheal Likens, MD   1 mg at 05/14/17 1333   Or  . LORazepam (ATIVAN) injection 1 mg  1 mg Intramuscular Q6H PRN Micheal Likens, MD      . magnesium hydroxide (MILK OF MAGNESIA) suspension 30 mL  30 mL Oral Daily PRN Laveda Abbe, NP      . mirtazapine (REMERON) tablet 15 mg  15 mg Oral QHS Laveda Abbe, NP   15 mg at 05/13/17 2233  . naltrexone (DEPADE) tablet 50 mg  50 mg Oral Daily Micheal Likens, MD   50 mg at 05/14/17 4696  . QUEtiapine (SEROQUEL) tablet 150 mg  150 mg Oral QHS Micheal Likens, MD   150 mg at 05/13/17 2232    PTA Medications: Medications Prior to Admission  Medication Sig Dispense Refill  Last Dose  . hydrOXYzine (ATARAX/VISTARIL) 25 MG tablet Take 1 tablet (25 mg total) by mouth every 6 (six) hours as needed for anxiety. (Patient not taking: Reported on 05/08/2017) 30 tablet 0 Not Taking at Unknown time  . mirtazapine (REMERON) 15 MG tablet Take 1 tablet (15 mg total) by mouth at bedtime. (Patient not taking: Reported on 05/08/2017) 30 tablet 0 Not Taking at Unknown time  . nicotine polacrilex (NICORETTE) 2 MG gum Take 1 each (2 mg total) by mouth as needed for smoking cessation. (Patient not taking: Reported on 05/08/2017) 100 tablet 0 Not Taking at Unknown time  . QUEtiapine (SEROQUEL) 100 MG tablet Take 1 tablet (100 mg total) by mouth at bedtime. (Patient not taking: Reported on 05/08/2017) 30 tablet 0 Not Taking at Unknown time  . venlafaxine XR (EFFEXOR-XR) 75 MG 24 hr capsule Take 1 capsule (75 mg total) by mouth daily with breakfast. (Patient not taking: Reported on 05/08/2017) 30 capsule 0 Not Taking at Unknown time    Treatment Modalities: Medication Management, Group therapy, Case management,  1 to 1 session with clinician, Psychoeducation, Recreational therapy.  Patient Stressors: Marital or family conflict Medication change or noncompliance Substance abuse  Patient Strengths: Ability for insight Average or above average intelligence Capable of independent living [employer] of knowledge Motivation for treatment/growth  Physician Treatment Plan for Primary Diagnosis: Schizoaffective disorder, bipolar type (HCC) Long Term Goal(s): Improvement in symptoms so as ready for discharge  Short Term Goals: Ability to identify and develop effective coping behaviors will improve Ability to identify triggers associated with substance abuse/mental health issues will improve  Medication Management: Evaluate patient's response, side effects, and tolerance of medication regimen.  Therapeutic Interventions: 1 to 1 sessions, Unit Group sessions and Medication  administration.  Evaluation of Outcomes: Adequate for Discharge  Physician Treatment Plan for Secondary Diagnosis: Principal Problem:   Schizoaffective disorder, bipolar type (HCC) Active Problems:   Cocaine use disorder, mild, abuse (HCC)  Long Term Goal(s): Improvement in symptoms so as ready for discharge  Short Term Goals: Ability to identify and develop effective coping behaviors will improve Ability to identify triggers associated with substance abuse/mental health issues will improve  Medication Management: Evaluate patient's response, side effects, and tolerance of medication regimen.  Therapeutic Interventions: 1 to 1 sessions, Unit Group sessions and Medication administration.  Evaluation of Outcomes: Adequate for Discharge   RN Treatment Plan for Primary Diagnosis: Schizoaffective disorder, bipolar type (HCC) Long Term Goal(s): Knowledge of disease and therapeutic regimen to maintain health will improve  Short Term Goals: Ability to remain free from injury will improve, Ability to participate in decision making will improve, Ability to disclose and discuss suicidal ideas, Ability to identify and develop effective coping behaviors will improve and Compliance with prescribed medications will improve  Medication Management: RN will administer medications as ordered by provider, will assess and evaluate patient's response and provide education to patient for prescribed medication. RN will report any adverse and/or side effects to prescribing provider.  Therapeutic Interventions: 1 on 1 counseling sessions, Psychoeducation, Medication administration, Evaluate responses to treatment, Monitor vital signs and CBGs as ordered, Perform/monitor CIWA, COWS, AIMS and Fall Risk screenings as ordered, Perform wound care treatments as ordered.  Evaluation of Outcomes: Adequate for Discharge   LCSW Treatment Plan for Primary Diagnosis: Schizoaffective disorder, bipolar type (HCC) Long  Term Goal(s): Safe transition to appropriate next level of care at discharge, Engage patient in therapeutic group addressing interpersonal concerns.  Short Term Goals: Engage patient in aftercare planning with referrals and resources, Facilitate acceptance of mental health diagnosis and concerns, Identify triggers associated with mental health/substance abuse issues and Increase skills for wellness and recovery  Therapeutic Interventions: Assess for all discharge needs, 1 to 1 time with Social worker, Explore available resources and support systems, Assess for adequacy in community support network, Educate family and significant other(s) on suicide prevention, Complete Psychosocial Assessment, Interpersonal group therapy.  Evaluation of Outcomes: Adequate for Discharge   Progress in Treatment: Attending groups: Yes Participating in groups: Yes Taking medication as prescribed: Yes Toleration of medication: Yes, no side effects reported at this time Family/Significant other contact made: yes, Jeani Sow [phone] Patient understands diagnosis: Yes AEB, asking for substance use treatment  Discussing patient identified problems/goals with staff: Yes Medical problems stabilized or resolved: Yes Denies suicidal/homicidal ideation: Yes Issues/concerns per patient self-inventory: None Other: N/A  New problem(s) identified: None identified at this time.   New Short Term/Long Term Goal(s): "I want to kick this habit and get clean".   Discharge Plan or Barriers:  Pt is interested in shelters and would like to go to follow up at [facility] for substance use.  4/21:  Pt states he will stay with a friend, and go to his follow up appointments from there  Reason for Continuation of Hospitalization:  Estimated Length of Stay:  D/C today  Attendees: Patient:  05/14/2017  1:40 PM  Physician: Jolyne Loahristopher Rainville, MD 05/14/2017  1:40 PM  Nursing: Melissa NoonKaren Shugart, RN 05/14/2017  1:40 PM  RN Care  Manager: Onnie BoerJennifer Clark, RN 05/14/2017  1:40 PM  Social Worker: Richelle Itood Kratos Ruscitti, LCSW; Melba CoonAngel Webster, Social Work Intern 05/14/2017  1:40 PM  Recreational Therapist: Caroll RancherMarjette Lindsay, LRT 05/14/2017  1:40 PM  Other: Tomasita Morrowelora Sutton, P4CC 05/14/2017  1:40 PM  Other:  05/14/2017  1:40 PM  Other: 05/14/2017  1:40 PM    Scribe for Treatment Team: Ida Rogueodney B Florita Nitsch, LCSW 05/14/2017 1:40 PM

## 2017-05-14 NOTE — Progress Notes (Signed)
Patient ID: Howard PhillipsDeontae Carlson, male   DOB: May 30, 1990, 27 y.o.   MRN: [id number]  Patient discharged per MD orders. Patient given education regarding follow-up appointments and medications. Patient denies any questions or concerns about these instructions. Patient was escorted to locker and given belongings before discharge to hospital lobby. Patient currently denies SI/HI and auditory and visual hallucinations on discharge.

## 2017-05-14 NOTE — [facility] Suicide Risk Assessment (Signed)
[facility] Discharge Suicide Risk Assessment   Principal Problem: Schizoaffective disorder, bipolar type [facility] Discharge Diagnoses:  Patient Active Problem List   Diagnosis Date Noted  . Cocaine abuse with cocaine-induced mood disorder (HCC) [F14.14] 05/09/2017  . Cocaine use disorder, mild, abuse (HCC) [F14.10] 01/20/2016  . Cannabis use disorder, mild, abuse [F12.10] 01/20/2016  . Chlamydia infection [A74.9] 01/20/2016  . Schizoaffective disorder, bipolar type (HCC) [F25.0] 01/13/2016  . Cocaine abuse (HCC) [F14.10] 09/22/2014    Total Time spent with patient: 30 minutes  Musculoskeletal: Strength & Muscle Tone: within normal limits Gait & Station: normal Patient leans: N/A  Psychiatric Specialty Exam: ROS denies headache , no chest pain, no shortness of breath, no vomiting , no fever  Blood pressure 112/90, pulse 73, temperature 98.3 F (36.8 C), temperature source Oral, resp. rate 18, height 5\' 8"  (1.727 m), weight 85.7 kg (189 lb).Body mass index is 28.74 kg/m.  General Appearance: Well Groomed  Patent attorney::  Good  Speech:  Normal Rate409  Volume:  Normal  Mood:  improved , states " this is the best I have felt in a while"  Affect:  Appropriate and more reactive   Thought Process:  Linear and Descriptions of Associations: Intact  Orientation:  Other:  fully alert and attentive   Thought Content:  denies hallucinations, no delusions, not internally preoccupied   Suicidal Thoughts:  No denies any suicidal or self injurious ideations, denies any homicidal or violent ideations, future oriented .   Homicidal Thoughts:  No  Memory:  recent and remote grossly intact   Judgement:  Other:  improving   Insight:  improving  Psychomotor Activity:  Normal  Concentration:  Good  Recall:  Good  Fund of Knowledge:Good  Language: Good  Akathisia:  Negative  Handed:  Right  AIMS (if indicated):   no abnormal or involuntary movements noted or reported.  Assets:  Communication  Skills Desire for Improvement Resilience  Sleep:  Number of Hours: 6  Cognition: WNL  ADL's:  Intact   Mental Status Per Nursing Assessment::   On Admission:     Demographic Factors:  27 year old, married ,  4 children, lives with wife and two children, employed   Loss Factors: Relapse ( cocaine ) , marital stressors, concerns about employment   Historical Factors: One prior psychiatric admission, history of substance abuse ( cocaine), denies alcohol abuse, has bene diagnosed with Schizoaffective Disorder in the past .   Risk Reduction Factors:   Responsible for children under 33 years of age, Sense of responsibility to family, Living with another person, especially a relative and Positive coping skills or problem solving skills  Continued Clinical Symptoms:  At this time patient is alert, attentive,well related, mood improved and currently denies feeling depressed, affect is appropriate and reactive, no thought disorder, no suicidal or self injurious ideations, no homicidal ideations, no hallucinations, no delusions, future oriented . Behavior on unit in good control. Denies medication side effects. We reviewed side effects, including opiate blocking properties of Depade and potential for sedation, weight gain, metabolic and movement disorders on Seroquel   Cognitive Features That Contribute To Risk:  No gross cognitive deficits noted upon discharge. Is alert , attentive, and oriented x 3   Suicide Risk:  Mild:  Suicidal ideation of limited frequency, intensity, duration, and specificity.  There are no identifiable plans, no associated intent, mild dysphoria and related symptoms, good self-control (both objective and subjective assessment), few other risk factors, and identifiable protective factors, including  available and accessible social support.  Follow-up Information    [facility] Follow up on 05/18/2017.   Specialty:  Behavioral Health Why:  Thursday at 8:00 with Nicole Cellaorothy for  your hospital follow up appointment. Please bring along your ID and your hospital d/c paperwork Contact information: [address] [place] [address] [phone]        Services, [facility] Follow up on 05/16/2017.   Why:  Tuesday at 8AM sharp for your screening for admission appointment.  Bring your ID and at least 2 weeks of meds, or you will not be admitted. Contact information: [place] [address] [place] [address] [phone]           Plan Of Care/Follow-up recommendations:  Activity:  as tolerated Diet:  regular Tests:  NA Other:  See below  As reviewed with staff and in chart notes, disposition planning in progress and  patient OK for discharge as he continues to improve. At this time patient expresses readiness for discharge and there are no current grounds for involuntary commitment  Leaving unit in good spirits  Plans to return home Plans to follow up as above   Craige CottaFernando A Cobos, MD 05/14/2017, 1:35 PM

## 2017-05-14 NOTE — Discharge Summary (Addendum)
Physician Discharge Summary Note  Patient:  Howard Carlson is an 27 y.o., male MRN:  [id number] DOB:  1990/06/13 Patient phone:  [phone] (home)  Patient address:   [address] [place] [address],  Total Time spent with patient: 30 minutes  Date of Admission:  05/10/2017 Date of Discharge: 05/14/2017  Reason for Admission:  Per assessment note-Howard Carlson is a 27 y/o M with history of schizoaffective disorder bipolar type and cocaine use disorder who was admitted on IVC from [facility] after he presented with worsening depression, SI with plan to jump from a bridge, and worsening use of crack cocaine. Pt was placed on IVC in ED due to ongoing SI and poor insight. He was transferred to [facility] for additional treatment and evaluation.  Principal Problem: Schizoaffective disorder, bipolar type Howard Carlson) Discharge Diagnoses: Patient Active Problem List   Diagnosis Date Noted  . Cocaine abuse with cocaine-induced mood disorder (HCC) [F14.14] 05/09/2017  . Cocaine use disorder, mild, abuse (HCC) [F14.10] 01/20/2016  . Cannabis use disorder, mild, abuse [F12.10] 01/20/2016  . Chlamydia infection [A74.9] 01/20/2016  . Schizoaffective disorder, bipolar type (HCC) [F25.0] 01/13/2016  . Cocaine abuse [facility]) [F14.10] 09/22/2014    Past Psychiatric History:   Past Medical History:  Past Medical History:  Diagnosis Date  . Anxiety   . Bipolar affective (HCC)   . Schizophrenic disorder (HCC)    History reviewed. No pertinent surgical history. Family History: History reviewed. No pertinent family history. Family Psychiatric  History: Social History:  Social History   Substance and Sexual Activity  Alcohol Use No   Comment: Patient denies     Social History   Substance and Sexual Activity  Drug Use Yes  . Types: Marijuana, "Crack" cocaine, Cocaine   Comment: daily use recently    Social History   Socioeconomic History  . Marital status: Married    Spouse name: Not on  file  . Number of children: Not on file  . Years of education: Not on file  . Highest education level: Not on file  Occupational History  . Not on file  Social Needs  . Financial resource strain: Not on file  . Food insecurity:    Worry: Not on file    Inability: Not on file  . Transportation needs:    Medical: Not on file    Non-medical: Not on file  Tobacco Use  . Smoking status: Current Every Day Smoker    Types: Cigarettes  . Smokeless tobacco: Never Used  Substance and Sexual Activity  . Alcohol use: No    Comment: Patient denies  . Drug use: Yes    Types: Marijuana, "Crack" cocaine, Cocaine    Comment: daily use recently  . Sexual activity: Yes  Lifestyle  . Physical activity:    Days per week: Not on file    Minutes per session: Not on file  . Stress: Not on file  Relationships  . Social connections:    Talks on phone: Not on file    Gets together: Not on file    Attends religious service: Not on file    Active member of club or organization: Not on file    Attends meetings of clubs or organizations: Not on file    Relationship status: Not on file  Other Topics Concern  . Not on file  Social History Narrative  . Not on file    Hospital Course:  Howard Carlson was admitted for Schizoaffective disorder, bipolar type (HCC)  and crisis management.  Pt was treated discharged with the medications listed below under Medication List.  Medical problems were identified and treated as needed.  Home medications were restarted as appropriate.  Improvement was monitored by observation and Howard Carlson 's daily report of symptom reduction.  Emotional and mental status was monitored by daily self-inventory reports completed by Howard Carlson and clinical staff.         Howard Carlson was evaluated by the treatment team for stability and plans for continued recovery upon discharge. Howard Carlson 's motivation was an integral factor for scheduling further  treatment. Employment, transportation, bed availability, health status, family support, and any pending legal issues were also considered during hospital stay. Pt was offered further treatment options upon discharge including but not limited to Residential, Intensive Outpatient, and Outpatient treatment.  Howard Carlson will follow up with the services as listed below under Follow Up Information.     Upon completion of this admission the patient was both mentally and medically stable for discharge denying suicidal or homicidal ideation, auditory/visual/tactile hallucinations, delusional thoughts and paranoia. Patient continues to reports slightly anxiety. States overall his mood is improving and has plans to follow up at [facility]. Patient reports he will attempt to go back home to [place] to work with his father.   Howard Carlson responded well to treatment with Gabapentin 300 mg, Seroquel 150 mg, Depade 50 mg without adverse effects.  Pt demonstrated improvement without reported or observed adverse effects to the point of stability appropriate for outpatient management. Pertinent labs include: Cocaine, THC , for which outpatient follow-up is necessary for lab recheck as mentioned below. Reviewed CBC, CMP, BAL, and UDS; all unremarkable aside from noted exceptions.   Physical Findings: AIMS: Facial and Oral Movements Muscles of Facial Expression: None, normal Lips and Perioral Area: None, normal Jaw: None, normal Tongue: None, normal,Extremity Movements Upper (arms, wrists, hands, fingers): None, normal Lower (legs, knees, ankles, toes): None, normal, Trunk Movements Neck, shoulders, hips: None, normal, Overall Severity Severity of abnormal movements (highest score from questions above): None, normal Incapacitation due to abnormal movements: None, normal Patient's awareness of abnormal movements (rate only patient's report): No Awareness, Dental Status Current problems with teeth and/or  dentures?: No Does patient usually wear dentures?: No  CIWA:    COWS:     Musculoskeletal: Strength & Muscle Tone: within normal limits Gait & Station: normal Patient leans: N/A  Psychiatric Specialty Exam: See SRA by MD  Physical Exam  Vitals reviewed. Constitutional: He is oriented to person, place, and time. He appears well-developed.  Cardiovascular: Normal rate.  Neurological: He is alert and oriented to person, place, and time.  Psychiatric: He has a normal mood and affect. His behavior is normal.    Review of Systems  Psychiatric/Behavioral: Negative for depression and suicidal ideas. The patient is nervous/anxious.   All other systems reviewed and are negative.   Blood pressure 112/90, pulse 73, temperature 98.3 F (36.8 C), temperature source Oral, resp. rate 18, height 5\' 8"  (1.727 m), weight 85.7 kg (189 lb).Body mass index is 28.74 kg/m.    Have you used any form of tobacco in the last 30 days? (Cigarettes, Smokeless Tobacco, Cigars, and/or Pipes): Yes  Has this patient used any form of tobacco in the last 30 days? (Cigarettes, Smokeless Tobacco, Cigars, and/or Pipes) No  Blood Alcohol level:  Lab Results  Component Value Date   ETH <10 05/08/2017   ETH <5 02/07/2016    Metabolic Disorder Labs:  Lab Results  Component Value Date   HGBA1C 5.6 01/15/2016   MPG 114 01/15/2016   Lab Results  Component Value Date   PROLACTIN 33.1 (H) 01/15/2016   Lab Results  Component Value Date   CHOL 143 01/15/2016   TRIG 91 01/15/2016   HDL 47 01/15/2016   CHOLHDL 3.0 01/15/2016   VLDL 18 01/15/2016   LDLCALC 78 01/15/2016    See Psychiatric Specialty Exam and Suicide Risk Assessment completed by Attending Physician prior to discharge.  Discharge destination:  Home  Is patient on multiple antipsychotic therapies at discharge:  No   Has Patient had three or more failed trials of antipsychotic monotherapy by history:  No  Recommended Plan for Multiple  Antipsychotic Therapies: NA  Discharge Instructions    Diet - low sodium heart healthy   Complete by:  As directed    Discharge instructions   Complete by:  As directed    Take all medications as prescribed. Keep all follow-up appointments as scheduled.  Do not consume alcohol or use illegal drugs while on prescription medications. Report any adverse effects from your medications to your primary Carlson provider promptly.  In the event of recurrent symptoms or worsening symptoms, call 911, a crisis hotline, or go to the nearest emergency department for evaluation.   Increase activity slowly   Complete by:  As directed      Allergies as of 05/14/2017      Reactions   Amoxicillin Anaphylaxis, Other (See Comments)   Has patient had a PCN reaction causing immediate rash, facial/tongue/throat swelling, SOB or lightheadedness with hypotension: Yes Has patient had a PCN reaction causing severe rash involving mucus membranes or skin necrosis: No Has patient had a PCN reaction that required hospitalization No Has patient had a PCN reaction occurring within the last 10 years: No If all of the above answers are "NO", then may proceed with Cephalosporin use.      Medication List    STOP taking these medications   hydrOXYzine 25 MG tablet Commonly known as:  ATARAX/VISTARIL   mirtazapine 15 MG tablet Commonly known as:  REMERON   nicotine polacrilex 2 MG gum Commonly known as:  NICORETTE   venlafaxine XR 75 MG 24 hr capsule Commonly known as:  EFFEXOR-XR     TAKE these medications     Indication  gabapentin 300 MG capsule Commonly known as:  NEURONTIN Take 1 capsule (300 mg total) by mouth 2 (two) times daily.  Indication:  Abuse or Misuse of Alcohol, cocaine   naltrexone 50 MG tablet Commonly known as:  DEPADE Take 1 tablet (50 mg total) by mouth daily. Start taking on:  05/15/2017  Indication:  Excessive Use of Alcohol   QUEtiapine 50 MG tablet Commonly known as:   SEROQUEL Take 3 tablets (150 mg total) by mouth at bedtime. What changed:    medication strength  how much to take  Indication:  Depressive Phase of Manic-Depression, mood stabilization      Follow-up Information    [facility] Follow up on 05/18/2017.   Specialty:  Behavioral Health Why:  Thursday at 8:00 with Nicole Cellaorothy for your hospital follow up appointment. Please bring along your ID and your hospital d/c paperwork Contact information: [address] [place] [address] [phone]        [facility] Follow up on 05/16/2017.   Why:  Tuesday at 8AM sharp for your screening for admission appointment.  Bring your ID and at least 2 weeks  of meds, or you will not be admitted. Contact information: [place] [address] [phone]           Follow-up recommendations:  Activity:  as tolerated Diet:  heart healthy  Comments:  Take all medications as prescribed. Keep all follow-up appointments as scheduled.  Do not consume alcohol or use illegal drugs while on prescription medications. Report any adverse effects from your medications to your primary Carlson provider promptly.  In the event of recurrent symptoms or worsening symptoms, call 911, a crisis hotline, or go to the nearest emergency department for evaluation.   Signed: Oneta Rack, NP 05/14/2017, 1:54 PM   Patient seen, Suicide Assessment Completed.  Disposition Plan Reviewed

## 2017-05-18 ENCOUNTER — Ambulatory Visit (HOSPITAL_COMMUNITY)
Admission: RE | Admit: 2017-05-18 | Discharge: 2017-05-18 | Disposition: A | Discharge status: Home / Self Care | Payer: Medicaid Other | Attending: Psychiatry | Admitting: Psychiatry

## 2017-05-18 DIAGNOSIS — F17210 Nicotine dependence, cigarettes, uncomplicated: Secondary | ICD-10-CM | POA: Diagnosis not present

## 2017-05-18 DIAGNOSIS — Z880 Allergy status to penicillin: Secondary | ICD-10-CM | POA: Diagnosis not present

## 2017-05-18 DIAGNOSIS — F419 Anxiety disorder, unspecified: Secondary | ICD-10-CM | POA: Insufficient documentation

## 2017-05-18 DIAGNOSIS — F209 Schizophrenia, unspecified: Secondary | ICD-10-CM | POA: Insufficient documentation

## 2017-05-18 DIAGNOSIS — F1420 Cocaine dependence, uncomplicated: Secondary | ICD-10-CM | POA: Diagnosis not present

## 2017-05-18 DIAGNOSIS — F331 Major depressive disorder, recurrent, moderate: Secondary | ICD-10-CM | POA: Diagnosis present
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
## 2017-05-18 NOTE — H&P (Signed)
Behavioral Health Medical Screening Exam  Howard Carlson Senaida Carlson is an 27 y.o. male patient [facility] as walking requesting resources for substance use disorder.  [facility] and left [facility] today "They don't feed me enough over there.  My kids eat more than what they feed you.  Patient denies suicidal/self harm/homicidal ideation, psychosis, and paranoia. Patient wants to stay in hospital until [facility] Total Time spent with patient: 45 minutes  Psychiatric Specialty Exam: Physical Exam  Vitals reviewed. Constitutional: He is oriented to person, place, and time. He appears well-developed and well-nourished.  HENT:  Head: Normocephalic.  Neck: Normal range of motion. Neck supple.  Respiratory: Effort normal.  Musculoskeletal: Normal range of motion.  Neurological: He is alert and oriented to person, place, and time.  Skin: Skin is warm and dry.    Review of Systems  Psychiatric/Behavioral: Positive for depression (Stable) and substance abuse (Cocaine). Hallucinations: Denies. Suicidal ideas: Denies. Nervous/anxious: Denies. Insomnia: Denies.   All other systems reviewed and are negative.   Blood pressure 125/90, pulse 99, temperature 98.9 F (37.2 C), resp. rate 16, SpO2 99 %.There is no height or weight on file to calculate BMI.  General Appearance: Casual  Eye Contact:  Good  Speech:  Clear and Coherent and Normal Rate  Volume:  Normal  Mood:  Irritable  Affect:  Appropriate and Congruent  Thought Process:  Coherent and Goal Directed  Orientation:  Full (Time, Place, and Person)  Thought Content:  Logical  Suicidal Thoughts:  No  Homicidal Thoughts:  No  Memory:  Immediate;   Good Recent;   Good Remote;   Good  Judgement:  Fair  Insight:  Fair  Psychomotor Activity:  Normal  Concentration: Concentration: Fair and Attention Span: Fair  Recall:  Good  Fund of Knowledge:Fair  Language: Good   Akathisia:  No  Handed:  Right  AIMS (if indicated):     Assets:  Communication Skills Desire for Improvement  Sleep:       Musculoskeletal: Strength & Muscle Tone: within normal limits Gait & Station: normal Patient leans: N/A  Blood pressure 125/90, pulse 99, temperature 98.9 F (37.2 C), resp. rate 16, SpO2 99 %.  Recommendations:  community resources for substance use disorder  Based on my evaluation the patient does not appear to have an emergency medical condition.  Shareen Capwell, NP 05/18/2017, 6:55 PM

## 2017-05-29 ENCOUNTER — Ambulatory Visit (INDEPENDENT_AMBULATORY_CARE_PROVIDER_SITE_OTHER): Payer: Self-pay | Admitting: Physician Assistant

## 2017-05-29 DIAGNOSIS — Y929 Unspecified place or not applicable: Secondary | ICD-10-CM | POA: Diagnosis not present

## 2017-05-29 DIAGNOSIS — S6991XA Unspecified injury of right wrist, hand and finger(s), initial encounter: Secondary | ICD-10-CM | POA: Diagnosis not present

## 2017-05-29 DIAGNOSIS — W2209XA Striking against other stationary object, initial encounter: Secondary | ICD-10-CM | POA: Insufficient documentation

## 2017-05-29 DIAGNOSIS — F1414 Cocaine abuse with cocaine-induced mood disorder: Secondary | ICD-10-CM | POA: Insufficient documentation

## 2017-05-29 DIAGNOSIS — Y939 Activity, unspecified: Secondary | ICD-10-CM | POA: Diagnosis not present

## 2017-05-29 DIAGNOSIS — R45851 Suicidal ideations: Secondary | ICD-10-CM | POA: Diagnosis not present

## 2017-05-29 DIAGNOSIS — Y999 Unspecified external cause status: Secondary | ICD-10-CM | POA: Insufficient documentation

## 2017-05-29 DIAGNOSIS — F1210 Cannabis abuse, uncomplicated: Secondary | ICD-10-CM | POA: Diagnosis not present

## 2017-05-29 DIAGNOSIS — F17210 Nicotine dependence, cigarettes, uncomplicated: Secondary | ICD-10-CM | POA: Diagnosis not present

## 2017-05-29 DIAGNOSIS — F250 Schizoaffective disorder, bipolar type: Secondary | ICD-10-CM | POA: Insufficient documentation

## 2017-05-29 DIAGNOSIS — Z79899 Other long term (current) drug therapy: Secondary | ICD-10-CM | POA: Diagnosis not present

## 2017-05-29 DIAGNOSIS — F329 Major depressive disorder, single episode, unspecified: Secondary | ICD-10-CM | POA: Diagnosis present

## 2017-05-30 ENCOUNTER — Emergency Department (HOSPITAL_COMMUNITY): Payer: Medicaid Other

## 2017-05-30 ENCOUNTER — Emergency Department (HOSPITAL_COMMUNITY)
Admission: EM | Admit: 2017-05-30 | Discharge: 2017-05-30 | Disposition: A | Discharge status: Home / Self Care | Payer: Medicaid Other | Attending: Emergency Medicine | Admitting: Emergency Medicine

## 2017-05-30 ENCOUNTER — Encounter (HOSPITAL_COMMUNITY): Payer: Self-pay

## 2017-05-30 DIAGNOSIS — R45851 Suicidal ideations: Secondary | ICD-10-CM

## 2017-05-30 DIAGNOSIS — F1414 Cocaine abuse with cocaine-induced mood disorder: Secondary | ICD-10-CM

## 2017-05-30 LAB — CBC WITH DIFFERENTIAL/PLATELET
BASOS ABS: 0 10*3/uL (ref 0.0–0.1)
Basophils Relative: 1 %
EOS ABS: 0.1 10*3/uL (ref 0.0–0.7)
EOS PCT: 2 %
HCT: 40.2 % (ref 39.0–52.0)
Hemoglobin: 13.8 g/dL (ref 13.0–17.0)
Lymphocytes Relative: 44 %
Lymphs Abs: 2.9 10*3/uL (ref 0.7–4.0)
MCH: 28.5 pg (ref 26.0–34.0)
MCHC: 34.3 g/dL (ref 30.0–36.0)
MCV: 82.9 fL (ref 78.0–100.0)
MONO ABS: 0.9 10*3/uL (ref 0.1–1.0)
Monocytes Relative: 13 %
Neutro Abs: 2.6 10*3/uL (ref 1.7–7.7)
Neutrophils Relative %: 40 %
PLATELETS: 191 10*3/uL (ref 150–400)
RBC: 4.85 MIL/uL (ref 4.22–5.81)
RDW: 13.7 % (ref 11.5–15.5)
WBC: 6.6 10*3/uL (ref 4.0–10.5)

## 2017-05-30 LAB — COMPREHENSIVE METABOLIC PANEL
ALT: 16 U/L — AB (ref 17–63)
ANION GAP: 11 (ref 5–15)
AST: 20 U/L (ref 15–41)
Albumin: 3.8 g/dL (ref 3.5–5.0)
Alkaline Phosphatase: 57 U/L (ref 38–126)
BUN: 12 mg/dL (ref 6–20)
CHLORIDE: 102 mmol/L (ref 101–111)
CO2: 24 mmol/L (ref 22–32)
CREATININE: 1.14 mg/dL (ref 0.61–1.24)
Calcium: 9.1 mg/dL (ref 8.9–10.3)
GFR calc Af Amer: >60 mL/min (ref >60)
GFR calc non Af Amer: >60 mL/min (ref >60)
Glucose, Bld: 115 mg/dL — ABNORMAL HIGH (ref 65–99)
POTASSIUM: 3.3 mmol/L — AB (ref 3.5–5.1)
SODIUM: 137 mmol/L (ref 135–145)
Total Bilirubin: 0.4 mg/dL (ref 0.3–1.2)
Total Protein: 7.8 g/dL (ref 6.5–8.1)

## 2017-05-30 LAB — ETHANOL

## 2017-05-30 MED ORDER — IBUPROFEN 200 MG PO TABS: 600.0000 mg | ORAL_TABLET | Freq: Three times a day (TID) | ORAL | Status: DC | PRN

## 2017-05-30 NOTE — ED Notes (Signed)
Patient arrived in unit cooperative and calm.  Belongings placed in locker #41.  Patient denies suicidal or homicidal thoughts.  Patient asked for an additional blanket, asked to have the TV turned off, then went to sleep.
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
## 2017-05-30 NOTE — ED Provider Notes (Signed)
[facility]-EMERGENCY DEPT Provider Note   CSN: [id number] Arrival date & time: 05/29/17  2349     History   Chief Complaint Chief Complaint  Patient presents with  . Suicidal    HPI Howard Carlson is a 27 y.o. male.  Patient presents to the emergency department for psychiatric evaluation.  He initially reported that he was suicidal, but now he states that he is not suicidal.  He reports that he has been doing drugs and around people that are doing drugs and he wanted to get out of that environment.  He reports he became angry earlier and punched a wall.  He is complaining of pain in his right hand after punching the wall.  Patient appears to be having difficulty focusing, has difficulty answering questions thoroughly. Level V Caveat due to psychiatric disorder.     Past Medical History:  Diagnosis Date  . Anxiety   . Bipolar affective (HCC)   . Schizophrenic disorder [facility])     Patient Active Problem List   Diagnosis Date Noted  . Cocaine abuse with cocaine-induced mood disorder (HCC) 05/09/2017  . Cocaine use disorder, mild, abuse (HCC) 01/20/2016  . Cannabis use disorder, mild, abuse 01/20/2016  . Chlamydia infection 01/20/2016  . Schizoaffective disorder, bipolar type (HCC) 01/13/2016  . Cocaine abuse (HCC) 09/22/2014    History reviewed. No pertinent surgical history.      Home Medications    Prior to Admission medications   Medication Sig Start Date End Date Taking? Authorizing Provider  gabapentin (NEURONTIN) 300 MG capsule Take 1 capsule (300 mg total) by mouth 2 (two) times daily. 05/14/17   Oneta Rack, NP  naltrexone (DEPADE) 50 MG tablet Take 1 tablet (50 mg total) by mouth daily. 05/15/17   Oneta Rack, NP  QUEtiapine (SEROQUEL) 50 MG tablet Take 3 tablets (150 mg total) by mouth at bedtime. 05/14/17   Oneta Rack, NP    Family History History reviewed. No pertinent family history.  Social History Social History    Tobacco Use  . Smoking status: Current Every Day Smoker    Types: Cigarettes  . Smokeless tobacco: Never Used  Substance Use Topics  . Alcohol use: No    Comment: Patient denies  . Drug use: Yes    Types: Marijuana, "Crack" cocaine, Cocaine    Comment: daily use recently     Allergies   Amoxicillin   Review of Systems Review of Systems  Unable to perform ROS: Psychiatric disorder     Physical Exam Updated Vital Signs BP 104/68 (BP Location: Left Arm)   Pulse (!) 112   Temp 98.3 F (36.8 C) (Oral)   Resp 14   SpO2 98%   Physical Exam  Constitutional: He is oriented to person, place, and time. He appears well-developed and well-nourished. No distress.  HENT:  Head: Normocephalic and atraumatic.  Right Ear: Hearing normal.  Left Ear: Hearing normal.  Nose: Nose normal.  Mouth/Throat: Oropharynx is clear and moist and mucous membranes are normal.  Eyes: Pupils are equal, round, and reactive to light. Conjunctivae and EOM are normal.  Neck: Normal range of motion. Neck supple.  Cardiovascular: Regular rhythm, S1 normal and S2 normal. Exam reveals no gallop and no friction rub.  No murmur heard. Pulmonary/Chest: Effort normal and breath sounds normal. No respiratory distress. He exhibits no tenderness.  Abdominal: Soft. Normal appearance and bowel sounds are normal. There is no hepatosplenomegaly. There is no tenderness. There is no rebound,  no guarding, no tenderness at McBurney's point and negative Murphy's sign. No hernia.  Musculoskeletal: Normal range of motion.       Right hand: He exhibits tenderness. He exhibits normal range of motion, normal capillary refill, no deformity, no laceration and no swelling.       Hands: Neurological: He is alert and oriented to person, place, and time. He has normal strength. No cranial nerve deficit or sensory deficit. Coordination normal. GCS eye subscore is 4. GCS verbal subscore is 5. GCS motor subscore is 6.  Skin: Skin is  warm, dry and intact. No rash noted. No cyanosis.  Psychiatric: His speech is delayed and tangential. He is slowed and withdrawn. He exhibits a depressed mood.  Nursing note and vitals reviewed.    ED Treatments / Results  Labs (all labs ordered are listed, but only abnormal results are displayed) Labs Reviewed  CBC WITH DIFFERENTIAL/PLATELET  COMPREHENSIVE METABOLIC PANEL  ETHANOL  RAPID URINE DRUG SCREEN, HOSP PERFORMED    EKG None  Radiology No results found.  Procedures Procedures (including critical care time)  Medications Ordered in ED Medications - No data to display   Initial Impression / Assessment and Plan / ED Course  I have reviewed the triage vital signs and the nursing notes.  Pertinent labs & imaging results that were available during my care of the patient were reviewed by me and considered in my medical decision making (see chart for details).     Patient presents asking for psychiatric evaluation.  He appears somewhat disorganized and has difficulty answering questions appropriately.  He intermittently says he is suicidal and then not suicidal.  I do not feel that he requires involuntary commitment at this time, but will have psychiatric evaluation.  He appears to be fairly well-known.  Final Clinical Impressions(s) / ED Diagnoses   Final diagnoses:  Suicidal ideation    ED Discharge Orders    None       Pollina, Canary Brim, MD 05/31/17 [phone]

## 2017-05-30 NOTE — BH Assessment (Signed)
[facility] Assessment Progress Note  Per Juanetta Beets, DO, this pt does not require psychiatric hospitalization at this time.  Pt is to be discharged from [facility] with recommendation to follow up with [facility].  This has been included in pt's discharge instructions.  Pt would also benefit from seeing Peer Support Specialists; they will be asked to speak to pt.  Pt's nurse, Aram Beecham, has been notified.  Doylene Canning, MA Triage Specialist [phone]

## 2017-05-30 NOTE — BHH Suicide Risk Assessment (Cosign Needed)
Suicide Risk Assessment  Discharge Assessment   [facility] Discharge Suicide Risk Assessment   Principal Problem: <principal problem not specified> Discharge Diagnoses:  Patient Active Problem List   Diagnosis Date Noted  . Cocaine abuse with cocaine-induced mood disorder (HCC) [F14.14] 05/09/2017  . Cocaine use disorder, mild, abuse (HCC) [F14.10] 01/20/2016  . Cannabis use disorder, mild, abuse [F12.10] 01/20/2016  . Chlamydia infection [A74.9] 01/20/2016  . Schizoaffective disorder, bipolar type (HCC) [F25.0] 01/13/2016  . Cocaine abuse (HCC) [F14.10] 09/22/2014    Total Time spent with patient: 30 minutes  Musculoskeletal: Strength & Muscle Tone: within normal limits Gait & Station: normal Patient leans: N/A  Psychiatric Specialty Exam:   Blood pressure 104/68, pulse (!) 112, temperature 98.3 F (36.8 C), temperature source Oral, resp. rate 14, SpO2 98 %.There is no height or weight on file to calculate BMI.  General Appearance: Casual  Eye Contact::  Good  Speech:  Clear and Coherent and Normal Rate409  Volume:  Normal  Mood:  Depressed  Affect:  Congruent and Depressed  Thought Process:  Coherent, Goal Directed and Linear  Orientation:  Full (Time, Place, and Person)  Thought Content:  Logical  Suicidal Thoughts:  No  Homicidal Thoughts:  No  Memory:  Immediate;   Good Recent;   Good Remote;   Fair  Judgement:  Fair  Insight:  Fair  Psychomotor Activity:  Normal  Concentration:  Good  Recall:  Good  Fund of Knowledge:Good  Language: Good  Akathisia:  No  Handed:  Right  AIMS (if indicated):     Assets:  Architect Social Support  Sleep:     Cognition: WNL  ADL's:  Intact   Mental Status Per Nursing Assessment::   On Admission:   Suicdal ideation while using drugs  Demographic Factors:  Male and Low socioeconomic status  Loss Factors: Financial problems/change in socioeconomic status  Historical  Factors: Impulsivity  Risk Reduction Factors:   Sense of responsibility to family  Continued Clinical Symptoms:  Depression:   Comorbid alcohol abuse/dependence Impulsivity Alcohol/Substance Abuse/Dependencies  Cognitive Features That Contribute To Risk:  Closed-mindedness    Suicide Risk:  Minimal: No identifiable suicidal ideation.  Patients presenting with no risk factors but with morbid ruminations; may be classified as minimal risk based on the severity of the depressive symptoms    Plan Of Care/Follow-up recommendations:  Activity:  as tolerated  Diet:  Heart Healthy  Laveda Abbe, NP 05/30/2017, 12:13 PM

## 2017-05-30 NOTE — Discharge Instructions (Signed)
For your behavioral health needs you are advised to follow up with [facility].  New patients are seen at their walk-in clinic.  Walk-in hours are Monday - Friday from 8:00 am - 12:00 pm, and from 1:00 pm - 3:00 pm.  Walk-in patients are seen on a first come, first served basis, so try to arrive as early as possible for the best chance of being seen the same day.  There is an initial fee of $22.50: ° °     [facility] °     [address] °     [place], [place] [address] °     [phone] °

## 2017-05-30 NOTE — Patient Outreach (Signed)
CPSS met with Pt and had to continuously prompt Pt to answer questions CPSS was made aware that he wanted to go to [facility] [place] [facility]. CPSS contacted [facility] and was made aware that Pt was a no the last no 2 times Pt was to get into the facility. CPSS continued to prompt Pt to wake up and speak with CPSS.Marland Kitchen CPSS left contact information for Pt to stay in touch with CPSS.

## 2017-05-30 NOTE — ED Notes (Signed)
Patient refused to sign out 

## 2017-05-30 NOTE — ED Notes (Signed)
Bed: WLPT4 Expected date:  Expected time:  Means of arrival:  Comments: 

## 2017-06-10 ENCOUNTER — Other Ambulatory Visit: Payer: Self-pay

## 2017-06-10 ENCOUNTER — Emergency Department (HOSPITAL_COMMUNITY)
Admission: EM | Admit: 2017-06-10 | Discharge: 2017-06-10 | Disposition: A | Discharge status: Home / Self Care | Payer: Medicaid Other | Attending: Emergency Medicine | Admitting: Emergency Medicine

## 2017-06-10 ENCOUNTER — Encounter (HOSPITAL_COMMUNITY): Payer: Self-pay

## 2017-06-10 DIAGNOSIS — R21 Rash and other nonspecific skin eruption: Secondary | ICD-10-CM | POA: Diagnosis present

## 2017-06-10 DIAGNOSIS — Z5321 Procedure and treatment not carried out due to patient leaving prior to being seen by health care provider: Secondary | ICD-10-CM | POA: Insufficient documentation

## 2017-06-10 NOTE — ED Notes (Signed)
Bed: WTR7 Expected date:  Expected time:  Means of arrival:  Comments: 

## 2017-06-10 NOTE — ED Triage Notes (Signed)
He tells me he thinks he has scabies. He cites recently sleeping in a hotel. He is manic in appearance and behavior. He admits to smoking, drinking and using cocaine.

## 2017-12-27 ENCOUNTER — Inpatient Hospital Stay (HOSPITAL_COMMUNITY)
Admission: RE | Admit: 2017-12-27 | Discharge: 2018-01-02 | DRG: 885 | Payer: Medicaid Other | Attending: Psychiatry | Admitting: Psychiatry

## 2017-12-27 ENCOUNTER — Other Ambulatory Visit: Payer: Self-pay

## 2017-12-27 ENCOUNTER — Other Ambulatory Visit: Payer: Self-pay | Admitting: Registered Nurse

## 2017-12-27 ENCOUNTER — Encounter (HOSPITAL_COMMUNITY): Payer: Self-pay | Admitting: *Deleted

## 2017-12-27 DIAGNOSIS — F250 Schizoaffective disorder, bipolar type: Principal | ICD-10-CM | POA: Diagnosis present

## 2017-12-27 DIAGNOSIS — G8929 Other chronic pain: Secondary | ICD-10-CM | POA: Diagnosis present

## 2017-12-27 DIAGNOSIS — R45851 Suicidal ideations: Secondary | ICD-10-CM | POA: Diagnosis present

## 2017-12-27 DIAGNOSIS — G4700 Insomnia, unspecified: Secondary | ICD-10-CM | POA: Diagnosis present

## 2017-12-27 DIAGNOSIS — Z9114 Patient's other noncompliance with medication regimen: Secondary | ICD-10-CM | POA: Diagnosis not present

## 2017-12-27 DIAGNOSIS — F1420 Cocaine dependence, uncomplicated: Secondary | ICD-10-CM | POA: Diagnosis present

## 2017-12-27 DIAGNOSIS — F419 Anxiety disorder, unspecified: Secondary | ICD-10-CM | POA: Diagnosis present

## 2017-12-27 DIAGNOSIS — F17210 Nicotine dependence, cigarettes, uncomplicated: Secondary | ICD-10-CM | POA: Diagnosis present

## 2017-12-27 DIAGNOSIS — K219 Gastro-esophageal reflux disease without esophagitis: Secondary | ICD-10-CM | POA: Diagnosis present

## 2017-12-27 DIAGNOSIS — Z880 Allergy status to penicillin: Secondary | ICD-10-CM | POA: Diagnosis not present

## 2017-12-27 DIAGNOSIS — Z590 Homelessness: Secondary | ICD-10-CM | POA: Diagnosis not present

## 2017-12-27 DIAGNOSIS — F259 Schizoaffective disorder, unspecified: Secondary | ICD-10-CM | POA: Diagnosis present

## 2017-12-27 DIAGNOSIS — F1414 Cocaine abuse with cocaine-induced mood disorder: Secondary | ICD-10-CM | POA: Diagnosis present

## 2017-12-27 MED ORDER — QUETIAPINE FUMARATE 100 MG PO TABS
100.0000 mg | ORAL_TABLET | Freq: Every day | ORAL | Status: DC
  Administered 2017-12-27 – 2017-12-28 (×2): 100 mg via ORAL
  Filled 2017-12-27 (×4): qty 1

## 2017-12-27 MED ORDER — NICOTINE 21 MG/24HR TD PT24
21.0000 mg | MEDICATED_PATCH | Freq: Every day | TRANSDERMAL | Status: DC
  Administered 2017-12-27 – 2017-12-31 (×5): 21 mg via TRANSDERMAL
  Filled 2017-12-27 (×7): qty 1

## 2017-12-27 MED ORDER — OLANZAPINE 5 MG PO TBDP
5.0000 mg | ORAL_TABLET | Freq: Every day | ORAL | Status: DC
  Administered 2017-12-27: 5 mg via ORAL
  Filled 2017-12-27 (×3): qty 1

## 2017-12-27 MED ORDER — BENZTROPINE MESYLATE 1 MG PO TABS
1.0000 mg | ORAL_TABLET | Freq: Every day | ORAL | Status: DC
  Filled 2017-12-27 (×3): qty 1
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
## 2017-12-27 NOTE — ED Notes (Addendum)
Shuvon Rankin, NP, patient meets inpatient criteria. Julieanne Cottonina, AC, [facility]. Accepting- Shuvon Rankin, NP and Attending- Dr. Jola Babinskilary.

## 2017-12-27 NOTE — H&P (Signed)
Behavioral Health Medical Screening Exam  Howard Carlson is an 27 y.o. male patient [facility] with complaints of suicidal ideation multiple plans and unable to contract for safety.  Patient also has complaints of auditory/visual hallucinations.  Patient states that [name] is in the room with him checking to make sure he is Molli KnockOkay "[name] is the one who told me to come here."  Patient states that he has a history of schizoaffective disorder and has been off of his medication for 2 years.  Patient also admits to using meth and cocaine; last use 2 days ago.   Total Time spent with patient: 30 minutes  Psychiatric Specialty Exam: Physical Exam  Vitals reviewed. Constitutional: He is oriented to person, place, and time. He appears well-developed and well-nourished. No distress.  HENT:  Head: Normocephalic.  Neck: Normal range of motion. Neck supple.  Respiratory: Effort normal.  Musculoskeletal: Normal range of motion.  Neurological: He is alert and oriented to person, place, and time.  Skin: Skin is warm and dry.  Psychiatric: He is actively hallucinating. Cognition and memory are impaired. He expresses impulsivity. He exhibits a depressed mood. He expresses suicidal ideation.    Review of Systems  Psychiatric/Behavioral: Positive for depression, hallucinations, substance abuse (Meth and cocaine) and suicidal ideas. The patient is nervous/anxious.   All other systems reviewed and are negative.   Blood pressure (!) 115/92, pulse 100, temperature 99.1 F (37.3 C), resp. rate 16, SpO2 100 %.There is no height or weight on file to calculate BMI.  General Appearance: Casual  Eye Contact:  Good  Speech:  Clear and Coherent and Normal Rate  Volume:  Normal  Mood:  Anxious and Depressed  Affect:  Labile  Thought Process:  Disorganized  Orientation:  Full (Time, Place, and Person)  Thought Content:  Hallucinations: Auditory Visual  Suicidal Thoughts:  Yes.  with intent/plan   Homicidal Thoughts:  No  Memory:  Immediate;   Fair Recent;   Fair Remote;   Fair  Judgement:  Impaired  Insight:  Fair  Psychomotor Activity:  Normal  Concentration: Concentration: Fair and Attention Span: Fair  Recall:  FiservFair  Fund of Knowledge:Fair  Language: Good  Akathisia:  No  Handed:  Right  AIMS (if indicated):     Assets:  Communication Skills Desire for Improvement Housing  Sleep:       Musculoskeletal: Strength & Muscle Tone: within normal limits Gait & Station: normal Patient leans: N/A  Blood pressure (!) 115/92, pulse 100, temperature 99.1 F (37.3 C), resp. rate 16, SpO2 100 %.  Recommendations:  Inpatient psychiatric treatment  Based on my evaluation the patient does not appear to have an emergency medical condition.  Shuvon Rankin, NP 12/27/2017, 6:51 PM

## 2017-12-27 NOTE — Progress Notes (Signed)
Howard Carlson is a 27 year old male pt admitted on voluntary basis after presenting as a walk-in. On admission, he appears to be responding to internal stimuli and reports that he has a good personality "[name]" and a bad personality "[name]" and he spoke about how "[name]" convinced him to come in and to get help. He reports that he has been abusing various substances including alcohol, cocaine and meth. He reports that he is supposed to be on medications but reports that he does not take them and reports that he doesn't take them because "they are poison". He does endorse SI but is able to contract for safety while in the hospital. He reports that he is currently homeless and does have an ex wife and children. He reports that he is unsure of where he will go once he is discharged. He was escorted to the unit, oriented to the milieu and safety maintained.

## 2017-12-27 NOTE — Tx Team (Signed)
Initial Treatment Plan 12/27/2017 8:14 PM Cleatus Senaida OresRichardson NWG:[id number]    PATIENT STRESSORS: Financial difficulties Marital or family conflict Medication change or noncompliance Substance abuse   PATIENT STRENGTHS: Ability for insight Average or above average intelligence General fund of knowledge Motivation for treatment/growth   PATIENT IDENTIFIED PROBLEMS: Depression Suicidal thoughts Substance Abuse "Leave the drugs alone" "Get myself together"                     DISCHARGE CRITERIA:  Ability to meet basic life and health needs Improved stabilization in mood, thinking, and/or behavior Reduction of life-threatening or endangering symptoms to within safe limits Verbal commitment to aftercare and medication compliance  PRELIMINARY DISCHARGE PLAN: Attend aftercare/continuing care group  PATIENT/FAMILY INVOLVEMENT: This treatment plan has been presented to and reviewed with the patient, Howard PhillipsDeontae Roethler, and/or family member, .  The patient and family have been given the opportunity to ask questions and make suggestions.  Abdulhamid Olgin, [place], [place] 12/27/2017, 8:14 PM

## 2017-12-28 DIAGNOSIS — R45851 Suicidal ideations: Secondary | ICD-10-CM

## 2017-12-28 DIAGNOSIS — F259 Schizoaffective disorder, unspecified: Secondary | ICD-10-CM

## 2017-12-28 LAB — URINALYSIS, COMPLETE (UACMP) WITH MICROSCOPIC
Bacteria, UA: NONE SEEN
Bilirubin Urine: NEGATIVE
Glucose, UA: NEGATIVE mg/dL
Hgb urine dipstick: NEGATIVE
Ketones, ur: NEGATIVE mg/dL
Nitrite: NEGATIVE
Protein, ur: NEGATIVE mg/dL
Specific Gravity, Urine: 1.032 — ABNORMAL HIGH (ref 1.005–1.030)
pH: 5 (ref 5.0–8.0)

## 2017-12-28 LAB — RAPID URINE DRUG SCREEN, HOSP PERFORMED
Amphetamines: NOT DETECTED
BENZODIAZEPINES: NOT DETECTED
Barbiturates: NOT DETECTED
Cocaine: POSITIVE — AB
Opiates: NOT DETECTED
Tetrahydrocannabinol: NOT DETECTED

## 2017-12-28 MED ORDER — ENSURE ENLIVE PO LIQD
237.0000 mL | Freq: Two times a day (BID) | ORAL | Status: DC
  Administered 2017-12-29 – 2018-01-01 (×4): 237 mL via ORAL

## 2017-12-28 MED ORDER — GABAPENTIN 100 MG PO CAPS
100.0000 mg | ORAL_CAPSULE | Freq: Three times a day (TID) | ORAL | Status: DC
  Administered 2017-12-28 – 2018-01-02 (×13): 100 mg via ORAL
  Filled 2017-12-28 (×6): qty 1
  Filled 2017-12-28 (×2): qty 24
  Filled 2017-12-28: qty 1
  Filled 2017-12-28: qty 24
  Filled 2017-12-28: qty 1
  Filled 2017-12-28: qty 24
  Filled 2017-12-28 (×2): qty 1
  Filled 2017-12-28: qty 24
  Filled 2017-12-28 (×4): qty 1
  Filled 2017-12-28: qty 24
  Filled 2017-12-28 (×2): qty 1

## 2017-12-28 MED ORDER — IBUPROFEN 600 MG PO TABS
600.0000 mg | ORAL_TABLET | Freq: Four times a day (QID) | ORAL | Status: DC | PRN
  Administered 2017-12-28 – 2017-12-31 (×5): 600 mg via ORAL
  Filled 2017-12-28 (×5): qty 1

## 2017-12-28 NOTE — Progress Notes (Signed)
Writer went to pt's room accompanied by MHT as she had helped with his admission to give pt his scheduled Zyprexa Zydis.  She said that pt told the admitting nurse that he had a good personality and a bad personality, and was not sure he would take the med if the bad personality was in control.  MHT was helpful with talking to the patient and he did take the medication from writer after some encouragement.  Pt came to med window later stating that he needed something for sleep.  Writer spoke to the evening provider and received medication to help pt sleep.  At this time, pt is in bed with eyes closed.  No distress observed.  Pt appears to be resting comfortably.  Safety maintained with q15 minute checks.

## 2017-12-28 NOTE — Tx Team (Signed)
Interdisciplinary Treatment and Diagnostic Plan Update  12/29/2017 Time of Session: 0830AM Howard Carlson MRN: [id number]  Principal Diagnosis:  Schizoaffective Disorder  Secondary Diagnoses: Active Problems:   Schizoaffective disorder (HCC)   Current Medications:  Current Facility-Administered Medications  Medication Dose Route Frequency Provider Last Rate Last Dose  . feeding supplement (ENSURE ENLIVE) (ENSURE ENLIVE) liquid 237 mL  237 mL Oral BID BM Sharma Covert, MD   237 mL at 12/29/17 0934  . gabapentin (NEURONTIN) capsule 100 mg  100 mg Oral TID Sharma Covert, MD   100 mg at 12/29/17 0934  . ibuprofen (ADVIL,MOTRIN) tablet 600 mg  600 mg Oral Q6H PRN Lindell Spar I, NP   600 mg at 12/29/17 0937  . nicotine (NICODERM CQ - dosed in mg/24 hours) patch 21 mg  21 mg Transdermal Daily Sharma Covert, MD   21 mg at 12/29/17 0935  . risperiDONE (RISPERDAL) tablet 0.5 mg  0.5 mg Oral Daily Sharma Covert, MD   0.5 mg at 12/29/17 0934  . risperiDONE (RISPERDAL) tablet 1 mg  1 mg Oral QHS Mallie Darting Cordie Grice, MD       PTA Medications: No medications prior to admission.    Patient Stressors: Financial difficulties Marital or family conflict Medication change or noncompliance Substance abuse  Patient Strengths: Ability for insight Average or above average intelligence General fund of knowledge Motivation for treatment/growth  Treatment Modalities: Medication Management, Group therapy, Case management,  1 to 1 session with clinician, Psychoeducation, Recreational therapy.   Physician Treatment Plan for Primary Diagnosis: Schizoaffective Disorder Long Term Goal(s): Improvement in symptoms so as ready for discharge Improvement in symptoms so as ready for discharge   Short Term Goals: Ability to identify changes in lifestyle to reduce recurrence of condition will improve Ability to verbalize feelings will improve Ability to disclose and discuss suicidal  ideas Ability to demonstrate self-control will improve Ability to identify and develop effective coping behaviors will improve Ability to maintain clinical measurements within normal limits will improve Compliance with prescribed medications will improve Ability to identify triggers associated with substance abuse/mental health issues will improve Ability to identify changes in lifestyle to reduce recurrence of condition will improve Ability to verbalize feelings will improve Ability to disclose and discuss suicidal ideas Ability to demonstrate self-control will improve Ability to identify and develop effective coping behaviors will improve Ability to maintain clinical measurements within normal limits will improve Compliance with prescribed medications will improve Ability to identify triggers associated with substance abuse/mental health issues will improve  Medication Management: Evaluate patient's response, side effects, and tolerance of medication regimen.  Therapeutic Interventions: 1 to 1 sessions, Unit Group sessions and Medication administration.  Evaluation of Outcomes: Not Met  Physician Treatment Plan for Secondary Diagnosis: Active Problems:   Schizoaffective disorder ([place])  Long Term Goal(s): Improvement in symptoms so as ready for discharge Improvement in symptoms so as ready for discharge   Short Term Goals: Ability to identify changes in lifestyle to reduce recurrence of condition will improve Ability to verbalize feelings will improve Ability to disclose and discuss suicidal ideas Ability to demonstrate self-control will improve Ability to identify and develop effective coping behaviors will improve Ability to maintain clinical measurements within normal limits will improve Compliance with prescribed medications will improve Ability to identify triggers associated with substance abuse/mental health issues will improve Ability to identify changes in lifestyle to  reduce recurrence of condition will improve Ability to verbalize feelings will improve Ability to disclose and  discuss suicidal ideas Ability to demonstrate self-control will improve Ability to identify and develop effective coping behaviors will improve Ability to maintain clinical measurements within normal limits will improve Compliance with prescribed medications will improve Ability to identify triggers associated with substance abuse/mental health issues will improve     Medication Management: Evaluate patient's response, side effects, and tolerance of medication regimen.  Therapeutic Interventions: 1 to 1 sessions, Unit Group sessions and Medication administration.  Evaluation of Outcomes: Not Met   RN Treatment Plan for Primary Diagnosis: Schizoaffective Disorder Long Term Goal(s): Knowledge of disease and therapeutic regimen to maintain health will improve  Short Term Goals: Ability to remain free from injury will improve, Ability to verbalize frustration and anger appropriately will improve, Ability to demonstrate self-control, Ability to participate in decision making will improve and Ability to disclose and discuss suicidal ideas  Medication Management: RN will administer medications as ordered by provider, will assess and evaluate patient's response and provide education to patient for prescribed medication. RN will report any adverse and/or side effects to prescribing provider.  Therapeutic Interventions: 1 on 1 counseling sessions, Psychoeducation, Medication administration, Evaluate responses to treatment, Monitor vital signs and CBGs as ordered, Perform/monitor CIWA, COWS, AIMS and Fall Risk screenings as ordered, Perform wound care treatments as ordered.  Evaluation of Outcomes: Not Met   LCSW Treatment Plan for Primary Diagnosis: Schizoaffective Disorder Long Term Goal(s): Safe transition to appropriate next level of care at discharge, Engage patient in therapeutic  group addressing interpersonal concerns.  Short Term Goals: Engage patient in aftercare planning with referrals and resources, Increase emotional regulation, Facilitate patient progression through stages of change regarding substance use diagnoses and concerns and Identify triggers associated with mental health/substance abuse issues  Therapeutic Interventions: Assess for all discharge needs, 1 to 1 time with Social worker, Explore available resources and support systems, Assess for adequacy in community support network, Educate family and significant other(s) on suicide prevention, Complete Psychosocial Assessment, Interpersonal group therapy.  Evaluation of Outcomes: Not Met   Progress in Treatment: Attending groups: No. Pt remains isolative in room. Continuing to assess  Participating in groups: No. Taking medication as prescribed: Yes. Toleration medication: Yes. Family/Significant other contact made: SPE completed with pt; pt declined to consent to collateral contact.  Patient understands diagnosis: Yes. Discussing patient identified problems/goals with staff: Yes. Medical problems stabilized or resolved: Yes. Denies suicidal/homicidal ideation: Yes. Issues/concerns per patient self-inventory: No. Other: n/a   New problem(s) identified: No, Describe:  n/a  New Short Term/Long Term Goal(s): detox, elimination of AVH, medication management for mood stabilization; elimination of SI thoughts; development of comprehensive mental wellness/sobriety plan.   Patient Goals:  "I need to get myself together and stop using drugs."   Discharge Plan or Barriers: CSW assessing for appropriate referrals. Follow-up made with [facility] for outpatient mental health services. CSW exploring residential/housing options with patient, although, at this time pt is minimally interested in participating in disposition planning. [place] pamphlet, Mobile Crisis information, and AA/NA information provided to patient  for additional community support and resources.   Reason for Continuation of Hospitalization: Anxiety Depression Hallucinations Medication stabilization Suicidal ideation Withdrawal symptoms  Estimated Length of Stay: Monday, 01/01/18  Attendees: Patient: 12/29/2017 11:08 AM  Physician: Dr. Mallie Darting MD 12/29/2017 11:08 AM  Nursing: Sharl Ma RN; Elberta Fortis RN 12/29/2017 11:08 AM  RN Care Manager:X 12/29/2017 11:08 AM  Social Worker:Dasja Brase LCSW  12/29/2017 11:08 AM  Recreational Therapist: x 12/29/2017 11:08 AM  Other: Lindell Spar NP 12/29/2017 11:08  AM  Other:  12/29/2017 11:08 AM  Other: 12/29/2017 11:08 AM    Scribe for Treatment Team: Avelina Laine, LCSW 12/29/2017 11:08 AM

## 2017-12-28 NOTE — Progress Notes (Signed)
[facility] Group Notes:  (Nursing/MHT/Case Management/Adjunct)  Date:  12/28/2017  Time:  2045 Type of Therapy:  wrap up group  Participation Level:  Active  Participation Quality:  Appropriate, Attentive, Redirectable, Resistant and Sharing  Affect:  Appropriate  Cognitive:  Appropriate  Insight:  Improving  Engagement in Group:  Engaged  Modes of Intervention:  Clarification, Education and Support  Summary of Progress/Problems: Pt reports his good for the day being he ate a lot of food. Pt would like to be started back on Psych medication they had him on last time. If pt could change any one thing about his life it would be to not hear voices.  Pt is grateful for his 4 children.   [name], Reegan Mctighe S 12/28/2017, 10:59 PM

## 2017-12-28 NOTE — [facility] Suicide Risk Assessment (Signed)
[facility] INPATIENT:  Family/Significant Other Suicide Prevention Education  Suicide Prevention Education:  Patient Refusal for Family/Significant Other Suicide Prevention Education: The patient Howard Carlson has refused to provide written consent for family/significant other to be provided Family/Significant Other Suicide Prevention Education during admission and/or prior to discharge.  Physician notified.  SPE completed with pt, as pt refused to consent to family contact. [facility] pamphlet provided to pt and pt was encouraged to share information with support network, ask questions, and talk about any concerns relating to SPE. Pt denies access to guns/firearms and verbalized understanding of information provided. Mobile Crisis information also provided to pt.   Rona RavensHeather S Tanairy Payeur LCSW 12/28/2017, 10:26 AM
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
## 2017-12-28 NOTE — H&P (Signed)
Psychiatric Admission Assessment Adult  Patient Identification: Howard PhillipsDeontae Kight MRN:  [id number] Date of Evaluation:  12/28/2017 Chief Complaint:  Suicidal Principal Diagnosis: <principal problem not specified> Diagnosis:  Active Problems:   Schizoaffective disorder (HCC)  History of Present Illness: Patient is seen and examined.  Patient is a 27 year old male with a reported past psychiatric history of schizoaffective disorder who presented as a walk-in patient to the behavioral health hospital.  He reported suicidal ideation.  He reported multiple plans.  He stated he would jump off a bridge, cut his wrist and bleed to death, or several different other methods.  "I had one really good one to hang myself underneath the bridge so no one would be able to find me".  He stated he had become acutely suicidal 4 days prior to admission.  He also has a history of polysubstance abuse issues.  He has been admitted to the psychiatric hospital on multiple occasions.  Chart review revealed his last psychiatric admission at our facility was on 05/10/2017.  The admission criteria at that time was essentially the same as it is now.  At that time he stated that he plan to jump off from a bridge but stopped himself from jumping.  He was discharged on gabapentin, naltrexone, Seroquel.  This morning he refused to get out of bed to discuss any of his issues.  Associated Signs/Symptoms: Depression Symptoms:  suicidal thoughts without plan, (Hypo) Manic Symptoms:  Impulsivity, Irritable Mood, Anxiety Symptoms:  Excessive Worry, Psychotic Symptoms:  Denied PTSD Symptoms: Negative Total Time spent with patient: 15 minutes  Past Psychiatric History: Patient has multiple psychiatric admissions to our facility.  Most of these are related to substance issues.  His last psychiatric hospitalization at our facility was on 05/10/2017.  He was discharged on gabapentin, naltrexone and Seroquel at that time.  He has been  previously treated with multiple other medications.  Is the patient at risk to self? Yes.    Has the patient been a risk to self in the past 6 months? Yes.    Has the patient been a risk to self within the distant past? Yes.    Is the patient a risk to others? No.  Has the patient been a risk to others in the past 6 months? No.  Has the patient been a risk to others within the distant past? No.   Prior Inpatient Therapy: Prior [id number] Prior Therapy Dates: (none) Prior Therapy Facilty/Provider(s): (none) Reason for Treatment: (none) Prior Outpatient Therapy: Prior Outpatient Therapy: No Does patient have an ACCT team?: No Does patient have Intensive In-House Services?  : No Does patient have [facility] services? : No Does patient have P4[facility] services?: No  Alcohol Screening: 1. How often do you have a drink containing alcohol?: 2 to 3 times a week 2. How many drinks containing alcohol do you have on a typical day when you are drinking?: 1 or 2 3. How often do you have six or more drinks on one occasion?: Monthly AUDIT-C Score: 5 4. How often during the last year have you found that you were not able to stop drinking once you had started?: Never 5. How often during the last year have you failed to do what was normally expected from you becasue of drinking?: Never 6. How often during the last year have you needed a first drink in the morning to get yourself going after a heavy drinking session?: Never 7. How often during the last year have you  had a feeling of guilt of remorse after drinking?: Never 8. How often during the last year have you been unable to remember what happened the night before because you had been drinking?: Never 9. Have you or someone else been injured as a result of your drinking?: No 10. Has a relative or friend or a doctor or another health worker been concerned about your drinking or suggested you cut down?: No Alcohol Use Disorder  Identification Test Final Score (AUDIT): 5 Intervention/Follow-up: AUDIT Score <7 follow-up not indicated Substance Abuse History in the last 12 months:  Yes.   Consequences of Substance Abuse: Family Consequences:  Homelessness, unemployment Previous Psychotropic Medications: Yes  Psychological Evaluations: Yes  Past Medical History:  Past Medical History:  Diagnosis Date  . Anxiety   . Bipolar affective (HCC)   . Schizophrenic disorder (HCC)    History reviewed. No pertinent surgical history. Family History: History reviewed. No pertinent family history. Family Psychiatric  History: No reported history of significant psychiatric illness. Tobacco Screening: Have you used any form of tobacco in the last 30 days? (Cigarettes, Smokeless Tobacco, Cigars, and/or Pipes): Yes Tobacco use, Select all that apply: 5 or more cigarettes per day Are you interested in Tobacco Cessation Medications?: Yes, will notify MD for an order Counseled patient on smoking cessation including recognizing danger situations, developing coping skills and basic information about quitting provided: Refused/Declined practical counseling Social History:  Social History   Substance and Sexual Activity  Alcohol Use Yes     Social History   Substance and Sexual Activity  Drug Use Yes  . Types: Marijuana, "Crack" cocaine, Cocaine   Comment: daily use recently    Additional Social History: Marital status: Single    Pain Medications: see MAR Prescriptions: see MAR Over the Counter: see MAR                    Allergies:   Allergies  Allergen Reactions  . Amoxicillin Anaphylaxis and Other (See Comments)    Has patient had a PCN reaction causing immediate rash, facial/tongue/throat swelling, SOB or lightheadedness with hypotension: Yes Has patient had a PCN reaction causing severe rash involving mucus membranes or skin necrosis: No Has patient had a PCN reaction that required hospitalization No Has  patient had a PCN reaction occurring within the last 10 years: No If all of the above answers are "NO", then may proceed with Cephalosporin use.    Lab Results: No results found for this or any previous visit (from the past 48 hour(s)).  Blood Alcohol level:  Lab Results  Component Value Date   ETH <10 05/30/2017   ETH <10 05/08/2017    Metabolic Disorder Labs:  Lab Results  Component Value Date   HGBA1C 5.6 01/15/2016   MPG 114 01/15/2016   Lab Results  Component Value Date   PROLACTIN 33.1 (H) 01/15/2016   Lab Results  Component Value Date   CHOL 143 01/15/2016   TRIG 91 01/15/2016   HDL 47 01/15/2016   CHOLHDL 3.0 01/15/2016   VLDL 18 01/15/2016   LDLCALC 78 01/15/2016    Current Medications: Current Facility-Administered Medications  Medication Dose Route Frequency Provider Last Rate Last Dose  . feeding supplement (ENSURE ENLIVE) (ENSURE ENLIVE) liquid 237 mL  237 mL Oral BID BM Antonieta Pert, MD      . gabapentin (NEURONTIN) capsule 100 mg  100 mg Oral TID Antonieta Pert, MD   100 mg at 12/28/17 1157  .  ibuprofen (ADVIL,MOTRIN) tablet 600 mg  600 mg Oral Q6H PRN Nwoko, Agnes I, NP      . nicotine (NICODERM CQ - dosed in mg/24 hours) patch 21 mg  21 mg Transdermal Daily Antonieta Pert, MD   21 mg at 12/28/17 1157  . QUEtiapine (SEROQUEL) tablet 100 mg  100 mg Oral QHS Donell Sievert E, PA-C   100 mg at 12/27/17 2230   PTA Medications: No medications prior to admission.    Musculoskeletal: Strength & Muscle Tone: within normal limits Gait & Station: normal Patient leans: N/A  Psychiatric Specialty Exam: Physical Exam  Nursing note and vitals reviewed. Constitutional: He is oriented to person, place, and time. He appears well-developed and well-nourished.  HENT:  Head: Normocephalic and atraumatic.  Respiratory: Effort normal.  Neurological: He is alert and oriented to person, place, and time.    ROS  Blood pressure 134/82, pulse 96,  temperature 99 F (37.2 C), temperature source Oral, resp. rate 18, height 5\' 7"  (1.702 m), weight 71.2 kg, SpO2 100 %.Body mass index is 24.59 kg/m.  General Appearance: Disheveled  Eye Contact:  Poor  Speech:  Slow  Volume:  Normal  Mood:  Dysphoric  Affect:  Blunt  Thought Process:  Coherent and Descriptions of Associations: Circumstantial  Orientation:  Full (Time, Place, and Person)  Thought Content:  Negative  Suicidal Thoughts:  Yes.  without intent/plan  Homicidal Thoughts:  No  Memory:  Immediate;   Poor Recent;   Poor Remote;   Poor  Judgement:  Impaired  Insight:  Lacking  Psychomotor Activity:  Decreased  Concentration:  Concentration: Fair and Attention Span: Fair  Recall:  [employer] of Knowledge:  Fair  Language:  Fair  Akathisia:  Negative  Handed:  Right  AIMS (if indicated):     Assets:  Desire for Improvement Physical Health  ADL's:  Intact  Cognition:  WNL  Sleep:  Number of Hours: 6.5    Treatment Plan Summary: Daily contact with patient to assess and evaluate symptoms and progress in treatment, Medication management and Plan Patient is seen and examined.  Patient is a 73-year-old male with the above-stated past psychiatric history who was admitted secondary to suicidal ideation.  He has a reported history of schizoaffective disorder, but primarily it appears at this point to be more substance related.  He is clearly not invested in his psychiatric treatment.  He will be restarted on his gabapentin, naltrexone and Seroquel.  We will attempt to stabilize the situation and get him plugged into outpatient treatment.  He was a direct admit to the behavioral health hospital, so we do not have drug screen so that her laboratories on him at this point, they will be collected today.  Observation Level/Precautions:  15 minute checks  Laboratory:  Chemistry Profile  Psychotherapy:    Medications:    Consultations:    Discharge Concerns:    Estimated LOS:  Other:      Physician Treatment Plan for Primary Diagnosis: <principal problem not specified> Long Term Goal(s): Improvement in symptoms so as ready for discharge  Short Term Goals: Ability to identify changes in lifestyle to reduce recurrence of condition will improve, Ability to verbalize feelings will improve, Ability to disclose and discuss suicidal ideas, Ability to demonstrate self-control will improve, Ability to identify and develop effective coping behaviors will improve, Ability to maintain clinical measurements within normal limits will improve, Compliance with prescribed medications will improve and Ability to identify triggers associated with  substance abuse/mental health issues will improve  Physician Treatment Plan for Secondary Diagnosis: Active Problems:   Schizoaffective disorder (HCC)  Long Term Goal(s): Improvement in symptoms so as ready for discharge  Short Term Goals: Ability to identify changes in lifestyle to reduce recurrence of condition will improve, Ability to verbalize feelings will improve, Ability to disclose and discuss suicidal ideas, Ability to demonstrate self-control will improve, Ability to identify and develop effective coping behaviors will improve, Ability to maintain clinical measurements within normal limits will improve, Compliance with prescribed medications will improve and Ability to identify triggers associated with substance abuse/mental health issues will improve  I certify that inpatient services furnished can reasonably be expected to improve the patient's condition.    Antonieta Pert, MD 12/5/20191:10 PM

## 2017-12-28 NOTE — [facility] Group Notes (Signed)
Adult Nursing/M[facility] Psychoeducational Group Note  Date:  12/28/2017  Time: 4:00 PM  Group Topic/Focus: Personal Choices and Values The focus of this group is to help patients assess and explore the importance of values in their lives, how their values affect their decisions, how they express their values and what opposes their expression. Lead By: Alyssa J. RN, and Michael L, MHT  Participation Level:  Did Not Attend  Additional Comments:  The patients were provided a beach ball with various questions. Patients took turn tossing the ball and answering questions while engaging in socialization and discussion.   Patient was invited but declined to attend group.  Howard Carlson A Howard Carlson 12/28/2017 5:00 PM 

## 2017-12-28 NOTE — Plan of Care (Signed)
Patient remains safe on the unit.  Problem: Coping: Goal: Ability to demonstrate self-control will improve Outcome: Progressing   Problem: Activity: Goal: Sleeping patterns will improve Outcome: Not Progressing   Problem: Coping: Goal: Ability to verbalize frustrations and anger appropriately will improve Outcome: Not Progressing   Problem: Health Behavior/Discharge Planning: Goal: Identification of resources available to assist in meeting health care needs will improve Outcome: Not Progressing Goal: Compliance with treatment plan for underlying cause of condition will improve Outcome: Not Progressing
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
## 2017-12-28 NOTE — Progress Notes (Signed)
D: Pt was in dayroom upon initial approach.  Pt presents with anxious affect and suspicious mood.  He describes his day as "all right."  His goal is "coping skills and getting back on all my meds."  Pt reports he would like to be back on Risperdal and was encouraged to discuss with provider tomorrow.  He reports he has been on "Risperdal, Seroquel, gapabentin" in the past.  He reports another goal is to "keep a positive atmosphere around me."  He denies SI/HI, reports AH of "voices" that are "demanding at times to harm myself or other people."  Pt denies SI/HI, denies pain.  Pt has been visible in milieu interacting with peers and staff appropriately.  Pt attended evening group.    A: Introduced self to pt.  Actively listened to pt and offered support and encouragement. Medication administered per order.  Q15 minute safety checks maintained.  R: Pt is safe on the unit.  Pt is compliant with medication.  Pt verbally contracts for safety.  Will continue to monitor and assess.

## 2017-12-29 MED ORDER — RISPERIDONE 0.5 MG PO TABS
0.5000 mg | ORAL_TABLET | Freq: Every day | ORAL | Status: DC
  Administered 2017-12-29 – 2018-01-02 (×5): 0.5 mg via ORAL
  Filled 2017-12-29 (×2): qty 1
  Filled 2017-12-29: qty 15
  Filled 2017-12-29 (×2): qty 1
  Filled 2017-12-29: qty 15
  Filled 2017-12-29 (×4): qty 1

## 2017-12-29 MED ORDER — RISPERIDONE 1 MG PO TABS
1.0000 mg | ORAL_TABLET | Freq: Every day | ORAL | Status: DC
  Administered 2017-12-29 – 2017-12-30 (×2): 1 mg via ORAL
  Filled 2017-12-29 (×4): qty 1

## 2017-12-29 MED ORDER — TRAZODONE HCL 50 MG PO TABS
50.0000 mg | ORAL_TABLET | Freq: Every evening | ORAL | Status: DC | PRN
  Administered 2017-12-29 – 2017-12-30 (×2): 50 mg via ORAL
  Filled 2017-12-29 (×2): qty 1

## 2017-12-29 MED ORDER — HYDROXYZINE HCL 50 MG PO TABS
50.0000 mg | ORAL_TABLET | Freq: Four times a day (QID) | ORAL | Status: DC | PRN
  Administered 2017-12-30: 50 mg via ORAL
  Filled 2017-12-29: qty 1
  Filled 2017-12-29: qty 25

## 2017-12-29 MED ORDER — ALUM & MAG HYDROXIDE-SIMETH 200-200-20 MG/5ML PO SUSP
30.0000 mL | ORAL | Status: DC | PRN
  Administered 2017-12-29 – 2018-01-01 (×7): 30 mL via ORAL
  Filled 2017-12-29 (×7): qty 30

## 2017-12-29 NOTE — Progress Notes (Signed)
D: Pt was in bed in his room upon initial approach.  Pt presents with depressed affect and mood.  He forwards little information to Medical laboratory scientific officer.  He describes his day as "good" and reports goal is to "stay alive."  Pt denies SI/HI, denies auditory hallucinations, reports pain from headache of 5/10.  He reports VH of "dark shadows" tonight.  Pt stayed in bed until after group and he was then more visible in milieu interacting with peers.    A: Met with pt 1:1.  Actively listened to pt and offered support and encouragement. Medication administered per order.  PRN medication administered for indigestion, pain, and sleep.  Q15 minute safety checks maintained.  R: Pt is safe on the unit.  Pt is compliant with medications.  Pt verbally contracts for safety.  Will continue to monitor and assess.

## 2017-12-29 NOTE — Progress Notes (Signed)
Pt did not attend AA group this evening.  

## 2017-12-29 NOTE — Progress Notes (Signed)
CSW and MD met with pt this morning to discuss pt progress and aftercare plan. Pt expressed interest in residential treatment and was agreeable to [facility] referral. [facility] appt made for outpatient mental health care. Pt continues to endorse auditory hallucination, denies VH, SI, HI. Pt calm, slightly guarded during interaction.   Zyah Gomm S. Ouida Sills, MSW, LCSW Clinical Social Worker 12/29/2017 11:27 AM

## 2017-12-29 NOTE — Progress Notes (Signed)
[facility] MD Progress Note  12/29/2017 10:55 AM Howard Carlson  MRN:  [id number] Subjective: Patient is seen and examined.  Patient is a 27 year old male with a past psychiatric history significant for schizoaffective disorder, cocaine dependence and alcohol use disorder who presented to the behavioral health hospital with suicidal ideation. Principal Problem: <principal problem not specified> Diagnosis: Active Problems:   Schizoaffective disorder (HCC)  Total Time spent with patient: 15 minutes  Past Psychiatric History: Patient is seen and examined.  Patient is a 27 year old male with the above-stated past psychiatric history seen in follow-up.  He was seen together with social work today.  He discussed the auditory hallucinations that he has had chronically.  He discussed his noncompliance with medications.  He had been living with his wife, but it sounds like he may not be able to return there.  He stated he wanted to switch his medications from Seroquel to risk for doll, and I told him that we would accommodate that.  He denied acute suicidal ideation.  He admitted to having a low mood, and also stated that he had a previous seizure disorder.  He also stated that he was interested in residential substance abuse treatment.  His vital signs are stable, he is afebrile.  He is not showing any signs or symptoms at this point of alcohol withdrawal.  Nursing notes reflect that he slept 6 hours last night.  His laboratory so far have come back with drug screen positive only for cocaine.  A blood alcohol was not done.  Past Medical History:  Past Medical History:  Diagnosis Date  . Anxiety   . Bipolar affective (HCC)   . Schizophrenic disorder (HCC)    History reviewed. No pertinent surgical history. Family History: History reviewed. No pertinent family history. Family Psychiatric  History: See admission H&P Social History:  Social History   Substance and Sexual Activity  Alcohol Use Yes      Social History   Substance and Sexual Activity  Drug Use Yes  . Types: Marijuana, "Crack" cocaine, Cocaine   Comment: daily use recently    Social History   Socioeconomic History  . Marital status: Married    Spouse name: Not on file  . Number of children: Not on file  . Years of education: Not on file  . Highest education level: Not on file  Occupational History  . Not on file  Social Needs  . Financial resource strain: Not on file  . Food insecurity:    Worry: Not on file    Inability: Not on file  . Transportation needs:    Medical: Not on file    Non-medical: Not on file  Tobacco Use  . Smoking status: Current Every Day Smoker    Types: Cigarettes  . Smokeless tobacco: Never Used  Substance and Sexual Activity  . Alcohol use: Yes  . Drug use: Yes    Types: Marijuana, "Crack" cocaine, Cocaine    Comment: daily use recently  . Sexual activity: Yes  Lifestyle  . Physical activity:    Days per week: Not on file    Minutes per session: Not on file  . Stress: Not on file  Relationships  . Social connections:    Talks on phone: Not on file    Gets together: Not on file    Attends religious service: Not on file    Active member of club or organization: Not on file    Attends meetings of clubs or organizations: Not on  file    Relationship status: Not on file  Other Topics Concern  . Not on file  Social History Narrative  . Not on file   Additional Social History:    Pain Medications: see MAR Prescriptions: see MAR Over the Counter: see MAR                    Sleep: Fair  Appetite:  Good  Current Medications: Current Facility-Administered Medications  Medication Dose Route Frequency Provider Last Rate Last Dose  . feeding supplement (ENSURE ENLIVE) (ENSURE ENLIVE) liquid 237 mL  237 mL Oral BID BM Antonieta Pert, MD   237 mL at 12/29/17 0934  . gabapentin (NEURONTIN) capsule 100 mg  100 mg Oral TID Antonieta Pert, MD   100 mg at 12/29/17  0934  . ibuprofen (ADVIL,MOTRIN) tablet 600 mg  600 mg Oral Q6H PRN Armandina Stammer I, NP   600 mg at 12/29/17 0937  . nicotine (NICODERM CQ - dosed in mg/24 hours) patch 21 mg  21 mg Transdermal Daily Antonieta Pert, MD   21 mg at 12/29/17 0935  . risperiDONE (RISPERDAL) tablet 0.5 mg  0.5 mg Oral Daily Antonieta Pert, MD   0.5 mg at 12/29/17 0934  . risperiDONE (RISPERDAL) tablet 1 mg  1 mg Oral QHS Antonieta Pert, MD        Lab Results:  Results for orders placed or performed during the hospital encounter of 12/27/17 (from the past 48 hour(s))  Urinalysis, Complete w Microscopic     Status: Abnormal   Collection Time: 12/28/17  8:00 AM  Result Value Ref Range   Color, Urine YELLOW YELLOW   APPearance CLEAR CLEAR   Specific Gravity, Urine 1.032 (H) 1.005 - 1.030   pH 5.0 5.0 - 8.0   Glucose, UA NEGATIVE NEGATIVE mg/dL   Hgb urine dipstick NEGATIVE NEGATIVE   Bilirubin Urine NEGATIVE NEGATIVE   Ketones, ur NEGATIVE NEGATIVE mg/dL   Protein, ur NEGATIVE NEGATIVE mg/dL   Nitrite NEGATIVE NEGATIVE   Leukocytes, UA SMALL (A) NEGATIVE   RBC / HPF 0-5 0 - 5 RBC/hpf   WBC, UA 11-20 0 - 5 WBC/hpf   Bacteria, UA NONE SEEN NONE SEEN   Squamous Epithelial / LPF 0-5 0 - 5   Mucus PRESENT    Ca Oxalate Crys, UA PRESENT     Comment: Performed at [facility], [address] [place], [place] [address]  Urine rapid drug screen (hosp performed)not at [facility]     Status: Abnormal   Collection Time: 12/28/17  8:00 AM  Result Value Ref Range   Opiates NONE DETECTED NONE DETECTED   Cocaine POSITIVE (A) NONE DETECTED   Benzodiazepines NONE DETECTED NONE DETECTED   Amphetamines NONE DETECTED NONE DETECTED   Tetrahydrocannabinol NONE DETECTED NONE DETECTED   Barbiturates NONE DETECTED NONE DETECTED    Comment: (NOTE) DRUG SCREEN FOR MEDICAL PURPOSES ONLY.  IF CONFIRMATION IS NEEDED FOR ANY PURPOSE, NOTIFY LAB WITHIN 5 DAYS. LOWEST DETECTABLE LIMITS FOR URINE DRUG  SCREEN Drug Class                     Cutoff (ng/mL) Amphetamine and metabolites    1000 Barbiturate and metabolites    200 Benzodiazepine                 200 Tricyclics and metabolites     300 Opiates and metabolites  300 Cocaine and metabolites        300 THC                            50 Performed at [facility], [address], [place], [place] [address]     Blood Alcohol level:  Lab Results  Component Value Date   ETH <10 05/30/2017   ETH <10 05/08/2017    Metabolic Disorder Labs: Lab Results  Component Value Date   HGBA1C 5.6 01/15/2016   MPG 114 01/15/2016   Lab Results  Component Value Date   PROLACTIN 33.1 (H) 01/15/2016   Lab Results  Component Value Date   CHOL 143 01/15/2016   TRIG 91 01/15/2016   HDL 47 01/15/2016   CHOLHDL 3.0 01/15/2016   VLDL 18 01/15/2016   LDLCALC 78 01/15/2016    Physical Findings: AIMS: Facial and Oral Movements Muscles of Facial Expression: None, normal Lips and Perioral Area: None, normal Jaw: None, normal Tongue: None, normal,Extremity Movements Upper (arms, wrists, hands, fingers): None, normal Lower (legs, knees, ankles, toes): None, normal, Trunk Movements Neck, shoulders, hips: None, normal, Overall Severity Severity of abnormal movements (highest score from questions above): None, normal Incapacitation due to abnormal movements: None, normal Patient's awareness of abnormal movements (rate only patient's report): No Awareness, Dental Status Current problems with teeth and/or dentures?: No Does patient usually wear dentures?: No  CIWA:    COWS:     Musculoskeletal: Strength & Muscle Tone: within normal limits Gait & Station: normal Patient leans: N/A  Psychiatric Specialty Exam: Physical Exam  Nursing note and vitals reviewed. Constitutional: He is oriented to person, place, and time. He appears well-developed and well-nourished.  HENT:  Head: Normocephalic and atraumatic.   Respiratory: Effort normal.  Neurological: He is alert and oriented to person, place, and time.    ROS  Blood pressure 112/78, pulse 96, temperature 98.5 F (36.9 C), temperature source Oral, resp. rate 18, height 5\' 7"  (1.702 m), weight 71.2 kg, SpO2 100 %.Body mass index is 24.59 kg/m.  General Appearance: Casual  Eye Contact:  Fair  Speech:  Normal Rate  Volume:  Decreased  Mood:  Anxious and Depressed  Affect:  Congruent  Thought Process:  Coherent and Descriptions of Associations: Intact  Orientation:  Full (Time, Place, and Person)  Thought Content:  Hallucinations: Auditory  Suicidal Thoughts:  Yes.  without intent/plan  Homicidal Thoughts:  No  Memory:  Immediate;   Fair Recent;   Fair Remote;   Fair  Judgement:  Intact  Insight:  Lacking  Psychomotor Activity:  Normal  Concentration:  Concentration: Fair and Attention Span: Fair  Recall:  [employer] of Knowledge:  Fair  Language:  Good  Akathisia:  Negative  Handed:  Right  AIMS (if indicated):     Assets:  Desire for Improvement Housing Leisure Time Physical Health Resilience  ADL's:  Intact  Cognition:  WNL  Sleep:  Number of Hours: 6     Treatment Plan Summary: Daily contact with patient to assess and evaluate symptoms and progress in treatment, Medication management and Plan : Patient is a 27 year old male with the above-stated past psychiatric history who was admitted secondary to suicidal ideation. Patient is more awake and alert today.  He is out of bed.  He is requesting detox from the cocaine, restoration of his medications for schizoaffective disorder, and for residential treatment placement.  He stated his suicidal thoughts  still come and go.  He is also continuing to have auditory hallucinations. 1.  Stop Seroquel 2.  Risperdal 0.5 mg p.o. daily and 1 mg p.o. nightly for hallucinations and mood stability. 3.  Seizure precautions 4.  Gabapentin 100 mg p.o. 3 times daily for chronic pain, mood  stability and anxiety. 5.  Social work will continue to work on residential substance abuse treatment programs 6.  Disposition planning-in progress.  Antonieta PertGreg Lawson , MD 12/29/2017, 10:55 AM

## 2017-12-29 NOTE — Plan of Care (Signed)
  Problem: Coping: Goal: Ability to verbalize frustrations and anger appropriately will improve Outcome: Progressing Goal: Ability to demonstrate self-control will improve Outcome: Progressing   D: Pt alert and oriented on the unit. Pt engaging with RN staff and other pts. Pt denies SI/HI, VH, but endorses AH. Pt's affect was flat and mood depressed on the unit today. Pt was isolative and slept most of the day but did participate during group and unit activities. Pt is pleasant and cooperative. A: Education, support and encouragement provided, q15 minute safety checks remain in effect. Medications administered per MD orders. R: No reactions/side effects to medicine noted. Pt denies any concerns at this time, and verbally contracts for safety. Pt ambulating on the unit with no issues. Pt remains safe on and off the unit.

## 2017-12-29 NOTE — Progress Notes (Signed)
Adult Psychoeducational Group Note  Date:  12/29/2017 Time:  1:13 PM  Group Topic/Focus:  Goals Group:   The focus of this group is to help patients establish daily goals to achieve during treatment and discuss how the patient can incorporate goal setting into their daily lives to aide in recovery.  Participation Level:  Active  Participation Quality:  Appropriate  Affect:  Appropriate  Cognitive:  Appropriate  Insight: Appropriate  Engagement in Group:  Engaged  Modes of Intervention:  Discussion  Additional Comments:  Pt attended morning group.  Deforest Hoyleslexandre J Genova Kiner 12/29/2017, 1:13 PM

## 2017-12-30 DIAGNOSIS — F419 Anxiety disorder, unspecified: Secondary | ICD-10-CM

## 2017-12-30 MED ORDER — METHOCARBAMOL 500 MG PO TABS
500.0000 mg | ORAL_TABLET | Freq: Three times a day (TID) | ORAL | Status: DC | PRN
  Administered 2017-12-30: 500 mg via ORAL
  Filled 2017-12-30: qty 1

## 2017-12-30 MED ORDER — PANTOPRAZOLE SODIUM 20 MG PO TBEC
20.0000 mg | DELAYED_RELEASE_TABLET | Freq: Every day | ORAL | Status: DC
  Administered 2017-12-30 – 2018-01-02 (×4): 20 mg via ORAL
  Filled 2017-12-30 (×2): qty 1
  Filled 2017-12-30 (×2): qty 15
  Filled 2017-12-30 (×3): qty 1

## 2017-12-30 NOTE — Plan of Care (Signed)
  Problem: Safety: Goal: Periods of time without injury will increase Outcome: Progressing Note:  Pt has not harmed self or others tonight.  He denies SI/HI and verbally contracts for safety.   

## 2017-12-30 NOTE — Progress Notes (Signed)
D: Pt was in hallway upon initial approach.  Pt presents with silly affect and depressed mood.  He reports the best part of his day as "group, it helped me get some understanding and feel okay and comfortable."  He reports having "depressed moments" throughout the day.  Pt has been attention-seeking at loud at times tonight, requiring redirection.  Pt denies SI/HI, reports visual hallucinations of "shadows," denies pain.  Pt attended evening group.    A: Met with pt 1:1.  Actively listened to pt and offered support and encouragement. Medication administered per order.  PRN medication administered for sleep and anxiety.  Q15 minute safety checks maintained.  R: Pt is safe on the unit.  Pt is compliant with medications.  Pt verbally contracts for safety.  Will continue to monitor and assess.

## 2017-12-30 NOTE — Progress Notes (Signed)
Pt presents with an animated affect and anxious mood. Pt reports ongoing depression and anxiety this am. Pt denies SI/HI. Pt denies AVH. Pt rates depression 6/10, anxiety 8/10, hopelessness 10. Pt noncompliant this morning with taking medications as scheduled. Pt refused to wake up this morning when asked to attend groups. Pt c/o generalized muscle spasms. Alcario Droughtanika, NP., made aware.   Pt started on Robaxin for muscle spasms. Medications administered as ordered per MD. Verbal support provided. Pt encouraged to attend groups. 15 minute checks performed for safety.   No concerns verbalized by pt.

## 2017-12-30 NOTE — [facility] Group Notes (Signed)
LCSW Group Therapy Note  12/30/2017   10:00-11:00am   Type of Therapy and Topic:  Group Therapy: Anger Cues and Responses  Participation Level:  Did Not Attend   Description of Group:   In this group, patients learned how to recognize the physical, cognitive, emotional, and behavioral responses they have to anger-provoking situations.  They identified a recent time they became angry and how they reacted.  They analyzed how their reaction was possibly beneficial and how it was possibly unhelpful.  The group discussed a variety of healthier coping skills that could help with such a situation in the future.  Deep breathing was practiced briefly.  Therapeutic Goals: 1. Patients will remember their last incident of anger and how they felt emotionally and physically, what their thoughts were at the time, and how they behaved. 2. Patients will identify how their behavior at that time worked for them, as well as how it worked against them. 3. Patients will explore possible new behaviors to use in future anger situations. 4. Patients will learn that anger itself is normal and cannot be eliminated, and that healthier reactions can assist with resolving conflict rather than worsening situations.  Summary of Patient Progress:  Did not attend Therapeutic Modalities:   Cognitive Behavioral Therapy  Shavanna Furnari D Sheldon Sem    

## 2017-12-30 NOTE — Progress Notes (Addendum)
[facility]  12/30/2017 3:33 PM Howard PhillipsDeontae Birchmeier  MRN:  [id number]    Evaluation: Howard Carlson resting in day room interacting with peers.  Awake alert and oriented x3.  Reports chronic auditory hallucinations that is unrelieved with medication however reports medication makes symptoms manageable.  Denies that he has been taking any medications for the past year and a half.  Patient reports his cocaine was laced with methamphetamines causing the auditory hallucinations to worsen.  Rates his depression 8 out of 10 with 10 being the worst on this assessment.  Patient presented flat, guarded, hesitant  with responses and with minimal eye contact.  However observed with active and engaged participation with peers.  Denies suicidal or homicidal ideations during this assessment however does indicate he is ":just waking up."  Reports a good appetite.  States he  is resting well.  Patient is requesting muscle relaxant due to plan basketball on yesterday.  Support encouragement reassurance was provided  Charted on admission:Patient is a 27 year old male with a reported past psychiatric history of schizoaffective disorder who presented as a walk-in patient to the behavioral health hospital. He reported suicidal ideation. He reported multiple plans. He stated he would jump off a bridge, cut his wrist and bleed to death, or several different other methods. "I had one really good one to hang myself underneath the bridge so no one would be able to find me". He stated he had become acutely suicidal 4 days prior to admission. He also has a history of polysubstance abuse issues. He has been admitted to the psychiatric hospital on multiple occasions. Chart review revealed his last psychiatric admission at our facility was on 05/10/2017. The admission criteria at that time was essentially the same as it is now. At that time he stated that he plan to jump off from a bridge but stopped himself from jumping. He was  discharged on gabapentin, naltrexone, Seroquel. This morning he refused to get out of bed to discuss any of his issues.   Principal Problem: <principal problem not specified> Diagnosis: Active Problems:   Schizoaffective disorder (HCC)  Total Time spent with patient: 15 minutes    Past Medical History:  Past Medical History:  Diagnosis Date  . Anxiety   . Bipolar affective (HCC)   . Schizophrenic disorder (HCC)    History reviewed. No pertinent surgical history. Family History: History reviewed. No pertinent family history. Family Psychiatric  History: See admission H&P Social History:  Social History   Substance and Sexual Activity  Alcohol Use Yes     Social History   Substance and Sexual Activity  Drug Use Yes  . Types: Marijuana, "Crack" cocaine, Cocaine   Comment: daily use recently    Social History   Socioeconomic History  . Marital status: Married    Spouse name: Not on file  . Number of children: Not on file  . Years of education: Not on file  . Highest education level: Not on file  Occupational History  . Not on file  Social Needs  . Financial resource strain: Not on file  . Food insecurity:    Worry: Not on file    Inability: Not on file  . Transportation needs:    Medical: Not on file    Non-medical: Not on file  Tobacco Use  . Smoking status: Current Every Day Smoker    Types: Cigarettes  . Smokeless tobacco: Never Used  Substance and Sexual Activity  . Alcohol use: Yes  . Drug  use: Yes    Types: Marijuana, "Crack" cocaine, Cocaine    Comment: daily use recently  . Sexual activity: Yes  Lifestyle  . Physical activity:    Days per week: Not on file    Minutes per session: Not on file  . Stress: Not on file  Relationships  . Social connections:    Talks on phone: Not on file    Gets together: Not on file    Attends religious service: Not on file    Active member of club or organization: Not on file    Attends meetings of clubs or  organizations: Not on file    Relationship status: Not on file  Other Topics Concern  . Not on file  Social History Narrative  . Not on file   Additional Social History:    Pain Medications: see MAR Prescriptions: see MAR Over the Counter: see MAR                    Sleep: Fair  Appetite:  Good  Current Medications: Current Facility-Administered Medications  Medication Dose Route Frequency Provider Last Rate Last Dose  . alum & mag hydroxide-simeth (MAALOX/MYLANTA) 200-200-20 MG/5ML suspension 30 mL  30 mL Oral Q4H PRN Antonieta Pert, MD   30 mL at 12/30/17 1147  . feeding supplement (ENSURE ENLIVE) (ENSURE ENLIVE) liquid 237 mL  237 mL Oral BID BM Antonieta Pert, MD   237 mL at 12/30/17 1509  . gabapentin (NEURONTIN) capsule 100 mg  100 mg Oral TID Antonieta Pert, MD   100 mg at 12/30/17 1132  . hydrOXYzine (ATARAX/VISTARIL) tablet 50 mg  50 mg Oral Q6H PRN Kerry Hough, PA-C      . ibuprofen (ADVIL,MOTRIN) tablet 600 mg  600 mg Oral Q6H PRN Armandina Stammer I, NP   600 mg at 12/30/17 1132  . methocarbamol (ROBAXIN) tablet 500 mg  500 mg Oral Q8H PRN Oneta Rack, NP   500 mg at 12/30/17 1508  . nicotine (NICODERM CQ - dosed in mg/24 hours) patch 21 mg  21 mg Transdermal Daily Antonieta Pert, MD   21 mg at 12/30/17 1133  . risperiDONE (RISPERDAL) tablet 0.5 mg  0.5 mg Oral Daily Antonieta Pert, MD   0.5 mg at 12/30/17 1131  . risperiDONE (RISPERDAL) tablet 1 mg  1 mg Oral QHS Antonieta Pert, MD   1 mg at 12/29/17 2211  . traZODone (DESYREL) tablet 50 mg  50 mg Oral QHS PRN,MR X 1 Kerry Hough, PA-C   50 mg at 12/29/17 2259    Lab Results:  No results found for this or any previous visit (from the past 48 hour(s)).  Blood Alcohol level:  Lab Results  Component Value Date   ETH <10 05/30/2017   ETH <10 05/08/2017    Metabolic Disorder Labs: Lab Results  Component Value Date   HGBA1C 5.6 01/15/2016   MPG 114 01/15/2016   Lab  Results  Component Value Date   PROLACTIN 33.1 (H) 01/15/2016   Lab Results  Component Value Date   CHOL 143 01/15/2016   TRIG 91 01/15/2016   HDL 47 01/15/2016   CHOLHDL 3.0 01/15/2016   VLDL 18 01/15/2016   LDLCALC 78 01/15/2016    Physical Findings: AIMS: Facial and Oral Movements Muscles of Facial Expression: None, normal Lips and Perioral Area: None, normal Jaw: None, normal Tongue: None, normal,Extremity Movements Upper (arms, wrists, hands, fingers): None, normal Lower (legs,  knees, ankles, toes): None, normal, Trunk Movements Neck, shoulders, hips: None, normal, Overall Severity Severity of abnormal movements (highest score from questions above): None, normal Incapacitation due to abnormal movements: None, normal Patient's awareness of abnormal movements (rate only patient's report): No Awareness, Dental Status Current problems with teeth and/or dentures?: No Does patient usually wear dentures?: No  CIWA:    COWS:     Musculoskeletal: Strength & Muscle Tone: within normal limits Gait & Station: normal Patient leans: N/A  Psychiatric Specialty Exam: Physical Exam  Nursing note and vitals reviewed. Constitutional: He is oriented to person, place, and time. He appears well-developed and well-nourished.  HENT:  Head: Normocephalic and atraumatic.  Respiratory: Effort normal.  Neurological: He is alert and oriented to person, place, and time.  Psychiatric: He has a normal mood and affect. His behavior is normal.    Review of Systems  Psychiatric/Behavioral: Positive for depression and hallucinations. The patient is nervous/anxious.   All other systems reviewed and are negative.   Blood pressure 123/73, pulse 72, temperature 98.7 F (37.1 C), resp. rate 18, height 5\' 7"  (1.702 m), weight 71.2 kg, SpO2 100 %.Body mass index is 24.59 kg/m.  General Appearance: Casual and Guarded  Eye Contact:  Fair  Speech:  Normal Rate  Volume:  Decreased  Mood:  Anxious and  Depressed  Affect:  Congruent  Thought Process:  Coherent and Descriptions of Associations: Intact  Orientation:  Full (Time, Place, and Person)  Thought Content:  Hallucinations: Auditory denies hallucinations are command in nature  Suicidal Thoughts:  Yes.  without intent/plan  Homicidal Thoughts:  No  Memory:  Immediate;   Fair Recent;   Fair Remote;   Fair  Judgement:  Intact  Insight:  Lacking  Psychomotor Activity:  Normal  Concentration:  Concentration: Fair and Attention Span: Fair  Recall:  [employer] of Knowledge:  Fair  Language:  Good  Akathisia:  Negative  Handed:  Right  AIMS (if indicated):     Assets:  Desire for Improvement Housing Leisure Time Physical Health Resilience  ADL's:  Intact  Cognition:  WNL  Sleep:  Number of Hours: 5.75     Treatment Plan Summary: Daily contact with patient to assess and evaluate symptoms and progress in treatment and Medication management   Continue with current treatment plan on 12/30/2017 as listed below except for noted  Schizoaffective:  1.  Stop Seroquel 2.  Risperdal 0.5 mg p.o. daily and 1 mg p.o. nightly for hallucinations and mood stability. 3.  Seizure precautions 4.  Gabapentin 100 mg p.o. 3 times daily and Robaxin 500 mg p.o. every 8 as needed for chronic pain, mood stability and anxiety.  5.  Social work will continue to work on residential substance abuse treatment programs 6.  Disposition planning-in progress.  Oneta Rack, NP 12/30/2017, 3:33 PM   ..Agree with NP Progress Note

## 2017-12-30 NOTE — Progress Notes (Signed)
Pt attended AA group this evening.  

## 2017-12-30 NOTE — [facility] Group Notes (Signed)
Adult Psychoeducational Group Note Adult Psychoeducational Group Note  Date:  12/30/2017 Time:  1:15PM  Group Topic/Focus: Cognitive Behavioral Therapy Managing Feelings:   The focus of this group is to identify what feelings patients have difficulty handling and develop a plan to handle them in a healthier way upon discharge.  Participation Level:  Active  Participation Quality:  Appropriate, Attentive and Sharing  Affect:  Appropriate  Cognitive:  Appropriate  Insight: Appropriate  Engagement in Group:  Engaged and Supportive  Modes of Intervention:  Discussion, Education, Exploration and Socialization  Additional Comments:  Pt attended and participated during RN psycho-educational group.  

## 2017-12-31 MED ORDER — RISPERIDONE 2 MG PO TABS
2.0000 mg | ORAL_TABLET | Freq: Every day | ORAL | Status: DC
  Administered 2017-12-31 – 2018-01-01 (×2): 2 mg via ORAL
  Filled 2017-12-31: qty 15
  Filled 2017-12-31 (×2): qty 1
  Filled 2017-12-31: qty 15

## 2017-12-31 MED ORDER — NICOTINE POLACRILEX 2 MG MT GUM: 2.0000 mg | CHEWING_GUM | OROMUCOSAL | Status: DC | PRN

## 2017-12-31 MED ORDER — BENZTROPINE MESYLATE 0.5 MG PO TABS
0.5000 mg | ORAL_TABLET | Freq: Every day | ORAL | Status: DC
  Administered 2017-12-31 – 2018-01-01 (×2): 0.5 mg via ORAL
  Filled 2017-12-31 (×2): qty 15
  Filled 2017-12-31 (×2): qty 1

## 2017-12-31 MED ORDER — TRAZODONE HCL 100 MG PO TABS
100.0000 mg | ORAL_TABLET | Freq: Every evening | ORAL | Status: DC | PRN
  Administered 2017-12-31 – 2018-01-01 (×2): 100 mg via ORAL
  Filled 2017-12-31 (×2): qty 1

## 2017-12-31 NOTE — [facility] Group Notes (Signed)
[facility] LCSW Group Therapy Note  12/31/2017  10:00-11:00AM  Type of Therapy and Topic:  Group Therapy:  Adding Supports Including Being Your Own Support  Participation Level:  Did Not Attend   Description of Group:  Patients in this group were introduced to the concept that additional supports including self-support are an essential part of recovery.  A song entitled "I Need Help!" was played and a group discussion was held in reaction to the idea of needing to add supports.  A song entitled "My Own Hero" was played and a group discussion ensued in which patients stated they could relate to the song and it inspired them to realize they have be willing to help themselves in order to succeed, because other people cannot achieve sobriety or stability for them.  We discussed adding a variety of healthy supports to address the various needs in their lives.  A song was played called "I Know Where I've Been" toward the end of group and used to conduct an inspirational wrap-up to group of remembering how far they have already come in their journey.  Therapeutic Goals: 1)  demonstrate the importance of being a part of one's own support system 2)  discuss reasons people in one's life may eventually be unable to be continually supportive  3)  identify the patient's current support system and   4)  elicit commitments to add healthy supports and to become more conscious of being self-supportive   Summary of Patient Progress:  N/A   Therapeutic Modalities:   Motivational Interviewing Activity  Genae Strine J Grossman-Orr       

## 2017-12-31 NOTE — Plan of Care (Signed)
  Problem: Health Behavior/Discharge Planning: Goal: Compliance with treatment plan for underlying cause of condition will improve Outcome: Progressing   Problem: Physical Regulation: Goal: Ability to maintain clinical measurements within normal limits will improve Outcome: Progressing   Problem: Safety: Goal: Periods of time without injury will increase Outcome: Progressing   

## 2017-12-31 NOTE — Progress Notes (Addendum)
[facility] MD Progress Note  12/31/2017 11:52 AM Howard Carlson  MRN:  [id number]    Evaluation: Howard Carlson observed interacting with peers with a bright and pleasant affect.However during this assessment made minimal eye contact rated his depression 10 out of 10.  Reports ongoing mood irritability insomnia.  Discussed titrating Seroquel from 50 mg to 200 mg and will increase Resporal 1 mg to 2 mg p.o. nightly for reported racing thoughts.  Continues to express suicidal and homicidal ideations however denies plan or intent.  Reports a good appetite.  Patient was encouraged to attend daily group sessions.  Support encouragement reassurance was provided  Charted on admission:Patient is a 27 year old male with a reported past psychiatric history of schizoaffective disorder who presented as a walk-in patient to the behavioral health hospital. He reported suicidal ideation. He reported multiple plans. He stated he would jump off a bridge, cut his wrist and bleed to death, or several different other methods. "I had one really good one to hang myself underneath the bridge so no one would be able to find me". He stated he had become acutely suicidal 4 days prior to admission. He also has a history of polysubstance abuse issues. He has been admitted to the psychiatric hospital on multiple occasions. Chart review revealed his last psychiatric admission at our facility was on 05/10/2017. The admission criteria at that time was essentially the same as it is now. At that time he stated that he plan to jump off from a bridge but stopped himself from jumping. He was discharged on gabapentin, naltrexone, Seroquel. This morning he refused to get out of bed to discuss any of his issues.   Principal Problem: <principal problem not specified> Diagnosis: Active Problems:   Schizoaffective disorder (HCC)  Total Time spent with patient: 15 minutes    Past Medical History:  Past Medical History:  Diagnosis Date   . Anxiety   . Bipolar affective (HCC)   . Schizophrenic disorder (HCC)    History reviewed. No pertinent surgical history. Family History: History reviewed. No pertinent family history. Family Psychiatric  History: See admission H&P Social History:  Social History   Substance and Sexual Activity  Alcohol Use Yes     Social History   Substance and Sexual Activity  Drug Use Yes  . Types: Marijuana, "Crack" cocaine, Cocaine   Comment: daily use recently    Social History   Socioeconomic History  . Marital status: Married    Spouse name: Not on file  . Number of children: Not on file  . Years of education: Not on file  . Highest education level: Not on file  Occupational History  . Not on file  Social Needs  . Financial resource strain: Not on file  . Food insecurity:    Worry: Not on file    Inability: Not on file  . Transportation needs:    Medical: Not on file    Non-medical: Not on file  Tobacco Use  . Smoking status: Current Every Day Smoker    Types: Cigarettes  . Smokeless tobacco: Never Used  Substance and Sexual Activity  . Alcohol use: Yes  . Drug use: Yes    Types: Marijuana, "Crack" cocaine, Cocaine    Comment: daily use recently  . Sexual activity: Yes  Lifestyle  . Physical activity:    Days per week: Not on file    Minutes per session: Not on file  . Stress: Not on file  Relationships  . Social connections:  Talks on phone: Not on file    Gets together: Not on file    Attends religious service: Not on file    Active member of club or organization: Not on file    Attends meetings of clubs or organizations: Not on file    Relationship status: Not on file  Other Topics Concern  . Not on file  Social History Narrative  . Not on file   Additional Social History:    Pain Medications: see MAR Prescriptions: see MAR Over the Counter: see MAR                    Sleep: Fair  Appetite:  Good  Current Medications: Current  Facility-Administered Medications  Medication Dose Route Frequency Provider Last Rate Last Dose  . alum & mag hydroxide-simeth (MAALOX/MYLANTA) 200-200-20 MG/5ML suspension 30 mL  30 mL Oral Q4H PRN Antonieta Pert, MD   30 mL at 12/30/17 1547  . feeding supplement (ENSURE ENLIVE) (ENSURE ENLIVE) liquid 237 mL  237 mL Oral BID BM Antonieta Pert, MD   237 mL at 12/30/17 1509  . gabapentin (NEURONTIN) capsule 100 mg  100 mg Oral TID Antonieta Pert, MD   100 mg at 12/31/17 1137  . hydrOXYzine (ATARAX/VISTARIL) tablet 50 mg  50 mg Oral Q6H PRN Kerry Hough, PA-C   50 mg at 12/30/17 1929  . ibuprofen (ADVIL,MOTRIN) tablet 600 mg  600 mg Oral Q6H PRN Armandina Stammer I, NP   600 mg at 12/30/17 1132  . methocarbamol (ROBAXIN) tablet 500 mg  500 mg Oral Q8H PRN Oneta Rack, NP   500 mg at 12/30/17 1508  . nicotine (NICODERM CQ - dosed in mg/24 hours) patch 21 mg  21 mg Transdermal Daily Antonieta Pert, MD   21 mg at 12/31/17 1137  . pantoprazole (PROTONIX) EC tablet 20 mg  20 mg Oral Daily Oneta Rack, NP   20 mg at 12/31/17 1137  . risperiDONE (RISPERDAL) tablet 0.5 mg  0.5 mg Oral Daily Antonieta Pert, MD   0.5 mg at 12/31/17 1137  . risperiDONE (RISPERDAL) tablet 1 mg  1 mg Oral QHS Antonieta Pert, MD   1 mg at 12/30/17 2129  . traZODone (DESYREL) tablet 50 mg  50 mg Oral QHS PRN,MR X 1 Kerry Hough, PA-C   50 mg at 12/30/17 2242    Lab Results:  No results found for this or any previous visit (from the past 48 hour(s)).  Blood Alcohol level:  Lab Results  Component Value Date   ETH <10 05/30/2017   ETH <10 05/08/2017    Metabolic Disorder Labs: Lab Results  Component Value Date   HGBA1C 5.6 01/15/2016   MPG 114 01/15/2016   Lab Results  Component Value Date   PROLACTIN 33.1 (H) 01/15/2016   Lab Results  Component Value Date   CHOL 143 01/15/2016   TRIG 91 01/15/2016   HDL 47 01/15/2016   CHOLHDL 3.0 01/15/2016   VLDL 18 01/15/2016   LDLCALC 78  01/15/2016    Physical Findings: AIMS: Facial and Oral Movements Muscles of Facial Expression: None, normal Lips and Perioral Area: None, normal Jaw: None, normal Tongue: None, normal,Extremity Movements Upper (arms, wrists, hands, fingers): None, normal Lower (legs, knees, ankles, toes): None, normal, Trunk Movements Neck, shoulders, hips: None, normal, Overall Severity Severity of abnormal movements (highest score from questions above): None, normal Incapacitation due to abnormal movements: None, normal Patient's awareness  of abnormal movements (rate only patient's report): No Awareness, Dental Status Current problems with teeth and/or dentures?: No Does patient usually wear dentures?: No  CIWA:    COWS:     Musculoskeletal: Strength & Muscle Tone: within normal limits Gait & Station: normal Patient leans: N/A  Psychiatric Specialty Exam: Physical Exam  Nursing note and vitals reviewed. Constitutional: He is oriented to person, place, and time. He appears well-developed and well-nourished.  HENT:  Head: Normocephalic and atraumatic.  Respiratory: Effort normal.  Neurological: He is alert and oriented to person, place, and time.  Psychiatric: He has a normal mood and affect. His behavior is normal.    Review of Systems  Psychiatric/Behavioral: Positive for depression and hallucinations. The patient is nervous/anxious.   All other systems reviewed and are negative.   Blood pressure 106/62, pulse 82, temperature 98.3 F (36.8 C), temperature source Oral, resp. rate 18, height 5\' 7"  (1.702 m), weight 71.2 kg, SpO2 100 %.Body mass index is 24.59 kg/m.  General Appearance: Casual and Guarded  Eye Contact:  Fair  Speech:  Normal Rate  Volume:  Decreased  Mood:  Anxious and Depressed  Affect:  Congruent  Thought Process:  Coherent and Descriptions of Associations: Intact  Orientation:  Full (Time, Place, and Person)  Thought Content:  Hallucinations: Auditory denies  hallucinations are command in nature  Suicidal Thoughts:  Yes.  without intent/plan  Homicidal Thoughts:  No  Memory:  Immediate;   Fair Recent;   Fair Remote;   Fair  Judgement:  Intact  Insight:  Lacking  Psychomotor Activity:  Normal  Concentration:  Concentration: Fair and Attention Span: Fair  Recall:  [employer]Fair  Fund of Knowledge:  Fair  Language:  Good  Akathisia:  Negative  Handed:  Right  AIMS (if indicated):     Assets:  Desire for Improvement Housing Leisure Time Physical Health Resilience  ADL's:  Intact  Cognition:  WNL  Sleep:  Number of Hours: 6.5     Treatment Plan Summary: Daily contact with patient to assess and evaluate symptoms and progress in treatment and Medication management   Continue with current treatment plan on 12/31/2017 as listed below except for noted  Schizoaffective:  1.  Stop Seroquel 2.  Risperdal 0.5 mg p.o. daily and 2 mg p.o. nightly for hallucinations and mood stability. 3. Initiated cogentin  0.5mg  for EPS  4.  Seizure precautions 5.  Gabapentin 100 mg p.o. 3 times daily and Robaxin 500 mg p.o. every 8 as needed for chronic pain, mood stability and anxiety. 6. Increased Trazodone 50 mg to 100 mg PO QH   7.  Social work will continue to work on residential substance abuse treatment programs 8..  Disposition planning-in progress.  Oneta Rackanika N Lewis, NP 12/31/2017, 11:52 AM ..Agree with NP Progress Note

## 2017-12-31 NOTE — Progress Notes (Addendum)
Patient ID: Eli PhillipsDeontae Munguia, male   DOB: Sep 22, 1990, 27 y.o.   MRN: [id number]  Nursing Progress Note [id number]  Data: On initial approach, patient was still resting in bed this morning. Patient did not get up when prompted for morning medications but did take them at lunch. Patient reports he had a hard time falling asleep and needed the morning to "catch up". Patient did not attend morning group. Patient did go to breakfast. Patient completed self-inventory sheet and rates depression, hopelessness, and anxiety 5,7,6 respectively. Patient rates their sleep and appetite as poor/good respectively. Patient states goal for today is to "write down my coping skills and triggers". Patient currently denies SI/HI/AVH.   Action: Patient is educated about and provided medication per provider's orders. Patient safety maintained with q15 min safety checks and frequent rounding. Low fall risk precautions in place. Emotional support given. 1:1 interaction and active listening provided. Patient encouraged to attend meals, groups, and work on treatment plan and goals. Labs, vital signs and patient behavior monitored throughout shift.   Response: Patient remains safe on the unit at this time and agrees to come to staff with any issues/concerns.  Will continue to support and monitor.

## 2018-01-01 MED ORDER — RISPERIDONE 2 MG PO TABS: 2.0000 mg | ORAL_TABLET | Freq: Every day | ORAL | 2 refills | Status: AC

## 2018-01-01 MED ORDER — GABAPENTIN 100 MG PO CAPS: 100.0000 mg | ORAL_CAPSULE | Freq: Three times a day (TID) | ORAL | 2 refills | Status: AC

## 2018-01-01 MED ORDER — PANTOPRAZOLE SODIUM 20 MG PO TBEC: 20.0000 mg | DELAYED_RELEASE_TABLET | Freq: Every day | ORAL | 2 refills | Status: AC

## 2018-01-01 NOTE — [facility] Group Notes (Signed)
LCSW Group Therapy Note   01/01/2018 1:15pm   Type of Therapy and Topic:  Group Therapy:  Overcoming Obstacles   Participation Level:  Did Not Attend--pt invited. Chose to remain in bed.    Description of Group:    In this group patients will be encouraged to explore what they see as obstacles to their own wellness and recovery. They will be guided to discuss their thoughts, feelings, and behaviors related to these obstacles. The group will process together ways to cope with barriers, with attention given to specific choices patients can make. Each patient will be challenged to identify changes they are motivated to make in order to overcome their obstacles. This group will be process-oriented, with patients participating in exploration of their own experiences as well as giving and receiving support and challenge from other group members.   Therapeutic Goals: 1. Patient will identify personal and current obstacles as they relate to admission. 2. Patient will identify barriers that currently interfere with their wellness or overcoming obstacles.  3. Patient will identify feelings, thought process and behaviors related to these barriers. 4. Patient will identify two changes they are willing to make to overcome these obstacles:      Summary of Patient Progress   x   Therapeutic Modalities:   Cognitive Behavioral Therapy Solution Focused Therapy Motivational Interviewing Relapse Prevention Therapy  Howard RavensHeather S Compton Brigance, LCSW 01/01/2018 11:08 AM

## 2018-01-01 NOTE — Progress Notes (Addendum)
  [facility] :  Will you be returning to the same living situation after discharge:  No. Pt [facility]  At discharge, do you have transportation home?: Yes--pt has car in parking lot and plans to drive himself to [facility]. He must discharge no later than 7am on Tues, 12/10. Pt requested $5 gas money to get to [facility]--CSW placed in envelope in patient chart.  Do you have the ability to pay for your medications: Yes,  [facility] Medicaid  Release of information consent forms completed and submitted to medical records by CSW.   Patient to Follow up at: Follow-up Information    [facility] Go on 01/05/2018.   Specialty:  Behavioral Health Why:  Your hospital follow up appointment is Friday, 01/05/18 at 8:00a.  Please bring: photo ID, proof of insurance, social security card, and any discharge paperwork from this hospitalization. Contact information: [address] [place] [address] [phone]        [facility] Follow up on 01/02/2018.   Why:  [facility] on Tuesday, 01/02/18. Please bring: photo ID/proof of [place] residency, 14 day supply of medications/30 day prescription (provided by hospital), and clothing. Thank you.  Contact information: [place] [place] [address] [phone]           Next level of care provider [facility] Link:no  Safety Planning and Suicide Prevention discussed: Yes,  SPE completed with pt; pt declined to consent to collateral contact. SPI pamphlet and mobile crisis information provided.   Have you used any form of tobacco in the last 30 days? (Cigarettes, Smokeless Tobacco, Cigars, and/or Pipes): Yes  Has patient been referred to the Quitline?: Patient refused referral  Patient has been referred for addiction treatment: Yes  Rona RavensHeather S Afsana Liera, LCSW 01/01/2018,  9:43 AM

## 2018-01-01 NOTE — Progress Notes (Signed)
[facility]  01/01/2018 9:53 AM Howard PhillipsDeontae Carlson  MRN:  [id number] Subjective:    Patient in bed not participating much would like to stay hospitalized partly due to homelessness however he has an appointment [facility] and he can likely stay until then.  No thoughts of harming self or others no acute psychosis no withdrawal no eye contact and minimal participation in the interview however Principal Problem: Suicidal thoughts on presentation Diagnosis: Active Problems:   Schizoaffective disorder (HCC)  Total Time spent with patient: 20 minutes  Past Psychiatric History: Similar presentations  Past Medical History:  Past Medical History:  Diagnosis Date  . Anxiety   . Bipolar affective (HCC)   . Schizophrenic disorder (HCC)    History reviewed. No pertinent surgical history. Family History: History reviewed. No pertinent family history. Family Psychiatric  History: no new info Social History:  Social History   Substance and Sexual Activity  Alcohol Use Yes     Social History   Substance and Sexual Activity  Drug Use Yes  . Types: Marijuana, "Crack" cocaine, Cocaine   Comment: daily use recently    Social History   Socioeconomic History  . Marital status: Married    Spouse name: Not on file  . Number of children: Not on file  . Years of education: Not on file  . Highest education level: Not on file  Occupational History  . Not on file  Social Needs  . Financial resource strain: Not on file  . Food insecurity:    Worry: Not on file    Inability: Not on file  . Transportation needs:    Medical: Not on file    Non-medical: Not on file  Tobacco Use  . Smoking status: Current Every Day Smoker    Types: Cigarettes  . Smokeless tobacco: Never Used  Substance and Sexual Activity  . Alcohol use: Yes  . Drug use: Yes    Types: Marijuana, "Crack" cocaine, Cocaine    Comment: daily use recently  . Sexual activity: Yes  Lifestyle  .  Physical activity:    Days per week: Not on file    Minutes per session: Not on file  . Stress: Not on file  Relationships  . Social connections:    Talks on phone: Not on file    Gets together: Not on file    Attends religious service: Not on file    Active member of club or organization: Not on file    Attends meetings of clubs or organizations: Not on file    Relationship status: Not on file  Other Topics Concern  . Not on file  Social History Narrative  . Not on file   Additional Social History:    Pain Medications: see MAR Prescriptions: see MAR Over the Counter: see MAR                    Sleep: Good  Appetite:  Good  Current Medications: Current Facility-Administered Medications  Medication Dose Route Frequency Provider Last Rate Last Dose  . alum & mag hydroxide-simeth (MAALOX/MYLANTA) 200-200-20 MG/5ML suspension 30 mL  30 mL Oral Q4H PRN Antonieta Pertlary, Greg Lawson, MD   30 mL at 12/31/17 2114  . benztropine (COGENTIN) tablet 0.5 mg  0.5 mg Oral QHS Oneta RackLewis, Tanika N, NP   0.5 mg at 12/31/17 2237  . feeding supplement (ENSURE ENLIVE) (ENSURE ENLIVE) liquid 237 mL  237 mL Oral BID BM Jola Babinskilary, Marlane MingleGreg Lawson, MD  237 mL at 12/31/17 1405  . gabapentin (NEURONTIN) capsule 100 mg  100 mg Oral TID Antonieta Pert, MD   100 mg at 12/31/17 1700  . hydrOXYzine (ATARAX/VISTARIL) tablet 50 mg  50 mg Oral Q6H PRN Kerry Hough, PA-C   50 mg at 12/30/17 1929  . ibuprofen (ADVIL,MOTRIN) tablet 600 mg  600 mg Oral Q6H PRN Armandina Stammer I, NP   600 mg at 12/31/17 1405  . methocarbamol (ROBAXIN) tablet 500 mg  500 mg Oral Q8H PRN Oneta Rack, NP   500 mg at 12/30/17 1508  . nicotine polacrilex (NICORETTE) gum 2 mg  2 mg Oral PRN Cobos, Rockey Situ, MD      . pantoprazole (PROTONIX) EC tablet 20 mg  20 mg Oral Daily Oneta Rack, NP   20 mg at 12/31/17 1137  . risperiDONE (RISPERDAL) tablet 0.5 mg  0.5 mg Oral Daily Antonieta Pert, MD   0.5 mg at 12/31/17 1137  . risperiDONE  (RISPERDAL) tablet 2 mg  2 mg Oral QHS Oneta Rack, NP   2 mg at 12/31/17 2237  . traZODone (DESYREL) tablet 100 mg  100 mg Oral QHS PRN,MR X 1 Oneta Rack, NP   100 mg at 12/31/17 2237    Lab Results: No results found for this or any previous visit (from the past 48 hour(s)).  Blood Alcohol level:  Lab Results  Component Value Date   ETH <10 05/30/2017   ETH <10 05/08/2017    Metabolic Disorder Labs: Lab Results  Component Value Date   HGBA1C 5.6 01/15/2016   MPG 114 01/15/2016   Lab Results  Component Value Date   PROLACTIN 33.1 (H) 01/15/2016   Lab Results  Component Value Date   CHOL 143 01/15/2016   TRIG 91 01/15/2016   HDL 47 01/15/2016   CHOLHDL 3.0 01/15/2016   VLDL 18 01/15/2016   LDLCALC 78 01/15/2016    Physical Findings: AIMS: Facial and Oral Movements Muscles of Facial Expression: None, normal Lips and Perioral Area: None, normal Jaw: None, normal Tongue: None, normal,Extremity Movements Upper (arms, wrists, hands, fingers): None, normal Lower (legs, knees, ankles, toes): None, normal, Trunk Movements Neck, shoulders, hips: None, normal, Overall Severity Severity of abnormal movements (highest score from questions above): None, normal Incapacitation due to abnormal movements: None, normal Patient's awareness of abnormal movements (rate only patient's report): No Awareness, Dental Status Current problems with teeth and/or dentures?: No Does patient usually wear dentures?: No  CIWA:    COWS:     Musculoskeletal: Strength & Muscle Tone: within normal limits Gait & Station: normal Patient leans: N/A  Psychiatric Specialty Exam: Physical Exam  ROS  Blood pressure 106/62, pulse 82, temperature 98.3 F (36.8 C), temperature source Oral, resp. rate 18, height 5\' 7"  (1.702 m), weight 71.2 kg, SpO2 100 %.Body mass index is 24.59 kg/m.  General Appearance: Guarded  Eye Contact:  None  Speech:  Slow and Slurred  Volume:  Decreased  Mood:   Dysphoric  Affect:  Congruent  Thought Process:  Goal Directed  Orientation:  Full (Time, Place, and Person)  Thought Content:  Logical  Suicidal Thoughts:  No  Homicidal Thoughts:  No  Memory:  Immediate;   Fair  Judgement:  Fair  Insight:  Fair  Psychomotor Activity:  Psychomotor Retardation  Concentration:  Concentration: Fair  Recall:  [employer] of Knowledge:  Fair  Language:  Fair  Akathisia:  Negative  Handed:  Right  AIMS (if indicated):     Assets:  Physical Health  ADL's:  Intact  Cognition:  WNL  Sleep:  Number of Hours: 6    Discharge tomorrow for follow-up at rehab facility Treatment Plan Summary: Daily contact with patient to assess and evaluate symptoms and progress in treatment and Medication management  Malvin Johns, MD 01/01/2018, 9:53 AM

## 2018-01-01 NOTE — Progress Notes (Signed)
Recreation Therapy Notes  Date: 12.9.19 Time: 0930 Location: [address] Dayroom  Group Topic: Stress Management  Goal Area(s) Addresses:  Patient will verbalize importance of using healthy stress management.  Patient will identify positive emotions associated with healthy stress management.   Intervention: Stress Management  Activity :  Guided Imagery.  LRT introduced the stress management technique of guided imagery.  LRT read a script for patients to visualize being at the beach.  Patients were to follow along as script was read.  Education:  Stress Management, Discharge Planning.   Education Outcome: Acknowledges edcuation/In group clarification offered/Needs additional education  Clinical Observations/Feedback: Pt did not attend activity.     Caroll RancherMarjette Doyl Bitting, LRT/CTRS         Caroll RancherLindsay, Junior Huezo A 01/01/2018 12:40 PM

## 2018-01-01 NOTE — Progress Notes (Signed)
D: Pt was in his room upon initial approach.  Pt presents with appropriate affect and mood.  He is less hyperactive tonight compared to last night.  He describes his day as "pretty good, great."  His goal is "reading.  Leadership type stuff."  Pt denies SI/HI, reports having visual hallucinations earlier today, denies pain.  Pt reports decreased sleep last night.  Pt has been visible in milieu interacting with peers and staff appropriately.  Pt attended evening group.    A: Met with pt 1:1.  Actively listened to pt and offered support and encouragement.  Medications administered per order.  PRN medication administered for sleep and indigestion.  Q15 minute safety checks maintained.  R: Pt is safe on the unit.  Pt is compliant with medications.  Pt verbally contracts for safety.  Will continue to monitor and assess.

## 2018-01-01 NOTE — [facility] Suicide Risk Assessment (Signed)
[facility]   Principal Problem: Schizoaffective disorder depressed type in the context of cocaine abuse and probable issues of secondary gain due to homelessness Discharge Diagnoses: Active Problems:   Schizoaffective disorder (HCC)   Total Time spent with patient: 30 minutes  Musculoskeletal: Strength & Muscle Tone: within normal limits Gait & Station: normal Patient leans: N/A  Psychiatric Specialty Exam: ROS  Blood pressure 106/62, pulse 82, temperature 98.3 F (36.8 C), temperature source Oral, resp. rate 18, height 5\' 7"  (1.702 m), weight 71.2 kg, SpO2 100 %.Body mass index is 24.59 kg/m.  General Appearance: Casual  Eye Contact::  Poor  Speech:  Slow409  Volume:  Decreased  Mood:  Irritable  Affect:  Flat  Thought Process:  Goal Directed  Orientation:  Full (Time, Place, and Person)  Thought Content:  Logical  Suicidal Thoughts:  No  Homicidal Thoughts:  No  Memory:  NA Immediate;   Fair  Judgement:  Intact  Insight:  Fair  Psychomotor Activity:  Normal  Concentration:  Poor effort  Recall:  [employer]  Fund of Knowledge:Fair  Language: Fair  Akathisia:  Negative  Handed:  Right  AIMS (if indicated):     Assets:  Communication Skills Desire for Improvement  Sleep:  Number of Hours: 6  Cognition: WNL  ADL's:  Intact   Mental Status Per Nursing Assessment::   On Admission:  Suicidal ideation indicated by patient, Self-harm thoughts  Demographic Factors:  Male  Loss Factors: Decrease in vocational status  Historical Factors: NA  Risk Reduction Factors:   Religious beliefs about death  Continued Clinical Symptoms:  Dysthymia  Cognitive Features That Contribute To Risk:  None    Suicide Risk:  Minimal: No identifiable suicidal ideation.  Patients presenting with no risk factors but with morbid ruminations; may be classified as minimal risk based on the severity of the depressive symptoms  Follow-up Information    [facility].  Go on 01/05/2018.   Specialty:  Behavioral Health Why:  Your hospital follow up appointment is Friday, 01/05/18 at 8:00a.  Please bring: photo ID, proof of insurance, social security card, and any discharge paperwork from this hospitalization. Contact information: [address] [place] [address] [phone]        Services, [facility] Follow up.   Why:  referral faxed to june Contact information: [address] Jenkins[address] [place] [address] [phone]           Plan Of Care/Follow-up recommendations:  Activity:  full  Corby Villasenor, MD 01/01/2018, 9:09 AM

## 2018-01-01 NOTE — Plan of Care (Signed)
  Problem: Activity: Goal: Interest or engagement in activities will improve Outcome: Progressing   Problem: Safety: Goal: Periods of time without injury will increase Outcome: Progressing  DAR NOTE: Patient presents with calm affect and pleasant mood.  Denies suicidal thoughts, pain, auditory and visual hallucinations.  Rates depression at 5, hopelessness at 5, and anxiety at 4.  Maintained on routine safety checks.  Medications given as prescribed.  Support and encouragement offered as needed.  Attended group and participated.    Patient observed socializing with peers in the dayroom.  Offered no complaint.

## 2018-01-02 NOTE — Discharge Summary (Signed)
Physician Discharge Summary Note  Patient:  Howard Carlson is an 27 y.o., male  MRN:  [id number]  DOB:  1990/10/21  Patient phone:  [phone] (home)   Patient address:   [place] [address],   Total Time spent with patient: Greater than 30 minutes  Date of Admission:  12/27/2017  Date of Discharge: 01/02/2018  Reason for Admission: Suicidal ideations with several plans.   Principal Problem: Schizoaffective disorder [facility])  Discharge Diagnoses: Patient Active Problem List   Diagnosis Date Noted  . Schizoaffective disorder (HCC) [F25.9] 12/27/2017    Priority: High  . Cocaine abuse with cocaine-induced mood disorder [facility]) [F14.14] 05/09/2017    Priority: Medium  . Cocaine use disorder, mild, abuse (HCC) [F14.10] 01/20/2016  . Cannabis use disorder, mild, abuse [F12.10] 01/20/2016  . Chlamydia infection [A74.9] 01/20/2016  . Schizoaffective disorder, bipolar type (HCC) [F25.0] 01/13/2016  . Cocaine abuse [facility]) [F14.10] 09/22/2014   Past Psychiatric History: Schizoaffective disorder, Cocaine use disorder.   Past Medical History:  Past Medical History:  Diagnosis Date  . Anxiety   . Bipolar affective (HCC)   . Schizophrenic disorder (HCC)    History reviewed. No pertinent surgical history. Family History: History reviewed. No pertinent family history.  Family Psychiatric  History: See H&P  Social History:  Social History   Substance and Sexual Activity  Alcohol Use Yes     Social History   Substance and Sexual Activity  Drug Use Yes  . Types: Marijuana, "Crack" cocaine, Cocaine   Comment: daily use recently    Social History   Socioeconomic History  . Marital status: Married    Spouse name: Not on file  . Number of children: Not on file  . Years of education: Not on file  . Highest education level: Not on file  Occupational History  . Not on file  Social Needs  . Financial resource strain: Not on file  . Food insecurity:    Worry:  Not on file    Inability: Not on file  . Transportation needs:    Medical: Not on file    Non-medical: Not on file  Tobacco Use  . Smoking status: Current Every Day Smoker    Types: Cigarettes  . Smokeless tobacco: Never Used  Substance and Sexual Activity  . Alcohol use: Yes  . Drug use: Yes    Types: Marijuana, "Crack" cocaine, Cocaine    Comment: daily use recently  . Sexual activity: Yes  Lifestyle  . Physical activity:    Days per week: Not on file    Minutes per session: Not on file  . Stress: Not on file  Relationships  . Social connections:    Talks on phone: Not on file    Gets together: Not on file    Attends religious service: Not on file    Active member of club or organization: Not on file    Attends meetings of clubs or organizations: Not on file    Relationship status: Not on file  Other Topics Concern  . Not on file  Social History Narrative  . Not on file   Hospital Course: (Per Md's admission assessment): Patient is a 27 year old male with a reported past psychiatric history of schizoaffective disorder who presented as a walk-in patient to the behavioral health hospital. He reported suicidal ideation. He reported multiple plans. He stated he would jump off a bridge, cut his wrist and bleed to death or several different other methods. "I had one really good  one to hang myself underneath the bridge so no one would be able to find me". He stated he had become acutely suicidal 4 days prior to admission. He also has a history of polysubstance abuse issues. He has been admitted to the psychiatric hospital on multiple occasions. Chart review revealed his last psychiatric admission at our facility was on 05/10/2017. The admission criteria at that time was essentially the same as it is now. At that time he stated that he plan to jump off from a bridge but stopped himself from jumping. He was discharged on gabapentin, naltrexone, Seroquel. This morning he refused to  get out of bed to discuss any of his issues.  Mica was admitted to the [facility] hospital with his UDS test reports showing positive Cocaine. However, his reason for admission was worsening symptoms of depression & suicidal ideations with multiple plans to kill himself. He was seeking mood stabilization treatments.   After evaluation of his presenting symptoms, Rumeal was started on the medication regimen for his presenting symptoms. His medication regimen included; Risperdal2 mg for mood control & Gabapentin 100 mg for agitation. He was also enrolled in & participated in the group counseling sessions being offered and held on this unit, he learned coping skills that should help him cope better and maintain mood stability after discharge. Other than GERD, he presented no other significant pre-existing health issues that required treatment and or monitoring. He tolerated his treatment regimen without any significant adverse effects and or reactions reported.  During the course of his hospitalization, Carla's symptoms were evaluated on daily basis by a clinical provider to ascertain that they are responding to his treatment regimen. This is evidenced by his reports of decreasing symptoms, improved mood & presentation of good affect. Part of his discharged plans is referring Murel to a residential substance abuse treatment center to continue further substance abuse treatment after discharge. He did agree to this plan.   On this day of his hospital discharge, Onaje is in much improved condition than upon admission. Patient contracted for his safety and felt more in control of his mood. His symptoms were reported as significantly decreased or resolved completely. Patient denied SI/HI and voiced no AVH. He is instructed & motivated to continue taking medications with a goal of continued improvement in mental health. The patient was provided with a 14 day worth supply sample his discharge medications. He is  instructed to call 911, the crisis hotline, go to the nearest ED or come back to [facility] in the event of worsening symptoms. He left [facility] in no apparent distress with all belongings, destination; [facility].Marland Kitchen  Physical Findings: AIMS: Facial and Oral Movements Muscles of Facial Expression: None, normal Lips and Perioral Area: None, normal Jaw: None, normal Tongue: None, normal,Extremity Movements Upper (arms, wrists, hands, fingers): None, normal Lower (legs, knees, ankles, toes): None, normal, Trunk Movements Neck, shoulders, hips: None, normal, Overall Severity Severity of abnormal movements (highest score from questions above): None, normal Incapacitation due to abnormal movements: None, normal Patient's awareness of abnormal movements (rate only patient's report): No Awareness, Dental Status Current problems with teeth and/or dentures?: No Does patient usually wear dentures?: No  CIWA:    COWS:     Musculoskeletal: Strength & Muscle Tone: within normal limits Gait & Station: normal Patient leans: N/A  Psychiatric Specialty Exam: See SRA by MD  Physical Exam  Nursing note and vitals reviewed. Constitutional: He is oriented to person, place, and time. He appears well-developed.  HENT:  Head: Normocephalic.  Eyes: Pupils are equal, round, and reactive to light.  Neck: Normal range of motion.  Cardiovascular: Normal rate.  Respiratory: Effort normal.  GI: Soft.  Genitourinary:  Genitourinary Comments: Deferred  Musculoskeletal: Normal range of motion.  Neurological: He is alert and oriented to person, place, and time.  Skin: Skin is warm.  Psychiatric: He has a normal mood and affect. His behavior is normal.    Review of Systems  Constitutional: Negative.   HENT: Negative.   Eyes: Negative.   Respiratory: Negative.  Negative for cough and shortness of breath.   Cardiovascular: Negative.  Negative for chest pain and palpitations.  Gastrointestinal:  Negative.  Negative for abdominal pain, heartburn, nausea and vomiting.  Skin: Negative.   Neurological: Negative.  Negative for dizziness and headaches.  Endo/Heme/Allergies: Negative.   Psychiatric/Behavioral: Positive for depression (Stable) and substance abuse. Negative for hallucinations, memory loss and suicidal ideas. The patient has insomnia (Stable). The patient is not nervous/anxious (Stable).   All other systems reviewed and are negative.   Blood pressure 106/62, pulse 82, temperature 98.3 F (36.8 C), temperature source Oral, resp. rate 18, height 5\' 7"  (1.702 m), weight 71.2 kg, SpO2 100 %.Body mass index is 24.59 kg/m.  See Md's discharge SRA.   Have you used any form of tobacco in the last 30 days? (Cigarettes, Smokeless Tobacco, Cigars, and/or Pipes): Yes  Has this patient used any form of tobacco in the last 30 days? (Cigarettes, Smokeless Tobacco, Cigars, and/or Pipes) No  Blood Alcohol level:  Lab Results  Component Value Date   ETH <10 05/30/2017   ETH <10 05/08/2017   Metabolic Disorder Labs:  Lab Results  Component Value Date   HGBA1C 5.6 01/15/2016   MPG 114 01/15/2016   Lab Results  Component Value Date   PROLACTIN 33.1 (H) 01/15/2016   Lab Results  Component Value Date   CHOL 143 01/15/2016   TRIG 91 01/15/2016   HDL 47 01/15/2016   CHOLHDL 3.0 01/15/2016   VLDL 18 01/15/2016   LDLCALC 78 01/15/2016   See Psychiatric Specialty Exam and Suicide Risk Assessment completed by Attending Physician prior to discharge.  Discharge destination:  [facility]  Is patient on multiple antipsychotic therapies at discharge:  No   Has Patient had three or more failed trials of antipsychotic monotherapy by history:  No  Recommended Plan for Multiple Antipsychotic Therapies: NA  Allergies as of 01/02/2018      Reactions   Amoxicillin Anaphylaxis, Other (See Comments)   Has patient had a PCN reaction causing immediate rash, facial/tongue/throat  swelling, SOB or lightheadedness with hypotension: Yes Has patient had a PCN reaction causing severe rash involving mucus membranes or skin necrosis: No Has patient had a PCN reaction that required hospitalization No Has patient had a PCN reaction occurring within the last 10 years: No If all of the above answers are "NO", then may proceed with Cephalosporin use.      Medication List    TAKE these medications     Indication  gabapentin 100 MG capsule Commonly known as:  NEURONTIN Take 1 capsule (100 mg total) by mouth 3 (three) times daily.  Indication:  Neuropathic Pain   pantoprazole 20 MG tablet Commonly known as:  PROTONIX Take 1 tablet (20 mg total) by mouth daily.  Indication:  Gastroesophageal Reflux Disease   risperiDONE 2 MG tablet Commonly known as:  RISPERDAL Take 1 tablet (2 mg total) by mouth at bedtime.  Indication:  Major Depressive Disorder      Follow-up Information    [facility]. Go on 01/05/2018.   Specialty:  Behavioral Health Why:  Your hospital follow up appointment is Friday, 01/05/18 at 8:00a.  Please bring: photo ID, proof of insurance, social security card, and any discharge paperwork from this hospitalization. Contact information: [address] [place] [address] [phone]        [facility] Follow up on 01/02/2018.   Why:  [facility]. Please bring: photo ID/proof of [place] residency, 14 day supply of medications/30 day prescription (provided by hospital), and clothing. Thank you.  Contact information: [address] [place] [address] [phone]          Follow-up recommendations: Activity:  As tolerated Diet: As recommended by your primary care doctor. Keep all scheduled follow-up appointments as recommended.   Comments: Patient is instructed prior to discharge to: Take all medications as prescribed by his/her mental healthcare  provider. Report any adverse effects and or reactions from the medicines to his/her outpatient provider promptly. Patient has been instructed & cautioned: To not engage in alcohol and or illegal drug use while on prescription medicines. In the event of worsening symptoms, patient is instructed to call the crisis hotline, 911 and or go to the nearest ED for appropriate evaluation and treatment of symptoms. To follow-up with his/her primary care provider for your other medical issues, concerns and or health care needs.     Signed: Armandina StammerAgnes Bence Trapp, NP, pmhnp, fnp-bc 01/02/2018, 2:52 PM

## 2018-01-02 NOTE — Plan of Care (Signed)
Patient remains safe on the unit.  Problem: Education: Goal: Knowledge of [facility] Education information/materials will improve Outcome: Adequate for Discharge   Problem: Education: Goal: Emotional status will improve Outcome: Adequate for Discharge   Problem: Education: Goal: Mental status will improve Outcome: Adequate for Discharge   Problem: Education: Goal: Verbalization of understanding the information provided will improve Outcome: Adequate for Discharge

## 2018-01-02 NOTE — [facility] Suicide Risk Assessment (Signed)
Principal Problem: Schizoaffective disorder depressed type in the context of cocaine abuse and probable issues of secondary gain due to homelessness Discharge Diagnoses: Active Problems:   Schizoaffective disorder (HCC)   Total Time spent with patient: 30 minutes  Musculoskeletal: Strength & Muscle Tone: within normal limits Gait & Station: normal Patient leans: N/A  Psychiatric Specialty Exam: ROS  Blood pressure 106/62, pulse 82, temperature 98.3 F (36.8 C), temperature source Oral, resp. rate 18, height 5\' 7"  (1.702 m), weight 71.2 kg, SpO2 100 %.Body mass index is 24.59 kg/m.  General Appearance: Casual  Eye Contact::  Poor  Speech:  Slow409  Volume:  Decreased  Mood:  Irritable  Affect:  Flat  Thought Process:  Goal Directed  Orientation:  Full (Time, Place, and Person)  Thought Content:  Logical  Suicidal Thoughts:  No  Homicidal Thoughts:  No  Memory:  NA Immediate;   Fair  Judgement:  Intact  Insight:  Fair  Psychomotor Activity:  Normal  Concentration:  Poor effort  Recall:  [employer]  Fund of Knowledge:Fair  Language: Fair  Akathisia:  Negative  Handed:  Right  AIMS (if indicated):     Assets:  Communication Skills Desire for Improvement  Sleep:  Number of Hours: 6  Cognition: WNL  ADL's:  Intact   Mental Status Per Nursing Assessment::   On Admission:  Suicidal ideation indicated by patient, Self-harm thoughts  Demographic Factors:  Male  Loss Factors: Decrease in vocational status  Historical Factors: NA  Risk Reduction Factors:   Religious beliefs about death  Continued Clinical Symptoms:  Dysthymia  Cognitive Features That Contribute To Risk:  None    Suicide Risk:  Minimal: No identifiable suicidal ideation.  Patients presenting with no risk factors but with morbid ruminations; may be classified as minimal risk based on the severity of the depressive symptoms     Follow-up Information    [facility]. Go on 01/05/2018.    Specialty:  Behavioral Health Why:  Your hospital follow up appointment is Friday, 01/05/18 at 8:00a.  Please bring: photo ID, proof of insurance, social security card, and any discharge paperwork from this hospitalization. Contact information: [address] [place] [address] [phone]     Malvin JohnsFARAH,Brighid Koch, MD 01/02/2018, 6:34 AM

## 2018-06-05 IMAGING — DX DG HAND COMPLETE 3+V*R*
3 series · 3 of 3 positions shown · non-contrast
Comparison: None.

CLINICAL DATA: 26 y/o M; swelling to the third and fourth
metacarpal area.

EXAM:
RIGHT HAND - COMPLETE 3+ VIEW

[hand ap]
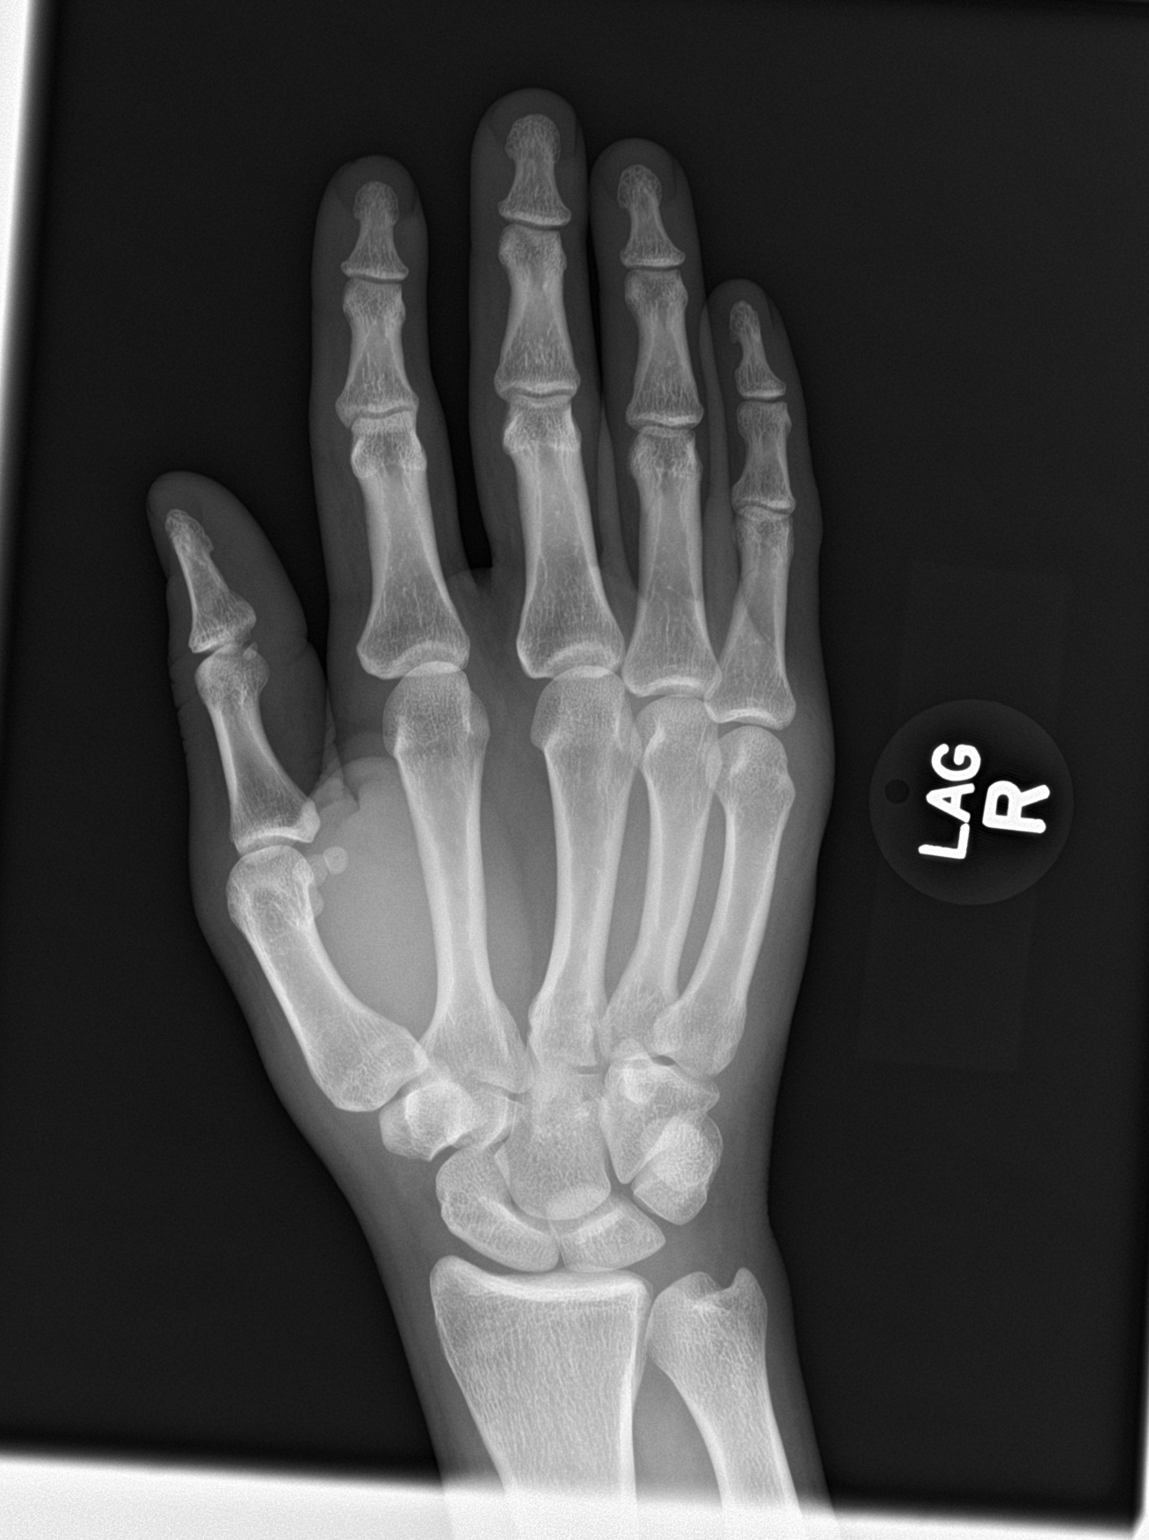

[hand obl]
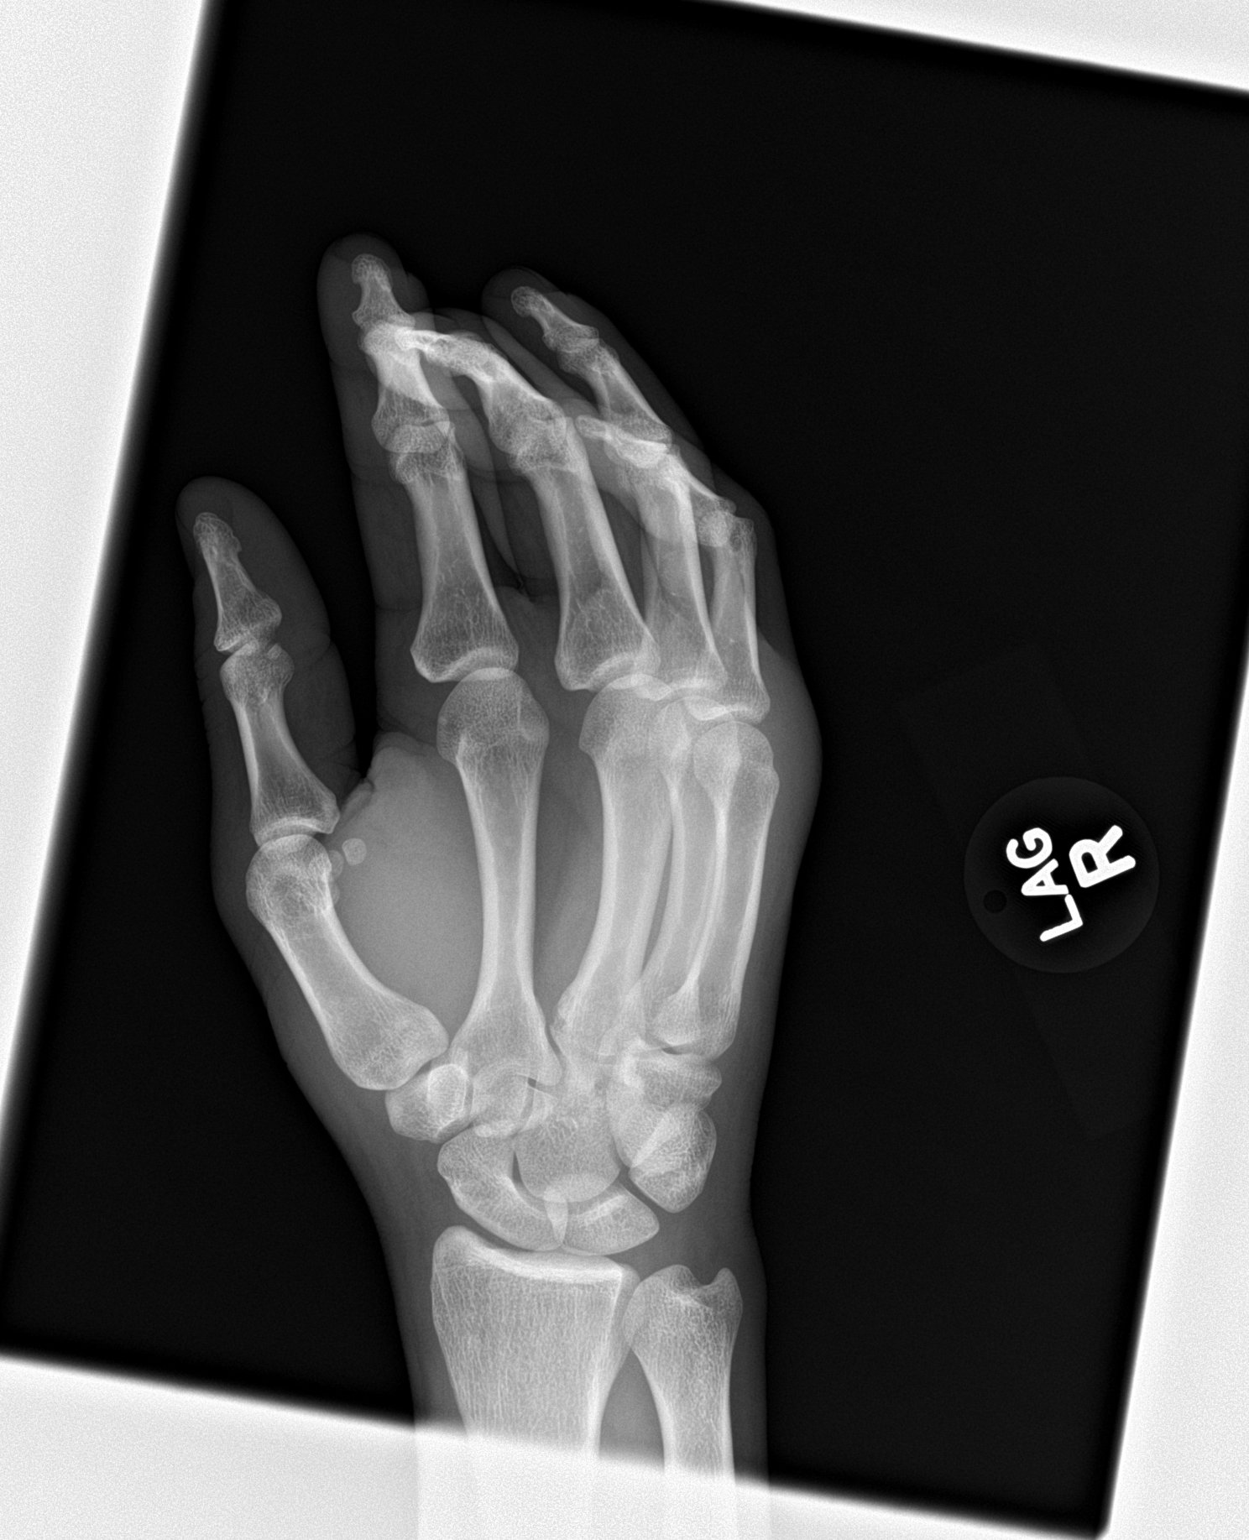

[hand lat]
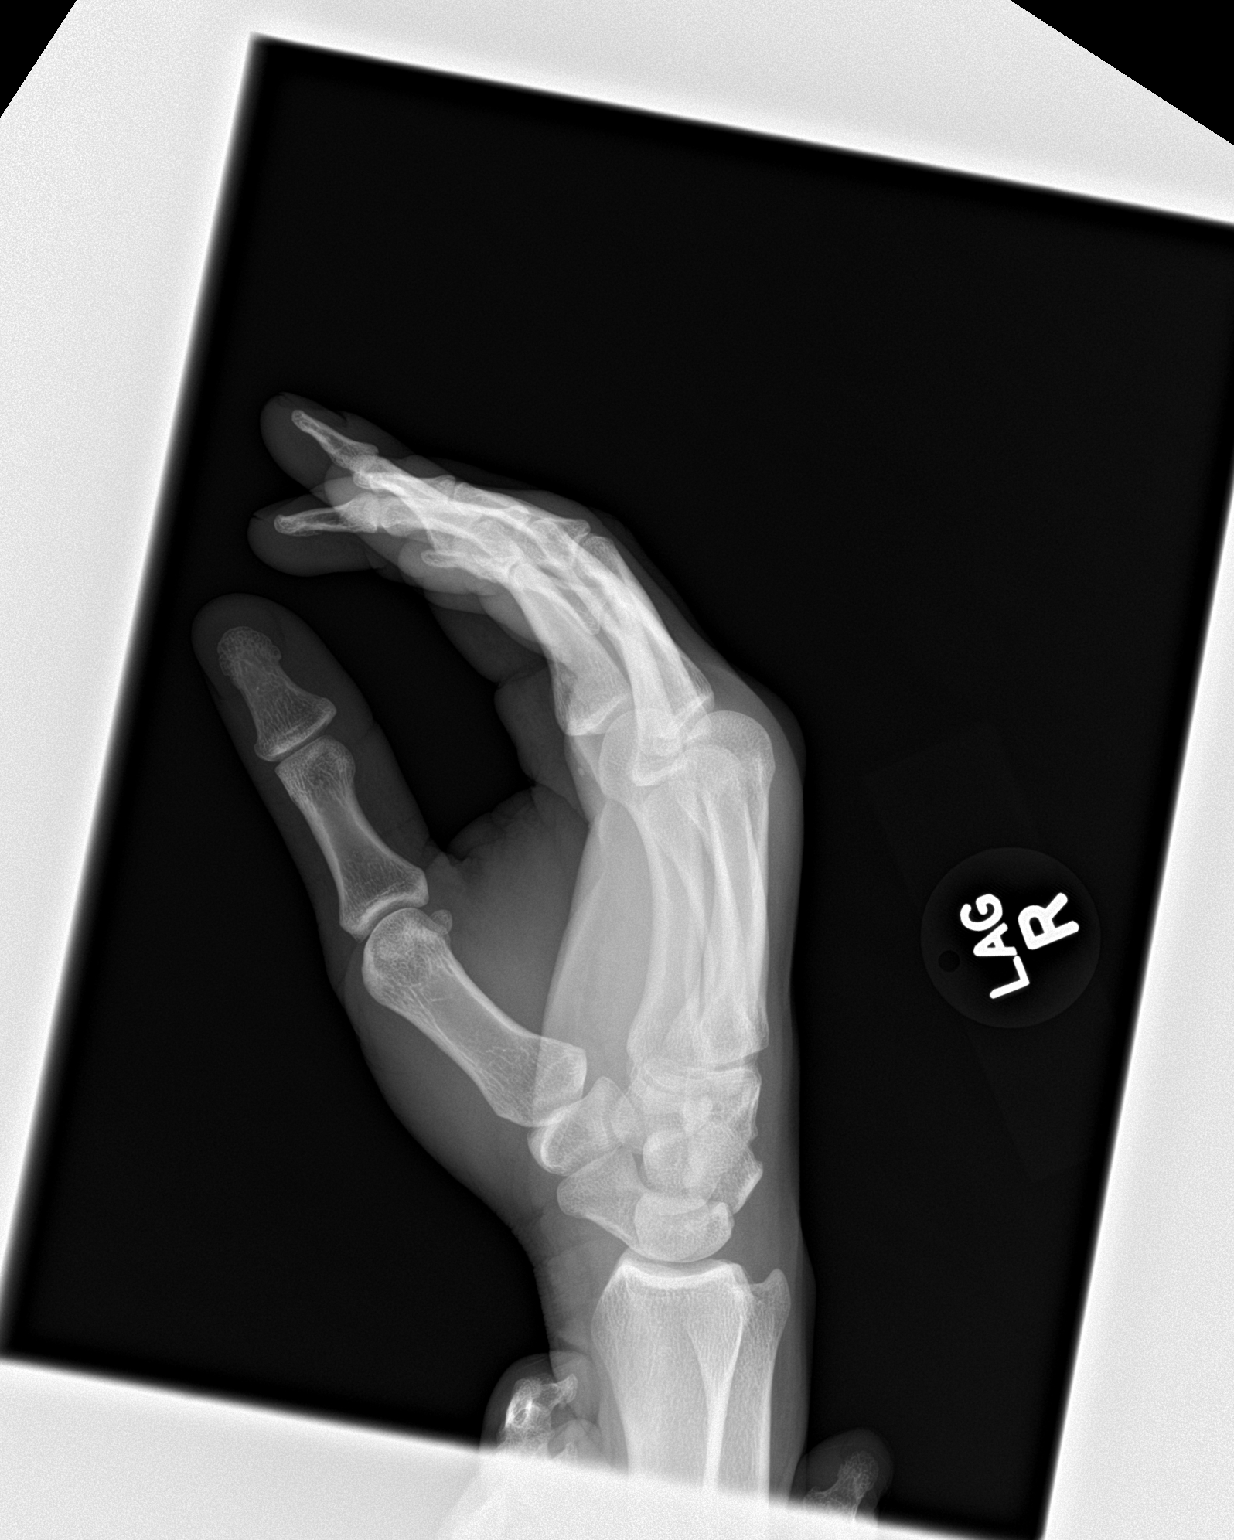

[3 of 3 positions shown; findings below may reference images not displayed]

FINDINGS: There is no evidence of fracture or dislocation. There is no
evidence of arthropathy or other focal bone abnormality. Soft
tissues are unremarkable.
IMPRESSION: Negative.

By: Giorgi Jumper M.D.

## 2018-07-18 ENCOUNTER — Inpatient Hospital Stay
Admit: 2018-07-18 | Discharge: 2018-07-19 | Disposition: A | Discharge status: Other Acute Inpatient Hospital | Payer: PRIVATE HEALTH INSURANCE | Attending: Emergency Medicine

## 2018-07-18 DIAGNOSIS — R45851 Suicidal ideations: Secondary | ICD-10-CM

## 2018-07-18 LAB — CBC WITH AUTOMATED DIFF
ABS. BASOPHILS: 0 10*3/uL (ref 0.0–0.1)
ABS. EOSINOPHILS: 0.1 10*3/uL (ref 0.0–0.4)
ABS. LYMPHOCYTES: 2.9 10*3/uL (ref 0.9–3.6)
ABS. MONOCYTES: 0.5 10*3/uL (ref 0.05–1.2)
ABS. NEUTROPHILS: 2.4 10*3/uL (ref 1.8–8.0)
BASOPHILS: 0 % (ref 0–2)
EOSINOPHILS: 2 % (ref 0–5)
HCT: 43.7 % (ref 36.0–48.0)
HGB: 15.2 g/dL (ref 13.0–16.0)
LYMPHOCYTES: 49 % (ref 21–52)
MCH: 29.6 PG (ref 24.0–34.0)
MCHC: 34.8 g/dL (ref 31.0–37.0)
MCV: 85.2 FL (ref 74.0–97.0)
MONOCYTES: 9 % (ref 3–10)
MPV: 12 FL — ABNORMAL HIGH (ref 9.2–11.8)
NEUTROPHILS: 40 % (ref 40–73)
PLATELET: 215 10*3/uL (ref 135–420)
RBC: 5.13 M/uL (ref 4.70–5.50)
RDW: 13.6 % (ref 11.6–14.5)
WBC: 5.9 10*3/uL (ref 4.6–13.2)

## 2018-07-18 LAB — ETHYL ALCOHOL
ALCOHOL(ETHYL),SERUM: <3 MG/DL (ref 0–3)
Ethyl Alcohol: <3 MG/DL (ref 0–3)

## 2018-07-18 LAB — CBC WITH AUTO DIFFERENTIAL
Basophils %: 0 % (ref 0–2)
Basophils Absolute: 0 10*3/uL (ref 0.0–0.1)
Eosinophils %: 2 % (ref 0–5)
Eosinophils Absolute: 0.1 10*3/uL (ref 0.0–0.4)
Hematocrit: 43.7 % (ref 36.0–48.0)
Hemoglobin: 15.2 g/dL (ref 13.0–16.0)
Lymphocytes %: 49 % (ref 21–52)
Lymphocytes Absolute: 2.9 10*3/uL (ref 0.9–3.6)
MCH: 29.6 PG (ref 24.0–34.0)
MCHC: 34.8 g/dL (ref 31.0–37.0)
MCV: 85.2 FL (ref 74.0–97.0)
MPV: 12 FL — ABNORMAL HIGH (ref 9.2–11.8)
Monocytes %: 9 % (ref 3–10)
Monocytes Absolute: 0.5 10*3/uL (ref 0.05–1.2)
Neutrophils %: 40 % (ref 40–73)
Neutrophils Absolute: 2.4 10*3/uL (ref 1.8–8.0)
Platelets: 215 10*3/uL (ref 135–420)
RBC: 5.13 M/uL (ref 4.70–5.50)
RDW: 13.6 % (ref 11.6–14.5)
WBC: 5.9 10*3/uL (ref 4.6–13.2)

## 2018-07-18 NOTE — ED Provider Notes (Signed)
ED Provider Notes by Einar Crow, NP at 07/18/18 2009                Author: Einar Crow, NP  Service: EMERGENCY  Author Type: Nurse Practitioner       Filed: 07/19/18 0304  Date of Service: 07/18/18 2009  Status: Attested           Editor: Einar Crow, NP (Nurse Practitioner)  Cosigner: Megan Salon, MD at 07/27/18 [phone]          Attestation signed by Megan Salon, MD at 07/27/18 1604          Although I did not see this patient personally, I was available for consultation in the emergency department.  I have read the note above and see no reason  why the patient should be called back for an evaluation or reassessment at this time.                                    [facility]   Emergency Department Treatment Report            8:09 PM Alan [name] is a 28 y.o. male  who presents to ED presents for a mental health evaluation.  Patient states he is suicidal and he would kill himself with fire.  No attempt thus far no physical symptoms at this time.      No other complaints, associated symptoms or modifying factors at this time.      PCP: None\\         The history is provided by the patient. No language interpreter  was used.    Suicidal    This is a new problem. The  current episode started 6 to 12 hours ago. The problem  occurs constantly. The problem has not changed since onset.Pertinent negatives include no chest pain, no abdominal pain, no headaches  and no shortness of breath. Nothing aggravates the symptoms. Nothing relieves the symptoms. He has tried nothing for the symptoms.           History reviewed. No pertinent past medical history.      History reviewed. No pertinent surgical history.        History reviewed. No pertinent family history.        Social History          Socioeconomic History         ?  Marital status:  DIVORCED              Spouse name:  Not on file         ?  Number of children:  Not on file     ?  Years of education:  Not on file      ?  Highest education level:  Not on file       Occupational History        ?  Not on file       Social Needs         ?  Financial resource strain:  Not on file        ?  Food insecurity              Worry:  Not on file         Inability:  Not on file        ?  Transportation needs  Medical:  Not on file         Non-medical:  Not on file       Tobacco Use         ?  Smoking status:  Current Every Day Smoker              Packs/day:  1.00       Substance and Sexual Activity         ?  Alcohol use:  Yes              Frequency:  Never             Comment: 6 pack of beer per day         ?  Drug use:  Yes              Types:  Marijuana         ?  Sexual activity:  Not on file       Lifestyle        ?  Physical activity              Days per week:  Not on file         Minutes per session:  Not on file         ?  Stress:  Not on file       Relationships        ?  Social Health visitor on phone:  Not on file         Gets together:  Not on file         Attends religious service:  Not on file         Active member of club or organization:  Not on file         Attends meetings of clubs or organizations:  Not on file         Relationship status:  Not on file        ?  Intimate partner violence              Fear of current or ex partner:  Not on file         Emotionally abused:  Not on file         Physically abused:  Not on file         Forced sexual activity:  Not on file        Other Topics  Concern        ?  Not on file       Social History Narrative        ?  Not on file              ALLERGIES: Amoxicillin      Review of Systems    Constitutional: Negative for fever.    Respiratory: Negative for chest tightness and shortness of breath.     Cardiovascular: Negative for chest pain.    Gastrointestinal: Negative for abdominal pain.    Skin: Negative for color change.    Neurological: Negative for headaches.    Psychiatric/Behavioral: Positive for suicidal ideas. Negative for self-injury.    All  other systems reviewed and are negative.           Vitals:          07/18/18 1854        BP:  121/85  Pulse:  80     Resp:  16     Temp:  98.8 ??F (37.1 ??C)     SpO2:  97%     Weight:  87.1 kg (192 lb)        Height:  '5\' 8"'$  (1.727 m)                Physical Exam   Vitals signs and nursing note reviewed.   Constitutional:        Appearance: He is well-developed.   HENT :       Head: Normocephalic and atraumatic.   Eyes :       Conjunctiva/sclera: Conjunctivae normal.      Pupils: Pupils are equal, round, and reactive to light.    Neck:       Musculoskeletal: Normal range of motion and neck supple.   Cardiovascular :       Rate and Rhythm: Normal rate and regular rhythm.   Pulmonary :       Effort: Pulmonary effort is normal.      Breath sounds: Normal breath sounds.   Abdominal :      General: Bowel sounds are normal.      Palpations: Abdomen is soft.     Musculoskeletal: Normal range of motion.    Skin:      General: Skin is warm and dry.      Capillary Refill: Capillary refill takes less than 2 seconds.    Neurological:       General: No focal deficit present.      Mental Status: He is alert and oriented to person, place, and time.      Deep Tendon Reflexes: Reflexes are normal and symmetric.   Psychiatric:         Attention and Perception: Attention normal.         Mood  and Affect: Mood is depressed.         Speech: Speech is delayed .         Behavior: Behavior is withdrawn.         Thought Content: Thought content includes  suicidal ideation. Thought content does not include homicidal ideation. Thought content includes suicidal  plan.         Cognition and Memory: Cognition normal.         Judgment: Judgment normal.              MDM   Number of Diagnoses or Management Options   Cocaine abuse ([place]):    Suicidal ideation:    Diagnosis management comments: Will obtain labs and UA for mental health evaluation.      Patient's creatinine is elevated I suspect he is elevated.  I will order 1 L of IV fluid to  replenish his fluids at this point in time.  I do not suspect any other acute illness      Mateo Flow with crisis informed of medical clearance.      Patient awaiting evaluation by [facility] and transfer to a facility.          Amount and/or Complexity of Data Reviewed   Clinical lab tests: ordered and reviewed    Review and summarize past medical records: yes    Independent visualization of images, tracings, or specimens: yes      Risk of Complications, Morbidity, and/or Mortality   Presenting problems: moderate  Diagnostic procedures: moderate  Management options: moderate     Patient Progress   Patient progress:  stable             Procedures               Vitals:   Patient Vitals for the past 12 hrs:            Temp  Pulse  Resp  BP  SpO2            07/18/18 1854  98.8 ??F (37.1 ??C)  80  16  121/85  97 %           Medications ordered:      Medications       sodium chloride 0.9 % bolus infusion 1,000 mL (has no administration in time range)             Lab findings:     Recent Results (from the past 12 hour(s))     CBC WITH AUTOMATED DIFF          Collection Time: 07/18/18  6:56 PM         Result  Value  Ref Range            WBC  5.9  4.6 - 13.2 K/uL       RBC  5.13  4.70 - 5.50 M/uL       HGB  15.2  13.0 - 16.0 g/dL       HCT  43.7  36.0 - 48.0 %       MCV  85.2  74.0 - 97.0 FL       MCH  29.6  24.0 - 34.0 PG       MCHC  34.8  31.0 - 37.0 g/dL       RDW  13.6  11.6 - 14.5 %            PLATELET  215  135 - 420 K/uL            MPV  12.0 (H)  9.2 - 11.8 FL       NEUTROPHILS  40  40 - 73 %       LYMPHOCYTES  49  21 - 52 %       MONOCYTES  9  3 - 10 %       EOSINOPHILS  2  0 - 5 %       BASOPHILS  0  0 - 2 %       ABS. NEUTROPHILS  2.4  1.8 - 8.0 K/UL       ABS. LYMPHOCYTES  2.9  0.9 - 3.6 K/UL       ABS. MONOCYTES  0.5  0.05 - 1.2 K/UL       ABS. EOSINOPHILS  0.1  0.0 - 0.4 K/UL       ABS. BASOPHILS  0.0  0.0 - 0.1 K/UL       DF  AUTOMATED          METABOLIC PANEL, COMPREHENSIVE          Collection Time: 07/18/18  6:56 PM          Result  Value  Ref Range            Sodium  134 (L)  136 - 145 mmol/L       Potassium  4.0  3.5 - 5.5 mmol/L       Chloride  100  100 - 111 mmol/L       CO2  27  21 - 32 mmol/L  Anion gap  7  3.0 - 18 mmol/L       Glucose  81  74 - 99 mg/dL       BUN  20 (H)  7.0 - 18 MG/DL       Creatinine  1.66 (H)  0.6 - 1.3 MG/DL       BUN/Creatinine ratio  12  12 - 20         GFR est AA  60 (L)  >60 ml/min/1.32m       GFR est non-AA  49 (L)  >60 ml/min/1.755m      Calcium  9.4  8.5 - 10.1 MG/DL       Bilirubin, total  0.6  0.2 - 1.0 MG/DL       ALT (SGPT)  27  16 - 61 U/L       AST (SGOT)  19  10 - 38 U/L       Alk. phosphatase  60  45 - 117 U/L       Protein, total  9.1 (H)  6.4 - 8.2 g/dL       Albumin  4.4  3.4 - 5.0 g/dL       Globulin  4.7 (H)  2.0 - 4.0 g/dL            A-G Ratio  0.9  0.8 - 1.7         ETHYL ALCOHOL          Collection Time: 07/18/18  6:56 PM         Result  Value  Ref Range            ALCOHOL(ETHYL),SERUM  <3  0 - 3 MG/DL       URINALYSIS W/ RFLX MICROSCOPIC          Collection Time: 07/18/18  6:56 PM         Result  Value  Ref Range            Color  DARK YELLOW          Appearance  CLEAR          Specific gravity  >1.030 (H)  1.005 - 1.030       pH (UA)  5.0  5.0 - 8.0         Protein  TRACE (A)  NEG mg/dL       Glucose  Negative  NEG mg/dL       Ketone  15 (A)  NEG mg/dL       Bilirubin  SMALL (A)  NEG         Blood  Negative  NEG         Urobilinogen  1.0  0.2 - 1.0 EU/dL       Nitrites  Negative  NEG         Leukocyte Esterase  Negative  NEG         DRUG SCREEN, URINE          Collection Time: 07/18/18  6:56 PM         Result  Value  Ref Range            BENZODIAZEPINES  Negative  NEG         BARBITURATES  Negative  NEG         THC (TH-CANNABINOL)  Positive (A)  NEG         OPIATES  Negative  NEG  PCP(PHENCYCLIDINE)  Negative  NEG         COCAINE  Positive (A)  NEG         AMPHETAMINES  Negative  NEG         METHADONE  Negative  NEG         HDSCOM  (NOTE)         URINE MICROSCOPIC  ONLY          Collection Time: 07/18/18  6:56 PM         Result  Value  Ref Range            WBC  0 to 3  0 - 5 /hpf       RBC  Negative  0 - 5 /hpf       Epithelial cells  FEW  0 - 5 /lpf       Bacteria  1+ (A)  NEG /hpf       Mucus  3+ (A)  NEG /lpf            Granular cast  3 to 5  NEG /lpf              Disposition:      Diagnosis:       1.  Cocaine abuse ([place])         2.  Suicidal ideation            Disposition: TBD                  Patient's Medications       Start Taking          No medications on file       Continue Taking           RISPERIDONE (RISPERDAL) 4 MG TABLET     Take 4 mg by mouth.       These Medications have changed          No medications on file       Stop Taking          No medications on file                 3:03 AM : Pt care transferred to Dr. Lovena Le  ,ED provider. History of patient complaint(s), available diagnostic reports and current treatment plan has been discussed thoroughly.    Bedside rounding on patient occured : no .   Intended disposition of patient : TBD   Pending diagnostics reports and/or labs (please list): pending transfer by [facility]               Allayne Butcher ENP-C,FNP-C         Dragon voice recognition was used to generate this report, which may have resulted in some phonetic based errors in grammar and contents. Even though attempts were made to correct  all the mistakes, some may have been missed, and remained in the body of the document

## 2018-07-18 NOTE — ED Notes (Signed)
Patient to triage with suicidal ideations. Symptoms started 2 days ago. Pt denies HI and hallucinations/delusions. The pt stated he was planning to set himself on fire.

## 2018-07-18 NOTE — ED Triage Notes (Signed)
Patient to triage with suicidal ideations. Symptoms started 2 days ago. Pt denies HI and hallucinations/delusions. The pt stated he was planning to set himself on fire.

## 2018-07-18 NOTE — ED Provider Notes (Signed)
[facility]  Emergency Department Treatment Report        8:09 PM Alan [name] is a 28 y.o. male  who presents to ED presents for a mental health evaluation.  Patient states he is suicidal and he would kill himself with fire.  No attempt thus far no physical symptoms at this time.    No other complaints, associated symptoms or modifying factors at this time.    PCP: None\\      The history is provided by the patient. No language interpreter was used.   Suicidal   This is a new problem. The current episode started 6 to 12 hours ago. The problem occurs constantly. The problem has not changed since onset.Pertinent negatives include no chest pain, no abdominal pain, no headaches and no shortness of breath. Nothing aggravates the symptoms. Nothing relieves the symptoms. He has tried nothing for the symptoms.        History reviewed. No pertinent past medical history.    History reviewed. No pertinent surgical history.      History reviewed. No pertinent family history.    Social History     Socioeconomic History   ??? Marital status: DIVORCED     Spouse name: Not on file   ??? Number of children: Not on file   ??? Years of education: Not on file   ??? Highest education level: Not on file   Occupational History   ??? Not on file   Social Needs   ??? Financial resource strain: Not on file   ??? Food insecurity     Worry: Not on file     Inability: Not on file   ??? Transportation needs     Medical: Not on file     Non-medical: Not on file   Tobacco Use   ??? Smoking status: Current Every Day Smoker     Packs/day: 1.00   Substance and Sexual Activity   ??? Alcohol use: Yes     Frequency: Never     Comment: 6 pack of beer per day   ??? Drug use: Yes     Types: Marijuana   ??? Sexual activity: Not on file   Lifestyle   ??? Physical activity     Days per week: Not on file     Minutes per session: Not on file   ??? Stress: Not on file   Relationships   ??? Social Product manager on phone: Not on file     Gets together: Not on file      Attends religious service: Not on file     Active member of club or organization: Not on file     Attends meetings of clubs or organizations: Not on file     Relationship status: Not on file   ??? Intimate partner violence     Fear of current or ex partner: Not on file     Emotionally abused: Not on file     Physically abused: Not on file     Forced sexual activity: Not on file   Other Topics Concern   ??? Not on file   Social History Narrative   ??? Not on file         ALLERGIES: Amoxicillin    Review of Systems   Constitutional: Negative for fever.   Respiratory: Negative for chest tightness and shortness of breath.    Cardiovascular: Negative for chest pain.   Gastrointestinal: Negative for abdominal pain.  Skin: Negative for color change.   Neurological: Negative for headaches.   Psychiatric/Behavioral: Positive for suicidal ideas. Negative for self-injury.   All other systems reviewed and are negative.      Vitals:    07/18/18 1854   BP: 121/85   Pulse: 80   Resp: 16   Temp: 98.8 ??F (37.1 ??C)   SpO2: 97%   Weight: 87.1 kg (192 lb)   Height: 5' 8"  (1.727 m)            Physical Exam  Vitals signs and nursing note reviewed.   Constitutional:       Appearance: He is well-developed.   HENT:      Head: Normocephalic and atraumatic.   Eyes:      Conjunctiva/sclera: Conjunctivae normal.      Pupils: Pupils are equal, round, and reactive to light.   Neck:      Musculoskeletal: Normal range of motion and neck supple.   Cardiovascular:      Rate and Rhythm: Normal rate and regular rhythm.   Pulmonary:      Effort: Pulmonary effort is normal.      Breath sounds: Normal breath sounds.   Abdominal:      General: Bowel sounds are normal.      Palpations: Abdomen is soft.   Musculoskeletal: Normal range of motion.   Skin:     General: Skin is warm and dry.      Capillary Refill: Capillary refill takes less than 2 seconds.   Neurological:      General: No focal deficit present.       Mental Status: He is alert and oriented to person, place, and time.      Deep Tendon Reflexes: Reflexes are normal and symmetric.   Psychiatric:         Attention and Perception: Attention normal.         Mood and Affect: Mood is depressed.         Speech: Speech is delayed.         Behavior: Behavior is withdrawn.         Thought Content: Thought content includes suicidal ideation. Thought content does not include homicidal ideation. Thought content includes suicidal plan.         Cognition and Memory: Cognition normal.         Judgment: Judgment normal.          MDM  Number of Diagnoses or Management Options  Cocaine abuse ([place]):   Suicidal ideation:   Diagnosis management comments: Will obtain labs and UA for mental health evaluation.    Patient's creatinine is elevated I suspect he is elevated.  I will order 1 L of IV fluid to replenish his fluids at this point in time.  I do not suspect any other acute illness    Mateo Flow with crisis informed of medical clearance.    Patient awaiting evaluation by [facility] and transfer to a facility.       Amount and/or Complexity of Data Reviewed  Clinical lab tests: ordered and reviewed  Review and summarize past medical records: yes  Independent visualization of images, tracings, or specimens: yes    Risk of Complications, Morbidity, and/or Mortality  Presenting problems: moderate  Diagnostic procedures: moderate  Management options: moderate    Patient Progress  Patient progress: stable         Procedures          Vitals:  Patient Vitals for the past  12 hrs:   Temp Pulse Resp BP SpO2   07/18/18 1854 98.8 ??F (37.1 ??C) 80 16 121/85 97 %       Medications ordered:   Medications   sodium chloride 0.9 % bolus infusion 1,000 mL (has no administration in time range)         Lab findings:  Recent Results (from the past 12 hour(s))   CBC WITH AUTOMATED DIFF    Collection Time: 07/18/18  6:56 PM   Result Value Ref Range    WBC 5.9 4.6 - 13.2 K/uL    RBC 5.13 4.70 - 5.50 M/uL     HGB 15.2 13.0 - 16.0 g/dL    HCT 43.7 36.0 - 48.0 %    MCV 85.2 74.0 - 97.0 FL    MCH 29.6 24.0 - 34.0 PG    MCHC 34.8 31.0 - 37.0 g/dL    RDW 13.6 11.6 - 14.5 %    PLATELET 215 135 - 420 K/uL    MPV 12.0 (H) 9.2 - 11.8 FL    NEUTROPHILS 40 40 - 73 %    LYMPHOCYTES 49 21 - 52 %    MONOCYTES 9 3 - 10 %    EOSINOPHILS 2 0 - 5 %    BASOPHILS 0 0 - 2 %    ABS. NEUTROPHILS 2.4 1.8 - 8.0 K/UL    ABS. LYMPHOCYTES 2.9 0.9 - 3.6 K/UL    ABS. MONOCYTES 0.5 0.05 - 1.2 K/UL    ABS. EOSINOPHILS 0.1 0.0 - 0.4 K/UL    ABS. BASOPHILS 0.0 0.0 - 0.1 K/UL    DF AUTOMATED     METABOLIC PANEL, COMPREHENSIVE    Collection Time: 07/18/18  6:56 PM   Result Value Ref Range    Sodium 134 (L) 136 - 145 mmol/L    Potassium 4.0 3.5 - 5.5 mmol/L    Chloride 100 100 - 111 mmol/L    CO2 27 21 - 32 mmol/L    Anion gap 7 3.0 - 18 mmol/L    Glucose 81 74 - 99 mg/dL    BUN 20 (H) 7.0 - 18 MG/DL    Creatinine 1.66 (H) 0.6 - 1.3 MG/DL    BUN/Creatinine ratio 12 12 - 20      GFR est AA 60 (L) >60 ml/min/1.46m    GFR est non-AA 49 (L) >60 ml/min/1.786m   Calcium 9.4 8.5 - 10.1 MG/DL    Bilirubin, total 0.6 0.2 - 1.0 MG/DL    ALT (SGPT) 27 16 - 61 U/L    AST (SGOT) 19 10 - 38 U/L    Alk. phosphatase 60 45 - 117 U/L    Protein, total 9.1 (H) 6.4 - 8.2 g/dL    Albumin 4.4 3.4 - 5.0 g/dL    Globulin 4.7 (H) 2.0 - 4.0 g/dL    A-G Ratio 0.9 0.8 - 1.7     ETHYL ALCOHOL    Collection Time: 07/18/18  6:56 PM   Result Value Ref Range    ALCOHOL(ETHYL),SERUM <3 0 - 3 MG/DL   URINALYSIS W/ RFLX MICROSCOPIC    Collection Time: 07/18/18  6:56 PM   Result Value Ref Range    Color DARK YELLOW      Appearance CLEAR      Specific gravity >1.030 (H) 1.005 - 1.030    pH (UA) 5.0 5.0 - 8.0      Protein TRACE (A) NEG mg/dL    Glucose Negative NEG mg/dL  Ketone 15 (A) NEG mg/dL    Bilirubin SMALL (A) NEG      Blood Negative NEG      Urobilinogen 1.0 0.2 - 1.0 EU/dL    Nitrites Negative NEG      Leukocyte Esterase Negative NEG     DRUG SCREEN, URINE     Collection Time: 07/18/18  6:56 PM   Result Value Ref Range    BENZODIAZEPINES Negative NEG      BARBITURATES Negative NEG      THC (TH-CANNABINOL) Positive (A) NEG      OPIATES Negative NEG      PCP(PHENCYCLIDINE) Negative NEG      COCAINE Positive (A) NEG      AMPHETAMINES Negative NEG      METHADONE Negative NEG      HDSCOM (NOTE)    URINE MICROSCOPIC ONLY    Collection Time: 07/18/18  6:56 PM   Result Value Ref Range    WBC 0 to 3 0 - 5 /hpf    RBC Negative 0 - 5 /hpf    Epithelial cells FEW 0 - 5 /lpf    Bacteria 1+ (A) NEG /hpf    Mucus 3+ (A) NEG /lpf    Granular cast 3 to 5 NEG /lpf         Disposition:    Diagnosis:   1. Cocaine abuse ([place])    2. Suicidal ideation        Disposition: TBD           Patient's Medications   Start Taking    No medications on file   Continue Taking    RISPERIDONE (RISPERDAL) 4 MG TABLET    Take 4 mg by mouth.   These Medications have changed    No medications on file   Stop Taking    No medications on file           3:03 AM : Pt care transferred to Dr. Lovena Le  ,ED provider. History of patient complaint(s), available diagnostic reports and current treatment plan has been discussed thoroughly.   Bedside rounding on patient occured : no .  Intended disposition of patient : TBD  Pending diagnostics reports and/or labs (please list): pending transfer by [facility]          Allayne Butcher ENP-C,FNP-C      Dragon voice recognition was used to generate this report, which may have resulted in some phonetic based errors in grammar and contents. Even though attempts were made to correct all the mistakes, some may have been missed, and remained in the body of the document

## 2018-07-18 NOTE — Other (Signed)
Comprehensive Assessment     Integrated Summary    Patient is a 28 year old male who presented to the emergency room with c/o "feeling depressed with suicidal thoughts.Marland KitchenMarland KitchenI said that I wanted to set myself on fire, but I don't. I was feeling so stress. I'm going through a divorce.Marland KitchenMarland KitchenI have 4 kids, (2) kids live in [place], and the other 2 live with me. Plus, my parents kicked me out because I've been getting high and drinking."     Patient stated that he is no longer having thoughts, plans, or intentions of harm towards self or other. Patient said that he would like help with his substance abuse. Patient verbalized the he smokes "marijuana from the time my open eyes, until I go to bed at night." Patient added that he tried/snorted cocaine for the first time about 2 days, ago.Marland KitchenMarland KitchenI'm done with that...once and done." Patient also stated that he drinks alcoholic beverages everyday. "4 shots of whiskey, 6 pack-beer, 1-bottle of wine, and last use was 2 days, ago." Patient says that he does not have withdrawal symptoms, if he does not have alcoholic beverages.    Mental Status Exam    The patient's appearance shows no evidence of impairment.  The patient's behavior calm, cooperative. The patient is oriented to time, place, person and situation.  The patient's speech shows no evidence of impairment.  The patient's mood is depressed.  The range of affect shows no evidence of impairment.  The patient's thought content demonstrates no evidence of impairment.  The thought process shows no evidence of impairment.  The patient's perception shows no evidence of impairment. The patient's memory shows no evidence of impairment.  The patient's appetite shows no evidence of impairment.      Access to weapons: [place]: None  Inpatient Services: "[facility], can't recall date"  Contact/Support Person: "Philippa Chester, [phone]"    Disposition     Discussed with Allayne Butcher, NP, and patient. The plan is patient will be referred for possible Crisis Stabilization Unit bed; spoke with Lenn Sink, [phone], re: possible CSU bed for patient; awaiting response.    Mickle Mallory, RN, BSN

## 2018-07-18 NOTE — Other (Addendum)
Spoke with Jasmine from [facility], and took phone to patient for telepsych assessment via Facetime.

## 2018-07-19 LAB — METABOLIC PANEL, COMPREHENSIVE
A-G Ratio: 0.9 (ref 0.8–1.7)
ALT (SGPT): 27 U/L (ref 16–61)
AST (SGOT): 19 U/L (ref 10–38)
Albumin: 4.4 g/dL (ref 3.4–5.0)
Alk. phosphatase: 60 U/L (ref 45–117)
Anion gap: 7 mmol/L (ref 3.0–18)
BUN/Creatinine ratio: 12 (ref 12–20)
BUN: 20 MG/DL — ABNORMAL HIGH (ref 7.0–18)
Bilirubin, total: 0.6 MG/DL (ref 0.2–1.0)
CO2: 27 mmol/L (ref 21–32)
Calcium: 9.4 MG/DL (ref 8.5–10.1)
Chloride: 100 mmol/L (ref 100–111)
Creatinine: 1.66 MG/DL — ABNORMAL HIGH (ref 0.6–1.3)
GFR est AA: 60 mL/min/{1.73_m2} — ABNORMAL LOW (ref >60)
GFR est non-AA: 49 mL/min/{1.73_m2} — ABNORMAL LOW (ref >60)
Globulin: 4.7 g/dL — ABNORMAL HIGH (ref 2.0–4.0)
Glucose: 81 mg/dL (ref 74–99)
Potassium: 4 mmol/L (ref 3.5–5.5)
Protein, total: 9.1 g/dL — ABNORMAL HIGH (ref 6.4–8.2)
Sodium: 134 mmol/L — ABNORMAL LOW (ref 136–145)

## 2018-07-19 LAB — URINALYSIS W/ RFLX MICROSCOPIC
Blood, Urine: NEGATIVE
Blood: NEGATIVE
Glucose, Ur: NEGATIVE mg/dL
Glucose: NEGATIVE mg/dL
Ketone: 15 mg/dL — AB
Ketones, Urine: 15 mg/dL — AB
Leukocyte Esterase, Urine: NEGATIVE
Leukocyte Esterase: NEGATIVE
Nitrite, Urine: NEGATIVE
Nitrites: NEGATIVE
Specific Gravity, UA: >1.03 NA — ABNORMAL HIGH (ref 1.005–1.030)
Specific gravity: >1.03 — ABNORMAL HIGH (ref 1.005–1.030)
Urobilinogen, UA, POCT: 1 EU/dL (ref 0.2–1.0)
Urobilinogen: 1 EU/dL (ref 0.2–1.0)
pH (UA): 5 (ref 5.0–8.0)
pH, UA: 5 (ref 5.0–8.0)

## 2018-07-19 LAB — DRUG SCREEN, URINE
AMPHETAMINES: NEGATIVE
Amphetamine Screen, Urine: NEGATIVE
BARBITURATES: NEGATIVE
BENZODIAZEPINES: NEGATIVE
Barbiturate Screen, Urine: NEGATIVE
Benzodiazepine Screen, Urine: NEGATIVE
COCAINE: POSITIVE — AB
Cocaine Screen Urine: POSITIVE — AB
METHADONE: NEGATIVE
Methadone Screen, Urine: NEGATIVE
OPIATES: NEGATIVE
Opiate Screen, Urine: NEGATIVE
PCP Screen, Urine: NEGATIVE
PCP(PHENCYCLIDINE): NEGATIVE
THC (TH-CANNABINOL): POSITIVE — AB
THC Screen, Urine: POSITIVE — AB

## 2018-07-19 LAB — URINE MICROSCOPIC ONLY
Granular Casts, UA: 3 /lpf
Granular cast: 3 /lpf
RBC, UA: NEGATIVE /hpf (ref 0–5)
RBC: NEGATIVE /hpf (ref 0–5)
WBC, UA: 0 /hpf (ref 0–5)
WBC: 0 /hpf (ref 0–5)

## 2018-07-19 LAB — SARS-COV-2: COVID-19 rapid test: NOT DETECTED

## 2018-07-19 LAB — COMPREHENSIVE METABOLIC PANEL
ALT: 27 U/L (ref 16–61)
AST: 19 U/L (ref 10–38)
Albumin/Globulin Ratio: 0.9 (ref 0.8–1.7)
Albumin: 4.4 g/dL (ref 3.4–5.0)
Alkaline Phosphatase: 60 U/L (ref 45–117)
Anion Gap: 7 mmol/L (ref 3.0–18)
BUN: 20 MG/DL — ABNORMAL HIGH (ref 7.0–18)
Bun/Cre Ratio: 12 (ref 12–20)
CO2: 27 mmol/L (ref 21–32)
Calcium: 9.4 MG/DL (ref 8.5–10.1)
Chloride: 100 mmol/L (ref 100–111)
Creatinine: 1.66 MG/DL — ABNORMAL HIGH (ref 0.6–1.3)
EGFR IF NonAfrican American: 49 mL/min/{1.73_m2} — ABNORMAL LOW (ref >60)
GFR African American: 60 mL/min/{1.73_m2} — ABNORMAL LOW (ref >60)
Globulin: 4.7 g/dL — ABNORMAL HIGH (ref 2.0–4.0)
Glucose: 81 mg/dL (ref 74–99)
Potassium: 4 mmol/L (ref 3.5–5.5)
Sodium: 134 mmol/L — ABNORMAL LOW (ref 136–145)
Total Bilirubin: 0.6 MG/DL (ref 0.2–1.0)
Total Protein: 9.1 g/dL — ABNORMAL HIGH (ref 6.4–8.2)

## 2018-07-19 LAB — COVID-19: SARS-CoV-2, Rapid: NOT DETECTED

## 2018-07-19 MED ORDER — SODIUM CHLORIDE 0.9% BOLUS IV
0.9 % | Freq: Once | INTRAVENOUS | Status: AC
Start: 2018-07-19 — End: 2018-07-19
  Administered 2018-07-19: 07:00:00 via INTRAVENOUS

## 2018-07-19 MED FILL — SODIUM CHLORIDE 0.9 % IV: INTRAVENOUS | Qty: 1000

## 2018-07-19 NOTE — ED Notes (Signed)
Report called to Melvenia Beam, RN at [facility] Stabilization Unit.

## 2018-07-19 NOTE — ED Notes (Signed)
Pt aware of impending transfer. Denies needs or questions at this time.

## 2018-07-19 NOTE — ED Notes (Signed)
Life Coach seen in patient's hospital chart patient's insurance and PCP was not listed. Life Coach went into [facility] to assess patient.    Patient stated he was employed, he did not have a PCP or insurance. Patient stated he had recently applied for medicaid and disability [place].     Life coach and patient spoke about charity Care and [employer]. Patient was given [facility] application and a list of documentation needed to have application processed.     Patient was given [place] and address.[employer] phone number and web site to apply for Medicaid.      Life Coach scheduled patient at [facility] on 08/16/2018 at 2:00 pm with Dr. Zoila Shutter.     Patient was handed appointment reminder letter and Life Coach's contact information.

## 2018-07-19 NOTE — ED Notes (Signed)
[facility] here to transport pt. All belongings given to transport. Pt alert, oriented, VSS, NAD.

## 2018-07-19 NOTE — ED Notes (Signed)
Pt sleeping in stretcher with even, unlabored respirations. NAD. Will continue to monitor.

## 2018-07-19 NOTE — ED Notes (Signed)
[facility] here to transport pt. All belongings given to transport. Pt alert, oriented, VSS, NAD.

## 2018-07-19 NOTE — ED Notes (Signed)
Pt aware of impending transfer. Denies needs or questions at this time.

## 2018-07-19 NOTE — ED Notes (Signed)
Report called to Shari, RN at [facility].

## 2018-07-19 NOTE — ED Notes (Signed)
Life Coach seen in patient's hospital chart patient's insurance and PCP was not listed. Life Coach went into [facility] to assess patient.    Patient stated he was employed, he did not have a PCP or insurance. Patient stated he had recently applied for medicaid and disability in another state.     Life coach and patient spoke about charity Care and [employer]. Patient was given Charity Care application and a list of documentation needed to have application processed.     Patient was given Social Services contact information and address.[employer] phone number and web site to apply for Medicaid.      Life Coach scheduled patient at [facility] on 08/16/2018 at 2:00 pm with Dr. Rosenberg.     Patient was handed appointment reminder letter and Life Coach's contact information.

## 2018-10-30 NOTE — ED Notes (Signed)
Here patient is sleepy but arousable and is oriented when awakened he just states that he had thoughts of suicide but no plan.  He contracts for safety while here.

## 2018-10-30 NOTE — ED Notes (Signed)
Patient was brought in by medics who were called to the scene by bystander who stated that the patient was trying to hurt himself patient was observed wandering around with a box cutter and allegedly cut himself no lacerations have been observed on the patient, the medics stated that the patient himself told them that he smoked some weed and feels like it was laced with something because of how he felt afterward.

## 2018-10-30 NOTE — H&P (Signed)
[facility]  [facility] HISTORY AND PHYSICAL    Name:  Alan [name], Alan [name]  MR#:   [id number]  DOB:  1991/01/06  ACCOUNT #:  [id number]  ADMIT DATE:  10/30/2018    IDENTIFYING INFORMATION:  The patient is a 28 year old male, admitted to this facility on a voluntary basis on the above-mentioned date.    BASIS FOR ADMISSION:  The patient had been brought to the hospital's emergency room by friends who had called the police when the patient appeared to have become somewhat "unsteady" and apparently making very little sense.  When the patient was brought into the emergency room, he indicated that he was smoking cannabis with some friends and that he believes that one of them had placed something on the cannabis they were smoking "to the spice up the blunt."  The patient was unsure as to what was that the blunt was laced with; however, he apparently left the house and was found wandering outside.  Friends had apparently called the police, and so emergency services also were involved to the point in which he was brought to the attention of the emergency room.  It is not clear, however, as to how he was when emergency services arrived.  The patient indicated that he does not remember exactly what had happened, and the only thing that he remembers is that they were doing "a chest rub."    While in the emergency room, the patient was apparently considered to be medically cleared.  It appears that he was told that he was able to leave.  However, the patient then began to complain of increased difficulties with hearing voices telling him to hurt himself.  Describing himself as "wanting to die," the patient was presented to the physician on-call who kindly admitted the patient to my service and so the basis for this psychiatric admission.    PSYCHIATRIC HISTORY:  Difficult to obtain any clear history from the patient himself.  He indicated that he has been in [place] for 10 months and that prior to that he was staying  in the [place] area in [place] and being provided care by [facility]."  It appears that [facility] is a large psychiatric outpatient provider, not clear if they have access to their own hospitals or not.  However, the patient had been provided treatment with them.  He was not able to let me know, with exception of his being prescribed risperidone and also topiramate, as to the doses of medications prescribed for him nor which other medications he was also being prescribed.  He apparently takes 3 or 4 "other medications" to help him with his psychiatric difficulties.  It appears that the patient's depression has lasted for a long time.  However, it is not clear as to which other facilities the patient has been hospitalized with.  Unable to maintain self-control, the patient again required to be admitted to the facility after disclosing his being actively suicidal and also describing the presence of psychotic symptoms.    MEDICAL HISTORY:  Described as negative and/or not having any past medical history on file.  It is to be mentioned that the patient described some type of difficulties with his ambulating.  When I met him here in the special treatment unit 1 program, he was seen on a wheelchair which was provided to him when he described not being able to walk.  He indicated that this is something that he has had off and on and that in the past he  required to have two spinal taps.  He was around 33 and residing in [place].  However, he was not able to give any other information about it.    When the patient was examined in the emergency room, his neurological exam was described as "patient drowsy but arousable, maintaining airway, oriented x3, however, he seems confused.  Falls back asleep when not being stimulated.  No focal neurological deficit.  With equal strength in all four extremities."    SURGICAL HISTORY:  Apparently negative.    ALLERGIES:  THE PATIENT IS DESCRIBED TO BE ALLERGIC TO AMOXICILLIN  WHICH PRODUCES AN UNKNOWN TYPE OF REACTION.    LABORATORY DATA:  Multiple labs were performed at the time of the patient's evaluation in the emergency department.  They included a CBC with differential which had initially shown white blood cells as high as 26.3 with a deviation of the differential to the left, showing 95% neutrophils.  Absolute neutrophils also elevated to 24,900 per unit liter.  A repeat of the CBC showed to be several hours later almost normal with a white blood cell count reduced to 14.1, still showing some anemia with hemoglobin of 12.8, RBC of 4.39, however, normal hematocrit of 38.3.  Differential shows neutrophils at 74% with 20% lymphocytes.  Other studies included a blood chemistry that showed on 10/30/2018, sodium 137, potassium 3.3, chloride 104, blood sugar 97, BUN 9, creatinine 0.95, estimated GFR above 60 mL/min and normal liver function tests.  CK normal at 300.  Acetaminophen and salicylate levels below range.  Urine drug screen positive for cocaine and cannabis.  SARS-CoV-19 showed negative results from a nasopharyngeal sample.  An electrocardiogram showed a sinus rhythm with a ventricular rate response of 75 beats per minute, QT 380, QTc 424.    ALCOHOL AND DRUG HISTORY:  The patient denies the use of alcohol.  He indicated that cannabis is his drug of choice.  He denies using cocaine on a regular basis.  He indicated he did not know what this friend of his had laced his marijuana with.    PERSONAL HISTORY AND FAMILY HISTORY:  The patient is currently residing with his parents, something that he does not want to do.  He indicated that he does not have a key to the house; however, his sister who does not reside there and lives with her own family "does have a key to come in whenever she wants."  The patient mentioned that he would like to go back to [place], not clear as to why he came back to this area other than perhaps requiring some type of support.  It is not clear if  the patient is working.  He apparently has had a job before.  However, again, with his "neurological problems," he is unable to work regularly.    MENTAL STATUS EXAM:  This is a male who looks his stated age.  During this evaluation, there is no evidence of alcohol or any other type of drug-related signs of intoxication or withdrawal symptoms.  He is coherent, shows quality of continuity of associations without evidence of flight of ideas and/or pressure of speech.  The patient described, however, the presence of ego-dystonic auditory hallucinations which tell him to kill himself at times.  However, he did indicate that he will be able to talk to the staff if unable to control thoughts of self-harm.  He denies wanting to harm anyone else.  Cognition is intact.  Intellectual capacity appears to be average.  Insight and judgment showed both to be limited.  However, the patient knows what he is doing, knows the difference between right and wrong and knows what the consequences from his actions are.    CLINICAL IMPRESSION:  AXIS I:  Schizoaffective disorder, currently depressed subtype.  Cannabis use disorder, severe.  Brief psychotic episode, drug-induced.  AXIS II:  Deferred.  AXIS III:  History of difficulty walking due to lack of muscle strength, the specifics not clear.  History of allergy to amoxicillin.    TREATMENT PLAN:  1.  The patient was admitted to the special treatment unit 1, will be seen daily and will be referred to the groups within context of the program.  2.  The patient will be prescribed with a combination of risperidone which will be increased to 1 mg twice a day and maintained on his current dose of Topamax of 100 mg daily.  3.  The patient will be provided with p.r.n. medications depending upon his degree of agitation and also potential for control loss as currently ordered.  4.  He will have an inpatient case manager assigned to his care.  When the case was discussed with the treatment team  today, we requested the patient's records to be obtained from [facility] in [place], [place].  Hopefully, they will be able to provide [place] with some information in regards to his not only psychiatric history but also his history of chronic neurological problems.    ESTIMATED LENGTH OF STAY AND PROGNOSIS:  ELOS is 5-7 days.  Prognosis will depend upon treatment response and treatment compliance.      Valetta Mole, MD, LFAPA      [id number]  D:  11/01/2018 10:32  T:  11/01/2018 14:24  JOB #:  [id number]

## 2018-10-30 NOTE — ED Provider Notes (Signed)
Alan [name] is a 28 y.o. male coming in with an unknown drug ingestion.  Patient states he was with some friends smoking marijuana and 1 of his friends said he was going to "spice up the blunt."  Patient unsure of what the point was laced with.  Apparently he left the house and was found wandering outside.  Friends had called concerned and he was brought here.  Unclear timing of ingestion.  Patient denies taking any other oral medications or drugs.           No past medical history on file.    No past surgical history on file.      No family history on file.    Social History     Socioeconomic History   ??? Marital status: DIVORCED     Spouse name: Not on file   ??? Number of children: Not on file   ??? Years of education: Not on file   ??? Highest education level: Not on file   Occupational History   ??? Not on file   Social Needs   ??? Financial resource strain: Not on file   ??? Food insecurity     Worry: Not on file     Inability: Not on file   ??? Transportation needs     Medical: Not on file     Non-medical: Not on file   Tobacco Use   ??? Smoking status: Current Every Day Smoker     Packs/day: 1.00   Substance and Sexual Activity   ??? Alcohol use: Yes     Frequency: Never     Comment: 6 pack of beer per day   ??? Drug use: Yes     Types: Marijuana   ??? Sexual activity: Not on file   Lifestyle   ??? Physical activity     Days per week: Not on file     Minutes per session: Not on file   ??? Stress: Not on file   Relationships   ??? Social Product manager on phone: Not on file     Gets together: Not on file     Attends religious service: Not on file     Active member of club or organization: Not on file     Attends meetings of clubs or organizations: Not on file     Relationship status: Not on file   ??? Intimate partner violence     Fear of current or ex partner: Not on file     Emotionally abused: Not on file     Physically abused: Not on file     Forced sexual activity: Not on file   Other Topics Concern   ??? Not on file    Social History Narrative   ??? Not on file         ALLERGIES: Amoxicillin    Review of Systems   Unable to perform ROS: Mental status change       Vitals:    10/30/18 2023 10/31/18 0100   BP: 123/70 106/66   Pulse: 91 76   Resp:  18   Temp: 98.6 ??F (37 ??C) 97.5 ??F (36.4 ??C)   SpO2: 95% 97%   Weight: 81.6 kg (180 lb)    Height: 5' 8"  (1.727 m)             Physical Exam  Vitals signs reviewed.   Constitutional:       Comments: Drowsy but arousable to sternal  rub.  Is cooperative and answers questions.   HENT:      Head: Normocephalic and atraumatic.      Mouth/Throat:      Mouth: Mucous membranes are moist.   Eyes:      Extraocular Movements: Extraocular movements intact.      Pupils: Pupils are equal, round, and reactive to light.   Neck:      Musculoskeletal: Normal range of motion. No neck rigidity.   Cardiovascular:      Rate and Rhythm: Normal rate and regular rhythm.      Heart sounds: No murmur. No friction rub. No gallop.    Pulmonary:      Effort: Pulmonary effort is normal. No respiratory distress.      Breath sounds: Normal breath sounds.   Abdominal:      General: There is no distension.      Palpations: Abdomen is soft.      Tenderness: There is no abdominal tenderness.   Musculoskeletal: Normal range of motion.         General: No tenderness or deformity.   Skin:     General: Skin is warm.   Neurological:      General: No focal deficit present.      Mental Status: He is oriented to person, place, and time.      Comments: Patient drowsy, but arousable.  Maintaining airway.  Oriented x3, but seems confused.  Falls back asleep when not being stimulated.  No focal neurologic deficits equal strength in all 4 extremities.          MDM  Number of Diagnoses or Management Options  Drug intoxication, with perceptual disturbance ([place]):   Leukocytosis, unspecified type:   Diagnosis management comments: Alan [name] is a 28 y.o. male coming in with an unknown illicit drug ingestion.  Drowsy, but arousable.  No  need for emergent intubation at this time.  Will monitor patient, check basic labs, UDS, and alcohol level.  Will check for other coingestions such as Tylenol and aspirin.  Will get screening EKG.    EKG reassuring with normal intervals.  Coingestions negative.  UA positive for cocaine and marijuana.  Patient slept here for a few hours.  Labs reassuring other than a leukocytosis and left shift.  I suspect that this is an acute phase reactant due to acute intoxication and perceptual disturbance.  Patient has been afebrile with normal blood pressure, heart rate, respiratory rate, no other evidence of sepsis or infectious process.  After patient woke up and return to his baseline mental status he was able to eat here.  He denies any abdominal pain, chest pain, headache, neck pain, urinary symptoms, cough, or other concerns.  Chest x-ray is clear.  I advised him on drug cessation and advised him to follow-up with his regular doctor.  Given return precautions for worsening symptoms.         Procedures        Vitals:  Patient Vitals for the past 12 hrs:   Temp Pulse Resp BP SpO2   10/31/18 0100 97.5 ??F (36.4 ??C) 76 18 106/66 97 %   10/30/18 2023 98.6 ??F (37 ??C) 91 ??? 123/70 95 %       Medications ordered:   Medications   sodium chloride 0.9 % bolus infusion 1,000 mL (0 mL IntraVENous IV Completed 10/30/18 2230)         Lab findings:  Recent Results (from the past 12 hour(s))   CBC WITH  AUTOMATED DIFF    Collection Time: 10/30/18  8:18 PM   Result Value Ref Range    WBC 26.3 (H) 4.6 - 13.2 K/uL    RBC 4.36 (L) 4.70 - 5.50 M/uL    HGB 12.9 (L) 13.0 - 16.0 g/dL    HCT 37.1 36.0 - 48.0 %    MCV 85.1 74.0 - 97.0 FL    MCH 29.6 24.0 - 34.0 PG    MCHC 34.8 31.0 - 37.0 g/dL    RDW 13.7 11.6 - 14.5 %    PLATELET 211 135 - 420 K/uL    MPV 11.5 9.2 - 11.8 FL    NEUTROPHILS 95 (H) 42 - 75 %    LYMPHOCYTES 4 (L) 20 - 51 %    MONOCYTES 1 (L) 2 - 9 %    EOSINOPHILS 0 0 - 5 %    BASOPHILS 0 0 - 3 %    ABS. NEUTROPHILS 24.9 (H) 1.8 - 8.0  K/UL    ABS. LYMPHOCYTES 1.1 0.8 - 3.5 K/UL    ABS. MONOCYTES 0.3 0 - 1.0 K/UL    ABS. EOSINOPHILS 0.0 0.0 - 0.4 K/UL    ABS. BASOPHILS 0.0 0.0 - 0.06 K/UL    DF MANUAL      PLATELET COMMENTS ADEQUATE PLATELETS      RBC COMMENTS NORMOCYTIC, NORMOCHROMIC     METABOLIC PANEL, COMPREHENSIVE    Collection Time: 10/30/18  8:18 PM   Result Value Ref Range    Sodium 137 136 - 145 mmol/L    Potassium 3.3 (L) 3.5 - 5.5 mmol/L    Chloride 104 100 - 111 mmol/L    CO2 25 21 - 32 mmol/L    Anion gap 8 3.0 - 18 mmol/L    Glucose 97 74 - 99 mg/dL    BUN 9 7.0 - 18 MG/DL    Creatinine 0.95 0.6 - 1.3 MG/DL    BUN/Creatinine ratio 9 (L) 12 - 20      GFR est AA >60 >60 ml/min/1.52m    GFR est non-AA >60 >60 ml/min/1.755m   Calcium 9.3 8.5 - 10.1 MG/DL    Bilirubin, total 0.6 0.2 - 1.0 MG/DL    ALT (SGPT) 23 16 - 61 U/L    AST (SGOT) 29 10 - 38 U/L    Alk. phosphatase 53 45 - 117 U/L    Protein, total 8.1 6.4 - 8.2 g/dL    Albumin 3.9 3.4 - 5.0 g/dL    Globulin 4.2 (H) 2.0 - 4.0 g/dL    A-G Ratio 0.9 0.8 - 1.7     MAGNESIUM    Collection Time: 10/30/18  8:18 PM   Result Value Ref Range    Magnesium 1.9 1.6 - 2.6 mg/dL   ETHYL ALCOHOL    Collection Time: 10/30/18  8:18 PM   Result Value Ref Range    ALCOHOL(ETHYL),SERUM <3 0 - 3 MG/DL   SALICYLATE    Collection Time: 10/30/18  8:18 PM   Result Value Ref Range    Salicylate level 2.2 (L) 2.8 - 20.0 MG/DL   ACETAMINOPHEN    Collection Time: 10/30/18  8:18 PM   Result Value Ref Range    Acetaminophen level <2 (L) 10.0 - 30.0 ug/mL   CK    Collection Time: 10/30/18  8:18 PM   Result Value Ref Range    CK 308 39 - 308 U/L   EKG, 12 LEAD, INITIAL    Collection Time: 10/30/18  10:27 PM   Result Value Ref Range    Ventricular Rate 75 BPM    Atrial Rate 75 BPM    P-R Interval 172 ms    QRS Duration 92 ms    Q-T Interval 380 ms    QTC Calculation (Bezet) 424 ms    Calculated P Axis 52 degrees    Calculated R Axis 78 degrees    Calculated T Axis 28 degrees    Diagnosis       Normal sinus  rhythm  Normal ECG  No previous ECGs available     DRUG SCREEN, URINE    Collection Time: 10/31/18 12:50 AM   Result Value Ref Range    BENZODIAZEPINES Negative NEG      BARBITURATES Negative NEG      THC (TH-CANNABINOL) Positive (A) NEG      OPIATES Negative NEG      PCP(PHENCYCLIDINE) Negative NEG      COCAINE Positive (A) NEG      AMPHETAMINES Negative NEG      METHADONE Negative NEG      HDSCOM (NOTE)        EKG interpretation by ED Physician: Sinus rhythm rate of 75 bpm.  No acute ischemic changes.    X-Ray, CT or other radiology findings or impressions:  XR CHEST PA LAT    (Results Pending)   No acute process.      Disposition:  Diagnosis:   1. Drug intoxication, with perceptual disturbance ([place])    2. Leukocytosis, unspecified type        Disposition: Discharge    Follow-up Information     Follow up With Specialties Details Why Contact Info    Your primary doctor  Schedule an appointment as soon as possible for a visit for office follow up     [facility] DEPT Emergency Medicine  As needed, If symptoms worsen [address] [place] [address]  [phone]           Patient's Medications   Start Taking    No medications on file   Continue Taking    RISPERIDONE (RISPERDAL) 4 MG TABLET    Take 4 mg by mouth.   These Medications have changed    No medications on file   Stop Taking    No medications on file
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
## 2018-10-30 NOTE — ED Notes (Signed)
Here patient is sleepy but arousable and is oriented when awakened he just states that he had thoughts of suicide but no plan.  He contracts for safety while here.

## 2018-10-30 NOTE — ED Provider Notes (Signed)
Alan [name] is a 28 y.o. male coming in with an unknown drug ingestion.  Patient states he was with some friends smoking marijuana and 1 of his friends said he was going to "spice up the blunt."  Patient unsure of what the point was laced with.  Apparently he left the house and was found wandering outside.  Friends had called concerned and he was brought here.  Unclear timing of ingestion.  Patient denies taking any other oral medications or drugs.           No past medical history on file.    No past surgical history on file.      No family history on file.    Social History     Socioeconomic History   ??? Marital status: DIVORCED     Spouse name: Not on file   ??? Number of children: Not on file   ??? Years of education: Not on file   ??? Highest education level: Not on file   Occupational History   ??? Not on file   Social Needs   ??? Financial resource strain: Not on file   ??? Food insecurity     Worry: Not on file     Inability: Not on file   ??? Transportation needs     Medical: Not on file     Non-medical: Not on file   Tobacco Use   ??? Smoking status: Current Every Day Smoker     Packs/day: 1.00   Substance and Sexual Activity   ??? Alcohol use: Yes     Frequency: Never     Comment: 6 pack of beer per day   ??? Drug use: Yes     Types: Marijuana   ??? Sexual activity: Not on file   Lifestyle   ??? Physical activity     Days per week: Not on file     Minutes per session: Not on file   ??? Stress: Not on file   Relationships   ??? Social Product manager on phone: Not on file     Gets together: Not on file     Attends religious service: Not on file     Active member of club or organization: Not on file     Attends meetings of clubs or organizations: Not on file     Relationship status: Not on file   ??? Intimate partner violence     Fear of current or ex partner: Not on file     Emotionally abused: Not on file     Physically abused: Not on file     Forced sexual activity: Not on file   Other Topics Concern   ??? Not on file    Social History Narrative   ??? Not on file         ALLERGIES: Amoxicillin    Review of Systems   Unable to perform ROS: Mental status change       Vitals:    10/30/18 2023 10/31/18 0100   BP: 123/70 106/66   Pulse: 91 76   Resp:  18   Temp: 98.6 ??F (37 ??C) 97.5 ??F (36.4 ??C)   SpO2: 95% 97%   Weight: 81.6 kg (180 lb)    Height: 5' 8"  (1.727 m)             Physical Exam  Vitals signs reviewed.   Constitutional:       Comments: Drowsy but arousable to sternal  rub.  Is cooperative and answers questions.   HENT:      Head: Normocephalic and atraumatic.      Mouth/Throat:      Mouth: Mucous membranes are moist.   Eyes:      Extraocular Movements: Extraocular movements intact.      Pupils: Pupils are equal, round, and reactive to light.   Neck:      Musculoskeletal: Normal range of motion. No neck rigidity.   Cardiovascular:      Rate and Rhythm: Normal rate and regular rhythm.      Heart sounds: No murmur. No friction rub. No gallop.    Pulmonary:      Effort: Pulmonary effort is normal. No respiratory distress.      Breath sounds: Normal breath sounds.   Abdominal:      General: There is no distension.      Palpations: Abdomen is soft.      Tenderness: There is no abdominal tenderness.   Musculoskeletal: Normal range of motion.         General: No tenderness or deformity.   Skin:     General: Skin is warm.   Neurological:      General: No focal deficit present.      Mental Status: He is oriented to person, place, and time.      Comments: Patient drowsy, but arousable.  Maintaining airway.  Oriented x3, but seems confused.  Falls back asleep when not being stimulated.  No focal neurologic deficits equal strength in all 4 extremities.          MDM  Number of Diagnoses or Management Options  Drug intoxication, with perceptual disturbance ([place]):   Leukocytosis, unspecified type:   Diagnosis management comments: Alan [name] is a 28 y.o. male coming in with an unknown illicit drug ingestion.  Drowsy, but arousable.  No  need for emergent intubation at this time.  Will monitor patient, check basic labs, UDS, and alcohol level.  Will check for other coingestions such as Tylenol and aspirin.  Will get screening EKG.    EKG reassuring with normal intervals.  Coingestions negative.  UA positive for cocaine and marijuana.  Patient slept here for a few hours.  Labs reassuring other than a leukocytosis and left shift.  I suspect that this is an acute phase reactant due to acute intoxication and perceptual disturbance.  Patient has been afebrile with normal blood pressure, heart rate, respiratory rate, no other evidence of sepsis or infectious process.  After patient woke up and return to his baseline mental status he was able to eat here.  He denies any abdominal pain, chest pain, headache, neck pain, urinary symptoms, cough, or other concerns.  Chest x-ray is clear.  I advised him on drug cessation and advised him to follow-up with his regular doctor.  Given return precautions for worsening symptoms.         Procedures        Vitals:  Patient Vitals for the past 12 hrs:   Temp Pulse Resp BP SpO2   10/31/18 0100 97.5 ??F (36.4 ??C) 76 18 106/66 97 %   10/30/18 2023 98.6 ??F (37 ??C) 91 ??? 123/70 95 %       Medications ordered:   Medications   sodium chloride 0.9 % bolus infusion 1,000 mL (0 mL IntraVENous IV Completed 10/30/18 2230)         Lab findings:  Recent Results (from the past 12 hour(s))   CBC WITH  AUTOMATED DIFF    Collection Time: 10/30/18  8:18 PM   Result Value Ref Range    WBC 26.3 (H) 4.6 - 13.2 K/uL    RBC 4.36 (L) 4.70 - 5.50 M/uL    HGB 12.9 (L) 13.0 - 16.0 g/dL    HCT 37.1 36.0 - 48.0 %    MCV 85.1 74.0 - 97.0 FL    MCH 29.6 24.0 - 34.0 PG    MCHC 34.8 31.0 - 37.0 g/dL    RDW 13.7 11.6 - 14.5 %    PLATELET 211 135 - 420 K/uL    MPV 11.5 9.2 - 11.8 FL    NEUTROPHILS 95 (H) 42 - 75 %    LYMPHOCYTES 4 (L) 20 - 51 %    MONOCYTES 1 (L) 2 - 9 %    EOSINOPHILS 0 0 - 5 %    BASOPHILS 0 0 - 3 %     ABS. NEUTROPHILS 24.9 (H) 1.8 - 8.0 K/UL    ABS. LYMPHOCYTES 1.1 0.8 - 3.5 K/UL    ABS. MONOCYTES 0.3 0 - 1.0 K/UL    ABS. EOSINOPHILS 0.0 0.0 - 0.4 K/UL    ABS. BASOPHILS 0.0 0.0 - 0.06 K/UL    DF MANUAL      PLATELET COMMENTS ADEQUATE PLATELETS      RBC COMMENTS NORMOCYTIC, NORMOCHROMIC     METABOLIC PANEL, COMPREHENSIVE    Collection Time: 10/30/18  8:18 PM   Result Value Ref Range    Sodium 137 136 - 145 mmol/L    Potassium 3.3 (L) 3.5 - 5.5 mmol/L    Chloride 104 100 - 111 mmol/L    CO2 25 21 - 32 mmol/L    Anion gap 8 3.0 - 18 mmol/L    Glucose 97 74 - 99 mg/dL    BUN 9 7.0 - 18 MG/DL    Creatinine 0.95 0.6 - 1.3 MG/DL    BUN/Creatinine ratio 9 (L) 12 - 20      GFR est AA >60 >60 ml/min/1.27m    GFR est non-AA >60 >60 ml/min/1.732m   Calcium 9.3 8.5 - 10.1 MG/DL    Bilirubin, total 0.6 0.2 - 1.0 MG/DL    ALT (SGPT) 23 16 - 61 U/L    AST (SGOT) 29 10 - 38 U/L    Alk. phosphatase 53 45 - 117 U/L    Protein, total 8.1 6.4 - 8.2 g/dL    Albumin 3.9 3.4 - 5.0 g/dL    Globulin 4.2 (H) 2.0 - 4.0 g/dL    A-G Ratio 0.9 0.8 - 1.7     MAGNESIUM    Collection Time: 10/30/18  8:18 PM   Result Value Ref Range    Magnesium 1.9 1.6 - 2.6 mg/dL   ETHYL ALCOHOL    Collection Time: 10/30/18  8:18 PM   Result Value Ref Range    ALCOHOL(ETHYL),SERUM <3 0 - 3 MG/DL   SALICYLATE    Collection Time: 10/30/18  8:18 PM   Result Value Ref Range    Salicylate level 2.2 (L) 2.8 - 20.0 MG/DL   ACETAMINOPHEN    Collection Time: 10/30/18  8:18 PM   Result Value Ref Range    Acetaminophen level <2 (L) 10.0 - 30.0 ug/mL   CK    Collection Time: 10/30/18  8:18 PM   Result Value Ref Range    CK 308 39 - 308 U/L   EKG, 12 LEAD, INITIAL    Collection Time: 10/30/18  10:27 PM   Result Value Ref Range    Ventricular Rate 75 BPM    Atrial Rate 75 BPM    P-R Interval 172 ms    QRS Duration 92 ms    Q-T Interval 380 ms    QTC Calculation (Bezet) 424 ms    Calculated P Axis 52 degrees    Calculated R Axis 78 degrees    Calculated T Axis 28 degrees     Diagnosis       Normal sinus rhythm  Normal ECG  No previous ECGs available     DRUG SCREEN, URINE    Collection Time: 10/31/18 12:50 AM   Result Value Ref Range    BENZODIAZEPINES Negative NEG      BARBITURATES Negative NEG      THC (TH-CANNABINOL) Positive (A) NEG      OPIATES Negative NEG      PCP(PHENCYCLIDINE) Negative NEG      COCAINE Positive (A) NEG      AMPHETAMINES Negative NEG      METHADONE Negative NEG      HDSCOM (NOTE)        EKG interpretation by ED Physician: Sinus rhythm rate of 75 bpm.  No acute ischemic changes.    X-Ray, CT or other radiology findings or impressions:  XR CHEST PA LAT    (Results Pending)   No acute process.      Disposition:  Diagnosis:   1. Drug intoxication, with perceptual disturbance ([place])    2. Leukocytosis, unspecified type        Disposition: Discharge    Follow-up Information     Follow up With Specialties Details Why Contact Info    Your primary doctor  Schedule an appointment as soon as possible for a visit for office follow up     [facility] DEPT Emergency Medicine  As needed, If symptoms worsen [address] [place] [address]  [phone]           Patient's Medications   Start Taking    No medications on file   Continue Taking    RISPERIDONE (RISPERDAL) 4 MG TABLET    Take 4 mg by mouth.   These Medications have changed    No medications on file   Stop Taking    No medications on file

## 2018-10-30 NOTE — ED Triage Notes (Signed)
Patient was brought in by medics who were called to the scene by bystander who stated that the patient was trying to hurt himself patient was observed wandering around with a box cutter and allegedly cut himself no lacerations have been observed on the patient, the medics stated that the patient himself told them that he smoked some weed and feels like it was laced with something because of how he felt afterward.

## 2018-10-31 ENCOUNTER — Inpatient Hospital Stay
Admit: 2018-10-31 | Discharge: 2018-11-07 | Disposition: A | Discharge status: Rehabilitation Facility | Payer: MEDICAID | Attending: Psychiatry | Admitting: Psychiatry

## 2018-10-31 ENCOUNTER — Emergency Department: Admit: 2018-10-31 | Payer: MEDICAID

## 2018-10-31 LAB — METABOLIC PANEL, COMPREHENSIVE
A-G Ratio: 0.9 (ref 0.8–1.7)
ALT (SGPT): 23 U/L (ref 16–61)
AST (SGOT): 29 U/L (ref 10–38)
Albumin: 3.9 g/dL (ref 3.4–5.0)
Alk. phosphatase: 53 U/L (ref 45–117)
Anion gap: 8 mmol/L (ref 3.0–18)
BUN/Creatinine ratio: 9 — ABNORMAL LOW (ref 12–20)
BUN: 9 MG/DL (ref 7.0–18)
Bilirubin, total: 0.6 MG/DL (ref 0.2–1.0)
CO2: 25 mmol/L (ref 21–32)
Calcium: 9.3 MG/DL (ref 8.5–10.1)
Chloride: 104 mmol/L (ref 100–111)
Creatinine: 0.95 MG/DL (ref 0.6–1.3)
GFR est AA: >60 mL/min/{1.73_m2} (ref >60)
GFR est non-AA: >60 mL/min/{1.73_m2} (ref >60)
Globulin: 4.2 g/dL — ABNORMAL HIGH (ref 2.0–4.0)
Glucose: 97 mg/dL (ref 74–99)
Potassium: 3.3 mmol/L — ABNORMAL LOW (ref 3.5–5.5)
Protein, total: 8.1 g/dL (ref 6.4–8.2)
Sodium: 137 mmol/L (ref 136–145)

## 2018-10-31 LAB — CBC WITH AUTOMATED DIFF
ABS. BASOPHILS: 0 10*3/uL (ref 0.0–0.06)
ABS. BASOPHILS: 0 10*3/uL (ref 0.0–0.1)
ABS. EOSINOPHILS: 0 10*3/uL (ref 0.0–0.4)
ABS. EOSINOPHILS: 0.2 10*3/uL (ref 0.0–0.4)
ABS. LYMPHOCYTES: 1.1 10*3/uL (ref 0.8–3.5)
ABS. LYMPHOCYTES: 2.9 10*3/uL (ref 0.9–3.6)
ABS. MONOCYTES: 0.3 10*3/uL (ref 0–1.0)
ABS. MONOCYTES: 0.6 10*3/uL (ref 0.05–1.2)
ABS. NEUTROPHILS: 24.9 10*3/uL — ABNORMAL HIGH (ref 1.8–8.0)
ABS. NEUTROPHILS: 10.4 10*3/uL — ABNORMAL HIGH (ref 1.8–8.0)
BASOPHILS: 0 % (ref 0–3)
BASOPHILS: 0 % (ref 0–2)
EOSINOPHILS: 0 % (ref 0–5)
EOSINOPHILS: 2 % (ref 0–5)
HCT: 37.1 % (ref 36.0–48.0)
HCT: 38.3 % (ref 36.0–48.0)
HGB: 12.9 g/dL — ABNORMAL LOW (ref 13.0–16.0)
HGB: 12.8 g/dL — ABNORMAL LOW (ref 13.0–16.0)
LYMPHOCYTES: 4 % — ABNORMAL LOW (ref 20–51)
LYMPHOCYTES: 20 % — ABNORMAL LOW (ref 21–52)
MCH: 29.6 PG (ref 24.0–34.0)
MCH: 29.2 PG (ref 24.0–34.0)
MCHC: 34.8 g/dL (ref 31.0–37.0)
MCHC: 33.4 g/dL (ref 31.0–37.0)
MCV: 85.1 FL (ref 74.0–97.0)
MCV: 87.2 FL (ref 74.0–97.0)
MONOCYTES: 1 % — ABNORMAL LOW (ref 2–9)
MONOCYTES: 4 % (ref 3–10)
MPV: 11.5 FL (ref 9.2–11.8)
MPV: 11.6 FL (ref 9.2–11.8)
NEUTROPHILS: 95 % — ABNORMAL HIGH (ref 42–75)
NEUTROPHILS: 74 % — ABNORMAL HIGH (ref 40–73)
PLATELET COMMENTS: ADEQUATE
PLATELET: 211 10*3/uL (ref 135–420)
PLATELET: 196 10*3/uL (ref 135–420)
RBC: 4.36 M/uL — ABNORMAL LOW (ref 4.70–5.50)
RBC: 4.39 M/uL — ABNORMAL LOW (ref 4.70–5.50)
RDW: 13.7 % (ref 11.6–14.5)
RDW: 14.1 % (ref 11.6–14.5)
WBC: 26.3 10*3/uL — ABNORMAL HIGH (ref 4.6–13.2)
WBC: 14.1 10*3/uL — ABNORMAL HIGH (ref 4.6–13.2)

## 2018-10-31 LAB — ETHYL ALCOHOL
ALCOHOL(ETHYL),SERUM: <3 MG/DL (ref 0–3)
Ethyl Alcohol: <3 MG/DL (ref 0–3)

## 2018-10-31 LAB — DRUG SCREEN, URINE
AMPHETAMINES: NEGATIVE
Amphetamine Screen, Urine: NEGATIVE
BARBITURATES: NEGATIVE
BENZODIAZEPINES: NEGATIVE
Barbiturate Screen, Urine: NEGATIVE
Benzodiazepine Screen, Urine: NEGATIVE
COCAINE: POSITIVE — AB
Cocaine Screen Urine: POSITIVE — AB
METHADONE: NEGATIVE
Methadone Screen, Urine: NEGATIVE
OPIATES: NEGATIVE
Opiate Screen, Urine: NEGATIVE
PCP Screen, Urine: NEGATIVE
PCP(PHENCYCLIDINE): NEGATIVE
THC (TH-CANNABINOL): POSITIVE — AB
THC Screen, Urine: POSITIVE — AB

## 2018-10-31 LAB — SARS-COV-2: COVID-19 rapid test: NOT DETECTED

## 2018-10-31 LAB — MAGNESIUM
Magnesium: 1.9 mg/dL (ref 1.6–2.6)
Magnesium: 1.9 mg/dL (ref 1.6–2.6)

## 2018-10-31 LAB — SALICYLATE
Salicylate level: 2.2 MG/DL — ABNORMAL LOW (ref 2.8–20.0)
Salicylate: 2.2 MG/DL — ABNORMAL LOW (ref 2.8–20.0)

## 2018-10-31 LAB — ACETAMINOPHEN: Acetaminophen level: <2 ug/mL — ABNORMAL LOW (ref 10.0–30.0)

## 2018-10-31 LAB — CK
CK: 308 U/L (ref 39–308)
Total CK: 308 U/L (ref 39–308)

## 2018-10-31 LAB — CBC WITH AUTO DIFFERENTIAL
Basophils %: 0 % (ref 0–3)
Basophils %: 0 % (ref 0–2)
Basophils Absolute: 0 10*3/uL (ref 0.0–0.06)
Basophils Absolute: 0 10*3/uL (ref 0.0–0.1)
Eosinophils %: 0 % (ref 0–5)
Eosinophils %: 2 % (ref 0–5)
Eosinophils Absolute: 0 10*3/uL (ref 0.0–0.4)
Eosinophils Absolute: 0.2 10*3/uL (ref 0.0–0.4)
Hematocrit: 37.1 % (ref 36.0–48.0)
Hematocrit: 38.3 % (ref 36.0–48.0)
Hemoglobin: 12.9 g/dL — ABNORMAL LOW (ref 13.0–16.0)
Hemoglobin: 12.8 g/dL — ABNORMAL LOW (ref 13.0–16.0)
Lymphocytes %: 4 % — ABNORMAL LOW (ref 20–51)
Lymphocytes %: 20 % — ABNORMAL LOW (ref 21–52)
Lymphocytes Absolute: 1.1 10*3/uL (ref 0.8–3.5)
Lymphocytes Absolute: 2.9 10*3/uL (ref 0.9–3.6)
MCH: 29.6 PG (ref 24.0–34.0)
MCH: 29.2 PG (ref 24.0–34.0)
MCHC: 34.8 g/dL (ref 31.0–37.0)
MCHC: 33.4 g/dL (ref 31.0–37.0)
MCV: 85.1 FL (ref 74.0–97.0)
MCV: 87.2 FL (ref 74.0–97.0)
MPV: 11.5 FL (ref 9.2–11.8)
MPV: 11.6 FL (ref 9.2–11.8)
Monocytes %: 1 % — ABNORMAL LOW (ref 2–9)
Monocytes %: 4 % (ref 3–10)
Monocytes Absolute: 0.3 10*3/uL (ref 0–1.0)
Monocytes Absolute: 0.6 10*3/uL (ref 0.05–1.2)
Neutrophils %: 95 % — ABNORMAL HIGH (ref 42–75)
Neutrophils %: 74 % — ABNORMAL HIGH (ref 40–73)
Neutrophils Absolute: 24.9 10*3/uL — ABNORMAL HIGH (ref 1.8–8.0)
Neutrophils Absolute: 10.4 10*3/uL — ABNORMAL HIGH (ref 1.8–8.0)
Platelet Comment: ADEQUATE
Platelets: 211 10*3/uL (ref 135–420)
Platelets: 196 10*3/uL (ref 135–420)
RBC: 4.36 M/uL — ABNORMAL LOW (ref 4.70–5.50)
RBC: 4.39 M/uL — ABNORMAL LOW (ref 4.70–5.50)
RDW: 13.7 % (ref 11.6–14.5)
RDW: 14.1 % (ref 11.6–14.5)
WBC: 26.3 10*3/uL — ABNORMAL HIGH (ref 4.6–13.2)
WBC: 14.1 10*3/uL — ABNORMAL HIGH (ref 4.6–13.2)

## 2018-10-31 LAB — COVID-19: SARS-CoV-2, Rapid: NOT DETECTED

## 2018-10-31 LAB — COMPREHENSIVE METABOLIC PANEL
ALT: 23 U/L (ref 16–61)
AST: 29 U/L (ref 10–38)
Albumin/Globulin Ratio: 0.9 (ref 0.8–1.7)
Albumin: 3.9 g/dL (ref 3.4–5.0)
Alkaline Phosphatase: 53 U/L (ref 45–117)
Anion Gap: 8 mmol/L (ref 3.0–18)
BUN: 9 MG/DL (ref 7.0–18)
Bun/Cre Ratio: 9 — ABNORMAL LOW (ref 12–20)
CO2: 25 mmol/L (ref 21–32)
Calcium: 9.3 MG/DL (ref 8.5–10.1)
Chloride: 104 mmol/L (ref 100–111)
Creatinine: 0.95 MG/DL (ref 0.6–1.3)
EGFR IF NonAfrican American: >60 mL/min/{1.73_m2} (ref >60)
GFR African American: >60 mL/min/{1.73_m2} (ref >60)
Globulin: 4.2 g/dL — ABNORMAL HIGH (ref 2.0–4.0)
Glucose: 97 mg/dL (ref 74–99)
Potassium: 3.3 mmol/L — ABNORMAL LOW (ref 3.5–5.5)
Sodium: 137 mmol/L (ref 136–145)
Total Bilirubin: 0.6 MG/DL (ref 0.2–1.0)
Total Protein: 8.1 g/dL (ref 6.4–8.2)

## 2018-10-31 LAB — ACETAMINOPHEN LEVEL: Acetaminophen Level: <2 ug/mL — ABNORMAL LOW (ref 10.0–30.0)

## 2018-10-31 MED ORDER — LORAZEPAM 2 MG/ML IJ SOLN
2 mg/mL | Freq: Four times a day (QID) | INTRAMUSCULAR | Status: DC | PRN
Start: 2018-10-31 — End: 2018-11-07

## 2018-10-31 MED ORDER — BENZTROPINE 1 MG/ML IJ SOLN
1 mg/mL | Freq: Four times a day (QID) | INTRAMUSCULAR | Status: DC | PRN
Start: 2018-10-31 — End: 2018-11-07

## 2018-10-31 MED ORDER — HYDROXYZINE PAMOATE 50 MG CAP
50 mg | Freq: Three times a day (TID) | ORAL | Status: DC | PRN
Start: 2018-10-31 — End: 2018-11-07
  Administered 2018-11-01 – 2018-11-07 (×5): via ORAL

## 2018-10-31 MED ORDER — RISPERIDONE 1 MG TAB
1 mg | Freq: Every evening | ORAL | Status: DC
Start: 2018-10-31 — End: 2018-11-01
  Administered 2018-11-01: via ORAL

## 2018-10-31 MED ORDER — BENZTROPINE 1 MG TAB
1 mg | Freq: Four times a day (QID) | ORAL | Status: DC | PRN
Start: 2018-10-31 — End: 2018-11-07

## 2018-10-31 MED ORDER — SODIUM CHLORIDE 0.9% BOLUS IV
0.9 % | Freq: Once | INTRAVENOUS | Status: AC
Start: 2018-10-31 — End: 2018-10-30
  Administered 2018-10-31: 02:00:00 via INTRAVENOUS

## 2018-10-31 MED ORDER — HALOPERIDOL LACTATE 5 MG/ML IJ SOLN
5 mg/mL | Freq: Four times a day (QID) | INTRAMUSCULAR | Status: DC | PRN
Start: 2018-10-31 — End: 2018-11-07

## 2018-10-31 MED ORDER — HALOPERIDOL 5 MG TAB
5 mg | Freq: Four times a day (QID) | ORAL | Status: DC | PRN
Start: 2018-10-31 — End: 2018-11-07
  Administered 2018-11-01: 21:00:00 via ORAL

## 2018-10-31 MED ORDER — TRAZODONE 50 MG TAB
50 mg | Freq: Every evening | ORAL | Status: DC | PRN
Start: 2018-10-31 — End: 2018-11-07
  Administered 2018-11-01 – 2018-11-07 (×4): via ORAL

## 2018-10-31 MED ORDER — TOPIRAMATE 25 MG TAB
25 mg | Freq: Every evening | ORAL | Status: DC
Start: 2018-10-31 — End: 2018-11-07
  Administered 2018-11-01 – 2018-11-07 (×7): via ORAL

## 2018-10-31 MED FILL — SODIUM CHLORIDE 0.9 % IV: INTRAVENOUS | Qty: 1000

## 2018-10-31 NOTE — ED Notes (Signed)
Patient now awake able to ambulate to bathroom and provide urine sample, requesting something to eat.

## 2018-10-31 NOTE — ED Notes (Signed)
After being discharged and placed in lobby, pt reports hearing voices and does not feel safe around other people.  Pt states he just wants the voices to stop.

## 2018-10-31 NOTE — ED Notes (Signed)
Assisting primary nurse with patient care. Researching chart for report. Patient remains in bed, no acute distress noted. Denies intolerable pain. Needs met.

## 2018-10-31 NOTE — Progress Notes (Signed)
 Chaplain conducted an initial consultation and Spiritual Assessment for Alan [name], who is a 27 y.o.,male. Patient's Primary Language is: [place].   According to the patient's EMR Religious Affiliation is: No religion.     The reason the Patient came to the hospital is:   Patient Active Problem List    Diagnosis Date Noted   . Schizoaffective disorder, bipolar type (HCC) 10/31/2018        The Chaplain provided the following Interventions:  Patient is alone, tired, and he says that he may kill himself. Even though we prayed together, I am concerned about this patient.    Initiated a relationship of care and support.     Listened empathically.  Provided chaplaincy education.  Provided information about Spiritual Care Services.  Offered prayer and assurance of continued prayers on patient's behalf.   Chart reviewed.    The following outcomes where achieved:  Patient shared limited information about both their medical narrative and spiritual journey/beliefs.  Chaplain confirmed Patient's Religious Affiliation.  Patient processed feeling about current hospitalization.  Patient expressed gratitude for chaplain's visit.    Assessment:  Patient does not have any religious/cultural needs that will affect patient's preferences in health care.  There are no spiritual or religious issues which require intervention at this time.     Plan:  Chaplains will continue to follow and will provide pastoral care on an as needed/requested basis.    Chaplain recommends bedside caregivers page chaplain on duty if patient shows signs of acute spiritual or emotional distress.    Chaplain Elspeth Alert  Spiritual Care   [phone]

## 2018-10-31 NOTE — Behavioral Health Treatment Team (Signed)
 Patient admitted to STU-1 via wheelchair from [facility] ED, accompanied by hospital security and ED staff.  Patient noted with angry affect;  when greeted and asked his reason for admission patient responded, I want to die. I have tried so many times and it just won't happen.  Patient states he has been depressed for a long time, is hearing voices and is seeing visual  hallucinations.  Patient states he was given a blunt and the guy told me he was going to beef it up for me but he put something in it. He states he has four children but has no communication with them.  Patient states he lives with his parents but they aren't supportive.  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  Patient has a history of cutting with healed scarring noted on left arm and leg.  He appears dishevelled with dirty clothing.  Patient ambulated a short distance once on the unit and stated, I need a wheelchair.  Patient states he has stage 3 kidney disease and states he has had weakness in his legs since having a lumbar puncture that was done when he was 28 yrs old. Patient provided a walker and a wheelchair until evaluation by MD in the am.  Patient reports he has been non compliant with medicines because nothing helps.  Cooperative with assessment.  Contracts for safety on unit.  Took due medications as offered.  Oriented to unit policies and to assigned room.  RNs will initiate, develop, implement, review or revise treatment plan.

## 2018-10-31 NOTE — ED Notes (Signed)
 TRANSFER - OUT REPORT:    Verbal report given to Primary Care Nurse (name) on Nashaun Fizer  being transferred to Behavioral Medicine (unit) for routine progression of care       Report consisted of patient's Situation, Background, Assessment and   Recommendations(SBAR).     Information from the following report(s) ED Summary was reviewed with the receiving nurse.    Lines:       Opportunity for questions and clarification was provided.      Patient transported with ER Nurse and Security. Patients belongs are in a bag, they consist of x2 knives, wallet, lighters x2, cell phone, and [place]  ID.

## 2018-10-31 NOTE — ED Notes (Signed)
PT has bag with phone, ID, 2 lighters and 2 knives at nurses station.

## 2018-10-31 NOTE — ED Notes (Signed)
Patient laying in bed alert and oriented. He states that the last time he had issues with his legs it was during a high anxiety time for him. He states it

## 2018-10-31 NOTE — ED Notes (Signed)
Patient able to eat some crackers and drink a soda, with no problem.

## 2018-10-31 NOTE — ED Notes (Signed)
28 year old presents for homicidal ideation and psychosis.  Patient was seen earlier actually discharged and now requests to be reevaluated for his mental status.  Patient states he needs psych meds and he does not have any suicidal ideation but wants to hurt others.  Discussed with crisis his blood work is noted for leukocytosis of 26 he is without complaint at this time will recheck it.  Are noted for mild hypokalemia no repletion at 3.3 UDS positive for cocaine and THC  cxr napd     9:28 AM crisis to see.   Marland Kitchen

## 2018-10-31 NOTE — Behavioral Health Treatment Team (Signed)
Patient admitted to STU[facility]1 via wheelchair from [facility] ED, accompanied by hospital security and ED staff.  Patient noted with angry affect;  when greeted and asked his reason for admission patient responded, "I want to die. I have tried so many times and it just won't happen."  Patient states he has been depressed for a long time, is hearing voices and is seeing visual  hallucinations.  Patient states he was given a "blunt and the guy told me he was going to beef it up for me but he put something in it." He states he has four children but has no communication with them.  Patient states he lives with his parents but they aren't supportive.  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  Patient has a history of cutting with healed scarring noted on left arm and leg.  He appears dishevelled with dirty clothing.  Patient ambulated a short distance once on the unit and stated, "I need a wheelchair".  Patient states he has stage 3 kidney disease and states he has had weakness in his legs since having a lumbar puncture that was done when he was 28 yrs old. Patient provided a walker and a wheelchair until evaluation by MD in the am.  Patient reports he has been non compliant with medicines because "nothing helps".  Cooperative with assessment.  Contracts for safety on unit.  Took due medications as offered.  Oriented to unit policies and to assigned room.  RNs will initiate, develop, implement, review or revise treatment plan.

## 2018-10-31 NOTE — ED Notes (Signed)
After being discharged and placed in lobby, pt reports hearing voices and does not feel safe around other people.  Pt states he just wants the voices to stop.

## 2018-10-31 NOTE — ED Notes (Signed)
27-year-old presents for homicidal ideation and psychosis.  Patient was seen earlier actually discharged and now requests to be reevaluated for his mental status.  Patient states he needs psych meds and he does not have any suicidal ideation but wants to hurt others.  Discussed with crisis his blood work is noted for leukocytosis of 26 he is without complaint at this time will recheck it.  Are noted for mild hypokalemia no repletion at 3.3 UDS positive for cocaine and THC  cxr napd     9:28 AM crisis to see.   .

## 2018-10-31 NOTE — Progress Notes (Signed)
Chaplain conducted an initial consultation and Spiritual Assessment for Alan [name], who is a 27 y.o.,male. Patient's Primary Language is: English.   According to the patient's EMR Religious Affiliation is: No religion.     The reason the Patient came to the hospital is:   Patient Active Problem List    Diagnosis Date Noted   ??? Schizoaffective disorder, bipolar type (HCC) 10/31/2018        The Chaplain provided the following Interventions:  Patient is alone, tired, and he says that he may kill himself. Even though we prayed together, I am concerned about this patient.    Initiated a relationship of care and support.     Listened empathically.  Provided chaplaincy education.  Provided information about Spiritual Care Services.  Offered prayer and assurance of continued prayers on patient's behalf.   Chart reviewed.    The following outcomes where achieved:  Patient shared limited information about both their medical narrative and spiritual journey/beliefs.  Chaplain confirmed Patient's Religious Affiliation.  Patient processed feeling about current hospitalization.  Patient expressed gratitude for chaplain's visit.    Assessment:  Patient does not have any religious/cultural needs that will affect patient's preferences in health care.  There are no spiritual or religious issues which require intervention at this time.     Plan:  Chaplains will continue to follow and will provide pastoral care on an as needed/requested basis.    Chaplain recommends bedside caregivers page chaplain on duty if patient shows signs of acute spiritual or emotional distress.    Chaplain Steven Pierce  Spiritual Care   [phone]

## 2018-10-31 NOTE — ED Notes (Signed)
PT has bag with phone, ID, 2 lighters and 2 knives at nurses station.

## 2018-10-31 NOTE — ED Notes (Signed)
Patient now awake able to ambulate to bathroom and provide urine sample, requesting something to eat.

## 2018-10-31 NOTE — ED Notes (Signed)
Patient able to eat some crackers and drink a soda, with no problem.

## 2018-10-31 NOTE — Other (Addendum)
Patient is a 28 y/o male interviewed in [facility] at the request of Dr. Magda Kiel. He denied suicidal ideations; however, he endorsed homicidal ideations. He also endorsed command auditory hallucinations and visual hallucinations. "The voices are telling me to kill somebody. I was in here last night and the security guard told me I had to leave. The voices was screaming in my head telling me to kill that security guard. The guard said something to me. It was weird because I focused me attention on him.The voices are telling me to do a lot of stuff. I just want the voices to stop. He is paranoid reporting "People are talking about me. It's not always bad but I know they are talking about me." His appearance is unkempt. He describes his mood "It goes up and down. I don't have a stable mood." He was agitated during interview process. He spoke with normal tone however his volume increased at intervals. He reports his appetite has decreased. He also reports he has sleep disturbances. He states he receives psychiatric services with [facility] with Howie Ill. " I have Schizoaffective Disorder Bipolar type so I see her." He reports his prescribed medication are Risperdal and Topamax. He was unable to recall the dosage. He reports he has PTSD. He reports he reside with his mother and father. He states he is from [place], [place]. He has been living in [place] for six months.He denies a support system. "My family tell me they are my support system but really they are not." He verbalized substance and alcohol use. He stated " I smoke a blunt yesterday. The guy told me he was going to spice up the blunt. He put cocaine in it. I'm not the only one who smoked it. He reports he has a pending court case for an assault on a family member. He is receptive to voluntary admission.    DISPOSITION: Discussed with Dr. Horace Porteous and on call psychiatric admission orders received.

## 2018-10-31 NOTE — ED Notes (Signed)
Patient laying in bed alert and oriented. He states that the last time he had issues with his legs it was during a high anxiety time for him. He states it

## 2018-10-31 NOTE — ED Notes (Signed)
TRANSFER - OUT REPORT:    Verbal report given to Primary Care Nurse (name) on Alan [name]  being transferred to Behavioral Medicine (unit) for routine progression of care       Report consisted of patient???s Situation, Background, Assessment and   Recommendations(SBAR).     Information from the following report(s) ED Summary was reviewed with the receiving nurse.    Lines:       Opportunity for questions and clarification was provided.      Patient transported with ER Nurse and Security. Patients belongs are in a bag, they consist of x2 knives, wallet, lighters x2, cell phone, and [place] ID.

## 2018-10-31 NOTE — Group Note (Signed)
IP BH GROUP DOCUMENTATION INDIVIDUAL                                                                          Group Therapy Note    Date: 10/31/2018    Group Start Time: 2015  Group End Time: 2045  Group Topic: Nursing    [facility] TRT[facility] 1    Neil Crouch Raquel James., RN    IP BH GROUP DOCUMENTATION GROUP    Group Therapy Note    Attendees: 5         Attendance: Attended        Interventions/techniques: Challenged and Informed    Follows Directions: Followed directions    Interactions: Interacted appropriately    Mental Status: Calm    Behavior/appearance: Cooperative    Goals Achieved: Able to listen to others          Raquel James. Neil Crouch, RN

## 2018-11-01 LAB — EKG, 12 LEAD, INITIAL
Atrial Rate: 75 {beats}/min
Calculated P Axis: 52 degrees
Calculated R Axis: 78 degrees
Calculated T Axis: 28 degrees
Diagnosis: NORMAL
P-R Interval: 172 ms
Q-T Interval: 380 ms
QRS Duration: 92 ms
QTC Calculation (Bezet): 424 ms
Ventricular Rate: 75 {beats}/min

## 2018-11-01 LAB — EKG 12-LEAD
Atrial Rate: 75 {beats}/min
Diagnosis: NORMAL
P Axis: 52 degrees
P-R Interval: 172 ms
Q-T Interval: 380 ms
QRS Duration: 92 ms
QTc Calculation (Bazett): 424 ms
R Axis: 78 degrees
T Axis: 28 degrees
Ventricular Rate: 75 {beats}/min

## 2018-11-01 MED ORDER — RISPERIDONE 1 MG TAB
1 mg | Freq: Two times a day (BID) | ORAL | Status: DC
Start: 2018-11-01 — End: 2018-11-03
  Administered 2018-11-02 – 2018-11-03 (×3): via ORAL

## 2018-11-01 MED FILL — HALOPERIDOL 5 MG TAB: 5 mg | ORAL | Qty: 1

## 2018-11-01 MED FILL — RISPERIDONE 1 MG TAB: 1 mg | ORAL | Qty: 1

## 2018-11-01 MED FILL — TOPIRAMATE 25 MG TAB: 25 mg | ORAL | Qty: 4

## 2018-11-01 MED FILL — HYDROXYZINE PAMOATE 50 MG CAP: 50 mg | ORAL | Qty: 1

## 2018-11-01 MED FILL — TRAZODONE 50 MG TAB: 50 mg | ORAL | Qty: 1

## 2018-11-01 NOTE — H&P (Signed)
Psychiatry History and Physical    Subjective:     Date of Evaluation:  11/01/2018    Reason for Referral:  Alan [name] was referred to the examiners from [facility] ED for HI and psychosis.    History of Presenting Problem: Alan [name] is a 28 yo M with PMH depression, schizophrenia who was admitted for psychosis. Today he reports SI, no plan and denies HI. He does report hearing voices. He has some fatigue, but denies any other physical complaints. Tox screen positive for THC and cocaine. EtOH negative. COVID negative.      Patient Active Problem List    Diagnosis Date Noted   ??? Schizophrenia, schizo-affective type, depressed ([place]) 10/31/2018     No past medical history on file.  No past surgical history on file.    No family history on file.   Social History     Tobacco Use   ??? Smoking status: Current Every Day Smoker     Packs/day: 1.00   Substance Use Topics   ??? Alcohol use: Yes     Frequency: Never     Comment: 6 pack of beer per day     Prior to Admission medications    Medication Sig Start Date End Date Taking? Authorizing Provider   risperiDONE (RisperDAL) 4 mg tablet Take 4 mg by mouth.    Other, Phys, MD     Allergies   Allergen Reactions   ??? Amoxicillin Unknown (comments)        Review of Systems - History obtained from chart review and the patient  General ROS: positive for  - fatigue  Psychological ROS: positive for - depression, hallucinations and suicidal ideation  Ophthalmic ROS: negative  ENT ROS: negative  Respiratory ROS: no cough, shortness of breath, or wheezing  Cardiovascular ROS: negative  Gastrointestinal ROS: negative  Musculoskeletal ROS: negative  Neurological ROS: negative  Dermatological ROS: negative      Objective:     Patient Vitals for the past 8 hrs:   BP Temp Pulse Resp   11/01/18 0750 111/61 98.1 ??F (36.7 ??C) 60 16       Mental Status exam: WNL except for    Sensorium  lethargic   Orientation person and place   Relations cooperative   Eye Contact poor   Appearance:   age appropriate   Motor Behavior:  within normal limits   Speech:  hypoverbal and soft   Vocabulary average   Thought Process: goal directed   Thought Content hallucinations   Suicidal ideations intention and no plan    Homicidal ideations no plan  and no intention   Mood:  depressed   Affect:  flat   Memory recent  adequate   Memory remote:  adequate   Concentration:  adequate   Abstraction:  concrete   Insight:  fair   Reliability fair   Judgment:  fair         Physical Exam:   Visit Vitals  BP 111/61 (BP 1 Location: Right arm, BP Patient Position: Sitting)   Pulse 60   Temp 98.1 ??F (36.7 ??C)   Resp 16   Ht 5\' 8"  (1.727 m)   Wt 81.6 kg (180 lb)   SpO2 99%   BMI 27.37 kg/m??     General appearance: fatigued, cooperative, no distress, appears stated age  Head: Normocephalic, without obvious abnormality, atraumatic  Eyes: negative  Ears: normal TM's and external ear canals AU  Nose: Nares normal. Septum midline. Mucosa normal.  No drainage or sinus tenderness.  Throat: Lips, mucosa, and tongue normal. Teeth and gums normal  Neck: supple, symmetrical, trachea midline and no adenopathy  Lungs: clear to auscultation bilaterally  Heart: regular rate and rhythm, S1, S2 normal, no murmur, click, rub or gallop  Abdomen: soft, non-tender.   Extremities: extremities normal, atraumatic, no cyanosis or edema  Skin: Skin color, texture, turgor normal. No rashes or lesions  Neurologic: Grossly normal        Impression:      Principal Problem:    Schizophrenia, schizo-affective type, depressed ([place]) (10/31/2018)          Plan:     Recommendations for Treatment/Conditions:  Psychiatric treatment recommended while in hospital  Admit to inpatient psych for depression, schizophrenia    Referral To:   Inpatient psychiatric care        [place], [place]   11/01/2018 11:50 AM

## 2018-11-01 NOTE — Behavioral Health Treatment Team (Deleted)
Patient participated  in all groups, he is very sad and depressed , he mentioned that he no longer wants to live. He talked about, his physical illness , he said he has trouble walking properly , " I just want to die, can you give me poison instead of food. " Staff listened and encouraged him. Staff asked who  in his life does he love, and he mentioned, his child, staff responded, then live for your daughter. He kept quiet and reflected, staff will continue to monitor patient for safety.

## 2018-11-01 NOTE — Behavioral Health Treatment Team (Signed)
Patient slept 7 hours during the shift will continue to monitor.

## 2018-11-01 NOTE — Progress Notes (Signed)
 Problem: Suicide  Goal: *STG: Remains safe in hospital  Description: Patient will not harm himself or others daily while in hospital.  Outcome: Progressing Towards Goal     Problem: Suicide  Goal: *STG: Attends activities and groups  Description: Patient will attend at least two groups daily while in hospital.  Outcome: Progressing Towards Goal     Problem: Suicide  Goal: *STG/LTG: Complies with medication therapy  Description: Patient will comply with medication regimen daily while in hospitalized.  Outcome: Progressing Towards Goal   Patient attending group activities.  Had long 1:1 with this writer expressing he is tired and can't do this any longer.  Continues to endorse suicidal ideation; contracts for safety on unit.  Continues to move about unit via wheelchair. Attends group activities with good participation. Compliant with medications. No behavioral issues.  Will continue to monitor and offer support.

## 2018-11-01 NOTE — Behavioral Health Treatment Team (Cosign Needed)
Patient participated  in all groups, he is very sad and depressed , he mentioned that he no longer wants to live. He talked about, his physical illness , he said he has trouble walking properly , " I just want to die, can you give me poison instead of food. " Staff listened and encouraged him. Staff asked who  in his life does he love, and he mentioned, his child, staff responded, then live for your daughter. He kept quiet and reflected, staff will continue to monitor patient for safety.

## 2018-11-01 NOTE — Other (Signed)
ART THERAPY GROUP PROGRESS NOTE    PATIENT SCHEDULED FOR GROUP AT: 9:00    ATTENDANCE: 1/4    PARTICIPATION LEVEL: Does not participate or gives up easily    ATTENTION LEVEL: Able to focus on task    FOCUS: Grounding    SYMBOLIC & THEMATIC CONTENT AS NOTED IN IMAGERY: He arrived late to group due to meeting with the doctor. He declined to participate in the art making process.

## 2018-11-01 NOTE — Progress Notes (Signed)
Problem: Suicide  Goal: *STG: Remains safe in hospital  Description: Patient will not harm himself or others daily while in hospital.  Outcome: Progressing Towards Goal     Problem: Suicide  Goal: *STG: Attends activities and groups  Description: Patient will attend at least two groups daily while in hospital.  Outcome: Progressing Towards Goal     Problem: Suicide  Goal: *STG/LTG: Complies with medication therapy  Description: Patient will comply with medication regimen daily while in hospitalized.  Outcome: Progressing Towards Goal   Patient attending group activities.  Had long 1:1 with this writer expressing he is tired and "can't do this any longer".  Continues to endorse suicidal ideation; contracts for safety on unit.  Continues to move about unit via wheelchair. Attends group activities with good participation. Compliant with medications. No behavioral issues.  Will continue to monitor and offer support.

## 2018-11-01 NOTE — Other (Signed)
BH Biopsychosocial Assessment    Current Level of Psychosocial Functioning     [x] Independent  [] Dependent  [] Minimal Assist      Comments:      Psychosocial High Risk Factors (check all that apply)      [] Unable to obtain meds                                                               [] Chronic illness/pain    [x] Substance abuse   [x] Lack of Family Support   [] Financial stress   [] Isolation   [] Inadequate Community Resources  [x] Suicide attempt(s)  [x] Not taking medications   [] Victim of crime   [] Developmental Delay  [] Unable to manage personal needs    [] Age 8 or older   []   Homeless  [] INo transportation   [] Readmission within 30 days  [] Unemployment  [x] Traumatic Event    Psychiatric Advanced Directive: None reported by patient.     Family to involve in treatment: Patient reports no family to involve in treatment.     Sexual Orientation: Patient reports he is heterosexual.     Patient Strengths: Unknown at this time.     Patient Barriers: patient reports he has no family support.     Opiate education provided: N/A    Safety plan: patient is able to contract for safety at this time.     [facility]/MH history: Patient reports he has a history of services with [facility].  SW contacted facility [phone] to gather pertinent information however, was unsuccessful.  SW will assist patient with obtaining ROI to receive history of treatment.      Plan of Care:  medication management, group/individual therapies, family meetings, psycho -education, treatment team meetings to assist with stabilization    Initial Discharge Plan:  SW will work towards discharge plan.     Clinical Summary:  History of Presenting Problem: Alan [name] is a 28 yo M with PMH depression, schizophrenia who was admitted for psychosis. Today he reports SI, no plan and denies HI. He does report hearing voices.  He has some fatigue, but denies any other physical complaints. Tox screen positive for THC and cocaine.    Humberto Leep, LCSW-E

## 2018-11-01 NOTE — Other (Signed)
OCCUPATIONAL THERAPY PROGRESS NOTE  Group Time:  1430  Attendance:    The patient attended 3/4 of group.  Participation:  The patient participated with minimal elaboration in the activity.  Initially declined participation, although, he was sitting near group.  Chose to participate last few minutes   Attention:  The patient was able to focus on the activity.  Interaction:  The patient acknowledges others or responds to questions,  with no spontaneous interaction.    Minimal participation or verbal interaction.  Presents as helpless/hopeless in answers.

## 2018-11-01 NOTE — H&P (Signed)
[place] HISTORY AND PHYSICAL    Name:  Alan [name], Alan [name]  MR#:   [id number]  DOB:  May 30, 1990  ACCOUNT #:  [id number]  ADMIT DATE:  10/30/2018    IDENTIFYING INFORMATION:  The patient is a 28 year old male, admitted to this facility on a voluntary basis on the above-mentioned date.    BASIS FOR ADMISSION:  The patient had been brought to the hospital's emergency room by friends who had called the police when the patient appeared to have become somewhat "unsteady" and apparently making very little sense.  When the patient was brought into the emergency room, he indicated that he was smoking cannabis with some friends and that he believes that one of them had placed something on the cannabis they were smoking "to the spice up the blunt."  The patient was unsure as to what was that the blunt was laced with; however, he apparently left the house and was found wandering outside.  Friends had apparently called the police, and so emergency services also were involved to the point in which he was brought to the attention of the emergency room.  It is not clear, however, as to how he was when emergency services arrived.  The patient indicated that he does not remember exactly what had happened, and the only thing that he remembers is that they were doing "a chest rub."    While in the emergency room, the patient was apparently considered to be medically cleared.  It appears that he was told that he was able to leave.  However, the patient then began to complain of increased difficulties with hearing voices telling him to hurt himself.  Describing himself as "wanting to die," the patient was presented to the physician on-call who kindly admitted the patient to my service and so the basis for this psychiatric admission.    PSYCHIATRIC HISTORY:  Difficult to obtain any clear history from the patient himself.  He indicated that he has been in [place] for 10 months  and that prior to that he was staying in the [place] area in [place] and being provided care by [employer] [facility]."  It appears that [facility] a large psychiatric outpatient provider, not clear if they have access to their own hospitals or not.  However, the patient had been provided treatment with them.  He was not able to let me know, with exception of his being prescribed risperidone and also topiramate, as to the doses of medications prescribed for him nor which other medications he was also being prescribed.  He apparently takes 3 or 4 "other medications" to help him with his psychiatric difficulties.  It appears that the patient's depression has lasted for a long time.  However, it is not clear as to which other facilities the patient has been hospitalized with.  Unable to maintain self-control, the patient again required to be admitted to the facility after disclosing his being actively suicidal and also describing the presence of psychotic symptoms.    MEDICAL HISTORY:  Described as negative and/or not having any past medical history on file.  It is to be mentioned that the patient described some type of difficulties with his ambulating.  When I met him here in the special treatment unit 1 program, he was seen on a wheelchair which was provided to him when he described not being able to walk.  He indicated that this is something that he has had off and on and that in the past he  required to have two spinal taps.  He was around 33 and residing in [place].  However, he was not able to give any other information about it.    When the patient was examined in the emergency room, his neurological exam was described as "patient drowsy but arousable, maintaining airway, oriented x3, however, he seems confused.  Falls back asleep when not being stimulated.  No focal neurological deficit.  With equal strength in all four extremities."    SURGICAL HISTORY:  Apparently negative.     ALLERGIES:  THE PATIENT IS DESCRIBED TO BE ALLERGIC TO AMOXICILLIN WHICH PRODUCES AN UNKNOWN TYPE OF REACTION.    LABORATORY DATA:  Multiple labs were performed at the time of the patient's evaluation in the emergency department.  They included a CBC with differential which had initially shown white blood cells as high as 26.3 with a deviation of the differential to the left, showing 95% neutrophils.  Absolute neutrophils also elevated to 24,900 per unit liter.  A repeat of the CBC showed to be several hours later almost normal with a white blood cell count reduced to 14.1, still showing some anemia with hemoglobin of 12.8, RBC of 4.39, however, normal hematocrit of 38.3.  Differential shows neutrophils at 74% with 20% lymphocytes.  Other studies included a blood chemistry that showed on 10/30/2018, sodium 137, potassium 3.3, chloride 104, blood sugar 97, BUN 9, creatinine 0.95, estimated GFR above 60 mL/min and normal liver function tests.  CK normal at 300.  Acetaminophen and salicylate levels below range.  Urine drug screen positive for cocaine and cannabis.  SARS-CoV-19 showed negative results from a nasopharyngeal sample.  An electrocardiogram showed a sinus rhythm with a ventricular rate response of 75 beats per minute, QT 380, QTc 424.    ALCOHOL AND DRUG HISTORY:  The patient denies the use of alcohol.  He indicated that cannabis is his drug of choice.  He denies using cocaine on a regular basis.  He indicated he did not know what this friend of his had laced his marijuana with.    PERSONAL HISTORY AND FAMILY HISTORY:  The patient is currently residing with his parents, something that he does not want to do.  He indicated that he does not have a key to the house; however, his sister who does not reside there and lives with her own family "does have a key to come in whenever she wants."  The patient mentioned that he would like to go back to [place], not clear as to why he came back to this area other  than perhaps requiring some type of support.  It is not clear if the patient is working.  He apparently has had a job before.  However, again, with his "neurological problems," he is unable to work regularly.    MENTAL STATUS EXAM:  This is a male who looks his stated age.  During this evaluation, there is no evidence of alcohol or any other type of drug-related signs of intoxication or withdrawal symptoms.  He is coherent, shows quality of continuity of associations without evidence of flight of ideas and/or pressure of speech.  The patient described, however, the presence of ego-dystonic auditory hallucinations which tell him to kill himself at times.  However, he did indicate that he will be able to talk to the staff if unable to control thoughts of self-harm.  He denies wanting to harm anyone else.  Cognition is intact.  Intellectual capacity appears to be average.  Insight and judgment showed both to be limited.  However, the patient knows what he is doing, knows the difference between right and wrong and knows what the consequences from his actions are.    CLINICAL IMPRESSION:  AXIS I:  Schizoaffective disorder, currently depressed subtype.  Cannabis use disorder, severe.  Brief psychotic episode, drug-induced.  AXIS II:  Deferred.  AXIS III:  History of difficulty walking due to lack of muscle strength, the specifics not clear.  History of allergy to amoxicillin.    TREATMENT PLAN:  1.  The patient was admitted to the special treatment unit 1, will be seen daily and will be referred to the groups within context of the program.  2.  The patient will be prescribed with a combination of risperidone which will be increased to 1 mg twice a day and maintained on his current dose of Topamax of 100 mg daily.  3.  The patient will be provided with p.r.n. medications depending upon his degree of agitation and also potential for control loss as currently ordered.   4.  He will have an inpatient case manager assigned to his care.  When the case was discussed with the treatment team today, we requested the patient's records to be obtained from [facility] in [place], [place].  Hopefully, they will be able to provide [place] with some information in regards to his not only psychiatric history but also his history of chronic neurological problems.    ESTIMATED LENGTH OF STAY AND PROGNOSIS:  ELOS is 5-7 days.  Prognosis will depend upon treatment response and treatment compliance.      Valetta Mole, MD, LFAPA      [id number]  D:  11/01/2018 10:32  T:  11/01/2018 14:24  JOB #:  [id number]

## 2018-11-01 NOTE — H&P (Signed)
Psychiatry History and Physical    Subjective:     Date of Evaluation:  11/01/2018    Reason for Referral:  Alan [name] was referred to the examiners from [facility] ED for HI and psychosis.    History of Presenting Problem: Alan [name] is a 28 yo M with PMH depression, schizophrenia who was admitted for psychosis. Today he reports SI, no plan and denies HI. He does report hearing voices. He has some fatigue, but denies any other physical complaints. Tox screen positive for THC and cocaine. EtOH negative. COVID negative.      Patient Active Problem List    Diagnosis Date Noted   ??? Schizophrenia, schizo-affective type, depressed (HCC) 10/31/2018     No past medical history on file.  No past surgical history on file.    No family history on file.   Social History     Tobacco Use   ??? Smoking status: Current Every Day Smoker     Packs/day: 1.00   Substance Use Topics   ??? Alcohol use: Yes     Frequency: Never     Comment: 6 pack of beer per day     Prior to Admission medications    Medication Sig Start Date End Date Taking? Authorizing Provider   risperiDONE (RisperDAL) 4 mg tablet Take 4 mg by mouth.    Other, Phys, MD     Allergies   Allergen Reactions   ??? Amoxicillin Unknown (comments)        Review of Systems - History obtained from chart review and the patient  General ROS: positive for  - fatigue  Psychological ROS: positive for - depression, hallucinations and suicidal ideation  Ophthalmic ROS: negative  ENT ROS: negative  Respiratory ROS: no cough, shortness of breath, or wheezing  Cardiovascular ROS: negative  Gastrointestinal ROS: negative  Musculoskeletal ROS: negative  Neurological ROS: negative  Dermatological ROS: negative      Objective:     Patient Vitals for the past 8 hrs:   BP Temp Pulse Resp   11/01/18 0750 111/61 98.1 ??F (36.7 ??C) 60 16       Mental Status exam: WNL except for    Sensorium  lethargic   Orientation person and place   Relations cooperative   Eye Contact poor    Appearance:  age appropriate   Motor Behavior:  within normal limits   Speech:  hypoverbal and soft   Vocabulary average   Thought Process: goal directed   Thought Content hallucinations   Suicidal ideations intention and no plan    Homicidal ideations no plan  and no intention   Mood:  depressed   Affect:  flat   Memory recent  adequate   Memory remote:  adequate   Concentration:  adequate   Abstraction:  concrete   Insight:  fair   Reliability fair   Judgment:  fair         Physical Exam:   Visit Vitals  BP 111/61 (BP 1 Location: Right arm, BP Patient Position: Sitting)   Pulse 60   Temp 98.1 ??F (36.7 ??C)   Resp 16   Ht 5\' 8"  (1.727 m)   Wt 81.6 kg (180 lb)   SpO2 99%   BMI 27.37 kg/m??     General appearance: fatigued, cooperative, no distress, appears stated age  Head: Normocephalic, without obvious abnormality, atraumatic  Eyes: negative  Ears: normal TM's and external ear canals AU  Nose: Nares normal. Septum midline. Mucosa normal.  No drainage or sinus tenderness.  Throat: Lips, mucosa, and tongue normal. Teeth and gums normal  Neck: supple, symmetrical, trachea midline and no adenopathy  Lungs: clear to auscultation bilaterally  Heart: regular rate and rhythm, S1, S2 normal, no murmur, click, rub or gallop  Abdomen: soft, non-tender.   Extremities: extremities normal, atraumatic, no cyanosis or edema  Skin: Skin color, texture, turgor normal. No rashes or lesions  Neurologic: Grossly normal        Impression:      Principal Problem:    Schizophrenia, schizo-affective type, depressed ([place]) (10/31/2018)          Plan:     Recommendations for Treatment/Conditions:  Psychiatric treatment recommended while in hospital  Admit to inpatient psych for depression, schizophrenia    Referral To:   Inpatient psychiatric care        [place], [place]   11/01/2018 11:50 AM

## 2018-11-01 NOTE — Group Note (Signed)
IP BH GROUP DOCUMENTATION INDIVIDUAL                                                                          Group Therapy Note    Date: 11/01/2018    Group Start Time: 2000  Group End Time: 2030  Group Topic: Nursing    [facility] 1 SPECIAL TRTMT 1    Neil Crouch Raquel James., RN    IP BH GROUP DOCUMENTATION GROUP    Group Therapy Note    Attendees: 4         Attendance: Did not attend group.    Raquel James. Neil Crouch, RN

## 2018-11-01 NOTE — Group Note (Addendum)
IP BH GROUP DOCUMENTATION INDIVIDUAL                                                                          Group Therapy Note    Date: 11/01/2018    Group Start Time: 0815  Group End Time: 0900  Group Topic: Nursing    [facility] SPECIAL TRTMT 1    Archie, Denny Peon, RN / Nursing Students    IP BH GROUP DOCUMENTATION GROUP    Group Therapy Note    Attendees: 4         Attendance: Attended    Patient's Goal:  Coping Skills    Interventions/techniques: Informed    Follows Directions: Followed directions    Interactions: Interacted appropriately    Mental Status: Calm    Behavior/appearance: Cooperative    Goals Achieved: Able to engage in interactions and Able to listen to others          Dionne Milo, RN

## 2018-11-02 DIAGNOSIS — F251 Schizoaffective disorder, depressive type: Secondary | ICD-10-CM

## 2018-11-02 LAB — LIPID PANEL
CHOL/HDL Ratio: 3 (ref 0–5.0)
Chol/HDL Ratio: 3 (ref 0–5.0)
Cholesterol, Total: 128 MG/DL (ref <200)
Cholesterol, total: 128 MG/DL (ref <200)
HDL Cholesterol: 43 MG/DL (ref 40–60)
HDL: 43 MG/DL (ref 40–60)
LDL Calculated: 69.8 MG/DL (ref 0–100)
LDL, calculated: 69.8 MG/DL (ref 0–100)
Triglyceride: 76 MG/DL (ref <150)
Triglycerides: 76 MG/DL (ref <150)
VLDL Cholesterol Calculated: 15.2 MG/DL
VLDL, calculated: 15.2 MG/DL

## 2018-11-02 LAB — HEMOGLOBIN A1C W/O EAG
Hemoglobin A1C: 5.5 % (ref 4.2–5.6)
Hemoglobin A1c: 5.5 % (ref 4.2–5.6)

## 2018-11-02 LAB — TSH 3RD GENERATION
TSH: 0.73 u[IU]/mL (ref 0.36–3.74)
TSH: 0.73 u[IU]/mL (ref 0.36–3.74)

## 2018-11-02 MED ORDER — PANTOPRAZOLE 40 MG TAB, DELAYED RELEASE
40 mg | Freq: Every day | ORAL | Status: DC
Start: 2018-11-02 — End: 2018-11-07
  Administered 2018-11-03 – 2018-11-07 (×5): via ORAL

## 2018-11-02 MED ORDER — ALUM-MAG HYDROXIDE-SIMETH 200 MG-200 MG-20 MG/5 ML ORAL SUSP
200-200-20 mg/5 mL | ORAL | Status: DC | PRN
Start: 2018-11-02 — End: 2018-11-07
  Administered 2018-11-02: 20:00:00 via ORAL

## 2018-11-02 MED FILL — MAG-AL PLUS 200 MG-200 MG-20 MG/5 ML ORAL SUSPENSION: 200-200-20 mg/5 mL | ORAL | Qty: 30

## 2018-11-02 MED FILL — HYDROXYZINE PAMOATE 50 MG CAP: 50 mg | ORAL | Qty: 1

## 2018-11-02 MED FILL — RISPERIDONE 1 MG TAB: 1 mg | ORAL | Qty: 1

## 2018-11-02 MED FILL — TOPIRAMATE 25 MG TAB: 25 mg | ORAL | Qty: 4

## 2018-11-02 MED FILL — TRAZODONE 50 MG TAB: 50 mg | ORAL | Qty: 1

## 2018-11-02 NOTE — Behavioral Health Treatment Team (Signed)
 Patient slept 7 hours during the shift will continue to monitor.

## 2018-11-02 NOTE — Progress Notes (Signed)
Problem: Suicide  Goal: *STG: Remains safe in hospital  Description: Patient will not harm himself or others daily while in hospital.  Outcome: Progressing Towards Goal  Goal: *STG/LTG: Complies with medication therapy  Description: Patient will comply with medication regimen daily while in hospitalized.  Outcome: Progressing Towards Goal   Pt. Isolate himself, guarded and depressed. He still have harmful thoughts to himself and hears voices. He is complaint with his medications. Will continue to monitor for safety and provise support.

## 2018-11-02 NOTE — Progress Notes (Signed)
Pt. C/o of heartburn and anxiety. Maalox and vistaril po given.Will continue to monitor for safety.

## 2018-11-02 NOTE — Progress Notes (Signed)
[facility]  Inpatient Progress Note     Date of Service: 11/02/18  Hospital Day: 2     Subjective/Interval History   11/02/18    Treatment Team Notes:  Notes reviewed and/or discussed and report that Alan [name] is is a patient with a history of a schizoaffective disorder recently admitted to our facility.  Attention is invited to the history and physical exam which is self-explanatory.      Patient interview: Alan [name] was interviewed by this Probation officer today.  No major changes described by the patient today when we met earlier this morning.  He continues to describe himself as being depressed and suicidal however remains able to assess to talk to her staff if unable to control thoughts of self-harm.  He described the presence of auditory hallucinations however he is emotional response to this voices that he is hearing is not the type of response that one may expect.  So while discussing the treatment team today questions raised about how truthful he is with symptoms that he is describing.      Objective     Visit Vitals  BP 101/64 (BP 1 Location: Right arm, BP Patient Position: Sitting)   Pulse 80   Temp 97.5 ??F (36.4 ??C)   Resp 16   Ht '5\' 8"'$  (1.727 m)   Wt 81.6 kg (180 lb)   SpO2 99%   BMI 27.37 kg/m??     Vitals are stable    Recent Results (from the past 24 hour(s))   HEMOGLOBIN A1C W/O EAG    Collection Time: 11/02/18  6:31 AM   Result Value Ref Range    Hemoglobin A1c 5.5 4.2 - 5.6 %   TSH 3RD GENERATION    Collection Time: 11/02/18  6:31 AM   Result Value Ref Range    TSH 0.73 0.36 - 3.74 uIU/mL   LIPID PANEL    Collection Time: 11/02/18  6:31 AM   Result Value Ref Range    LIPID PROFILE          Cholesterol, total 128 <200 MG/DL    Triglyceride 76 <150 MG/DL    HDL Cholesterol 43 40 - 60 MG/DL    LDL, calculated 69.8 0 - 100 MG/DL    VLDL, calculated 15.2 MG/DL    CHOL/HDL Ratio 3.0 0 - 5.0       Above results noticed    Mental Status Examination     Appearance/Hygiene 28  y.o. BLACK OR AFRICAN AMERICAN male  Hygiene: Limited   Behavior/Social Relatedness Appropriate withdrawn at times, however it appears that is easier for him to walk when he needs to.   Musculoskeletal Gait/Station: appropriate  Tone (flaccid, cogwheeling, spastic): not assessed  Psychomotor (hyperkinetic, hypokinetic): calm   Involuntary movements (tics, dyskinesias, akathisa, stereotypies): none   Speech   Rate, rhythm, volume, fluency and articulation are appropriate   Mood   depressed   Affect    mild lability noted   Thought Process Linear and goal directed   Thought Content and Perceptual Disturbances  continues to describe the presence of auditory hallucinations and continues to describe himself as being suicidal however able to contract for safety.   Sensorium and Cognition  Grossly intact   Insight  limited   Judgment  Limited        Assessment/Plan      Psychiatric Diagnoses:   Patient Active Problem List   Diagnosis Code   ??? Schizoaffective disorder, depressive type ([place]) F25.1  Medical Diagnoses: Same    Psychosocial and contextual factors: Same    Level of impairment/disability: Moderate    1.  Treatment with risperidone and topiramate were restarted.  Results from a lipid panel and A1c noted above.  TSH is also normal.  Review of the physical exam performed since admission showed his neurological examination being completely normal, including his motor strength.  This is obviously completely the opposite to quite the patient described his walking problems are.  Confrontation with clarification should follow.  2.  Reviewed instructions, risks, benefits and side effects of medications  3.  Disposition/Discharge Date: self-care/home, to be determined.    Valetta Mole, [facility]  Psychiatry

## 2018-11-02 NOTE — Other (Signed)
ART THERAPY GROUP PROGRESS NOTE    Group time: 9:00    The patient did not awaken/get up when called for group.

## 2018-11-02 NOTE — Progress Notes (Signed)
[facility]  Inpatient Progress Note     Date of Service: 11/02/18  Hospital Day: 2     Subjective/Interval History   11/02/18    Treatment Team Notes:  Notes reviewed and/or discussed and report that Alan [name] is is a patient with a history of a schizoaffective disorder recently admitted to our facility.  Attention is invited to the history and physical exam which is self-explanatory.      Patient interview: Alan [name] was interviewed by this Probation officer today.  No major changes described by the patient today when we met earlier this morning.  He continues to describe himself as being depressed and suicidal however remains able to assess to talk to her staff if unable to control thoughts of self-harm.  He described the presence of auditory hallucinations however he is emotional response to this voices that he is hearing is not the type of response that one may expect.  So while discussing the treatment team today questions raised about how truthful he is with symptoms that he is describing.      Objective     Visit Vitals  BP 101/64 (BP 1 Location: Right arm, BP Patient Position: Sitting)   Pulse 80   Temp 97.5 ??F (36.4 ??C)   Resp 16   Ht 5' 8"  (1.727 m)   Wt 81.6 kg (180 lb)   SpO2 99%   BMI 27.37 kg/m??     Vitals are stable    Recent Results (from the past 24 hour(s))   HEMOGLOBIN A1C W/O EAG    Collection Time: 11/02/18  6:31 AM   Result Value Ref Range    Hemoglobin A1c 5.5 4.2 - 5.6 %   TSH 3RD GENERATION    Collection Time: 11/02/18  6:31 AM   Result Value Ref Range    TSH 0.73 0.36 - 3.74 uIU/mL   LIPID PANEL    Collection Time: 11/02/18  6:31 AM   Result Value Ref Range    LIPID PROFILE          Cholesterol, total 128 <200 MG/DL    Triglyceride 76 <150 MG/DL    HDL Cholesterol 43 40 - 60 MG/DL    LDL, calculated 69.8 0 - 100 MG/DL    VLDL, calculated 15.2 MG/DL    CHOL/HDL Ratio 3.0 0 - 5.0       Above results noticed    Mental Status Examination      Appearance/Hygiene 28 y.o. BLACK OR AFR[place] AMER[place] male  Hygiene: Limited   Behavior/Social Relatedness Appropriate withdrawn at times, however it appears that is easier for him to walk when he needs to.   Musculoskeletal Gait/Station: appropriate  Tone (flaccid, cogwheeling, spastic): not assessed  Psychomotor (hyperkinetic, hypokinetic): calm   Involuntary movements (tics, dyskinesias, akathisa, stereotypies): none   Speech   Rate, rhythm, volume, fluency and articulation are appropriate   Mood   depressed   Affect    mild lability noted   Thought Process Linear and goal directed   Thought Content and Perceptual Disturbances  continues to describe the presence of auditory hallucinations and continues to describe himself as being suicidal however able to contract for safety.   Sensorium and Cognition  Grossly intact   Insight  limited   Judgment  Limited        Assessment/Plan      Psychiatric Diagnoses:   Patient Active Problem List   Diagnosis Code   ??? Schizoaffective disorder, depressive type ([place]) F25.1  Medical Diagnoses: Same    Psychosocial and contextual factors: Same    Level of impairment/disability: Moderate    1.  Treatment with risperidone and topiramate were restarted.  Results from a lipid panel and A1c noted above.  TSH is also normal.  Review of the physical exam performed since admission showed his neurological examination being completely normal, including his motor strength.  This is obviously completely the opposite to quite the patient described his walking problems are.  Confrontation with clarification should follow.  2.  Reviewed instructions, risks, benefits and side effects of medications  3.  Disposition/Discharge Date: self-care/home, to be determined.    Valetta Mole, [facility]  Psychiatry

## 2018-11-02 NOTE — Progress Notes (Signed)
Problem: Suicide  Goal: *STG: Remains safe in hospital  Description: Patient will not harm himself or others daily while in hospital.  Outcome: Progressing Towards Goal  Goal: *STG/LTG: Complies with medication therapy  Description: Patient will comply with medication regimen daily while in hospitalized.  Outcome: Progressing Towards Goal   Pt. Isolate himself, guarded and depressed. He still have harmful thoughts to himself and hears voices. He is complaint with his medications. Will continue to monitor for safety and provise support.

## 2018-11-02 NOTE — Group Note (Signed)
IP BH GROUP DOCUMENTATION INDIVIDUAL                                                                          Group Therapy Note    Date: 11/02/2018    Group Start Time: 1300  Group End Time: 1330  Group Topic: Nursing    [facility] SPECIAL TRTMT 1    Taguba, Joletta M, RN    IP BH GROUP DOCUMENTATION GROUP    Group Therapy Note    Attendees: 2         Attendance: Did not attend                  Joletta M Taguba, RN

## 2018-11-02 NOTE — Other (Signed)
OCCUPATIONAL THERAPY PROGRESS NOTE    Group time:1430    The patient declined group.  Went to get in bed upon recorder's arrival on unit.

## 2018-11-02 NOTE — Progress Notes (Signed)
Pt. C/o of heartburn and anxiety. Maalox and vistaril po given.Will continue to monitor for safety.

## 2018-11-02 NOTE — Other (Signed)
History: Alan [name] is a 28 yo M with PMH depression, schizophrenia who was admitted for psychosis. Today he reports SI, no plan and denies HI. He does report hearing voices. He has some fatigue, but denies any other physical complaints. Tox screen positive for THC and cocaine.    SW made contact with patient as he is isolated in bed.  Patients affect is flat and sad.  Patient continues to endorse suicidal ideations stating "I still want to kill myself".  Patient then addressed obtaining social security benefits ans well as housing.  SW informed patient of referral resources and will assist with information at discharge.  Patient reports he will return to his parents home in [place] upon discharge.  SW requested medical history information from previous hospitalization and will continue to assist as needed. SW will continue supportive counseling and will ensure safe and effective discharge.     Denny Levy, LCSW-E

## 2018-11-02 NOTE — Behavioral Health Treatment Team (Signed)
Patient slept 7 hours during the shift will continue to monitor.

## 2018-11-02 NOTE — Group Note (Signed)
IP BH GROUP DOCUMENTATION INDIVIDUAL                                                                          Group Therapy Note    Date: 11/02/2018    Group Start Time: 1745  Group End Time: 1815  Group Topic: Nursing    [facility] SPECIAL TRTMT 1    Taguba, Gilmore Laroche, RN    IP BH GROUP DOCUMENTATION GROUP    Group Therapy Note    Attendees: 3         Attendance: Did not attend                  Additional Notes:  Invited but decline  Invited but decline  Randa Lynn, RN

## 2018-11-03 MED ORDER — PERPHENAZINE 2 MG TAB
2 mg | Freq: Every evening | ORAL | Status: DC
Start: 2018-11-03 — End: 2018-11-06
  Administered 2018-11-04 – 2018-11-06 (×3): via ORAL

## 2018-11-03 MED FILL — TOPIRAMATE 25 MG TAB: 25 mg | ORAL | Qty: 4

## 2018-11-03 MED FILL — RISPERIDONE 1 MG TAB: 1 mg | ORAL | Qty: 1

## 2018-11-03 MED FILL — PANTOPRAZOLE 40 MG TAB, DELAYED RELEASE: 40 mg | ORAL | Qty: 1

## 2018-11-03 NOTE — Behavioral Health Treatment Team (Deleted)
 During this shift (3pm-11pm) pt has been out in the milieu for the entirety of this period socializing appropriately with peers and members of staff. Pt is compliant with unit rules, medication regimen and direction from staff. Pt actively participates in groups held by staff this period. Pt utilizing walker to ambulate the unit. Pt states, "I'm getting my strength back in my legs. My mood is getting better since being here. The medication is helping and talking with y'all helps too." Pt spoke with family on the pt phones this evening. Pt stated, "That call didn't go good at all. My folks are never supportive." Pt encouraged by staff he is taking the right steps to get himself together. Pt has not experienced a behavioral outburst during this shift. Staff will continue rounds monitoring pt for changes in behavior, location & safety.

## 2018-11-03 NOTE — Progress Notes (Signed)
Alan [name]  Inpatient Progress Note     Date of Service: 11/03/18  Hospital Day: 3     Subjective/Interval History   11/03/18    Treatment Team Notes:  Notes reviewed and/or discussed and report that Alan [name] is a patient with a history of a schizoaffective disorder depressed subtype currently describing the presence of increased sedation associated to treatment with risperidone.      Patient interview: Alan [name] was interviewed by this Probation officer today.  As a stated above the patient described himself as being rather drowsy with current dose of Risperdal 1 mg twice a day.  So he refused his risperidone this morning however is in agreement to take a different type of antipsychotic if prescribed with 1.  So we will discontinue treatment with risperidone and will start treatment with perphenazine 2 mg every night at bedtime dose to be increased depending upon tolerance and progress.      Objective     Visit Vitals  BP 101/64 (BP 1 Location: Right arm, BP Patient Position: Sitting)   Pulse 80   Temp 97.5 ??F (36.4 ??C)   Resp 16   Ht 5\' 8"  (1.727 m)   Wt 81.6 kg (180 lb)   SpO2 99%   BMI 27.37 kg/m??     Vitals noticed.    No results found for this or any previous visit (from the past 24 hour(s)).    Mental Status Examination     Appearance/Hygiene 28 y.o. BLACK OR AFR[place] AMER[place] male  Hygiene: Fair   Behavior/Social Relatedness  interacting with others however is still in a wheelchair.  Difficult to know how well is he able to walk or not.  Will ask for PT evaluation on Monday.   Musculoskeletal Gait/Station: appropriate  Tone (flaccid, cogwheeling, spastic): not assessed  Psychomotor (hyperkinetic, hypokinetic): calm   Involuntary movements (tics, dyskinesias, akathisa, stereotypies): none   Speech   Rate, rhythm, volume, fluency and articulation are appropriate   Mood   depressed   Affect    less labile   Thought Process Linear and goal directed   Thought Content and Perceptual  Disturbances  suicidal thoughts are less intense and less frequent.  The patient denies any current hallucinatory process however still somewhat paranoid and suspicious of others.   Sensorium and Cognition  Grossly intact   Insight  limited   Judgment  Limited     Assessment/Plan      Psychiatric Diagnoses:   Patient Active Problem List   Diagnosis Code   ??? Schizoaffective disorder, depressive type ([place]) F25.1       Medical Diagnoses: Same    Psychosocial and contextual factors: Same    Level of impairment/disability: Moderately severe    1.  As a stated above we will discontinue risperidone treatment will switch to perphenazine.  Please see medication orders.  2.  Reviewed instructions, risks, benefits and side effects of medications  3.  Disposition/Discharge Date: self-care/home, to be determined.    Valetta Mole, MD, [facility]  Psychiatry

## 2018-11-03 NOTE — Progress Notes (Signed)
Problem: Suicide  Goal: *STG: Remains safe in hospital  Description: Patient will not harm himself or others daily while in hospital.  Outcome: Progressing Towards Goal  Goal: *STG: Attends activities and groups  Description: Patient will attend at least two groups daily while in hospital.  Outcome: Progressing Towards Goal  Goal: *STG/LTG: Complies with medication therapy  Description: Patient will comply with medication regimen daily while in hospitalized.  Outcome: Progressing Towards Goal   Patient affect is flat, mood is depressed. Patient continue to have SI and AH. Patient has been sitting in day area for most of the day. Observed patient walking in hallway while pushing his w/c.

## 2018-11-03 NOTE — Behavioral Health Treatment Team (Signed)
Patient appeared to have slept 7 1/2 hrs.

## 2018-11-03 NOTE — Behavioral Health Treatment Team (Cosign Needed)
During this shift (3pm-11pm) pt has been out in the milieu for the entirety of this period socializing appropriately with peers and members of staff. Pt is compliant with unit rules, medication regimen and direction from staff. Pt actively participates in groups held by staff this period. Pt utilizing walker to ambulate the unit. Pt states, "I'm getting my strength back in my legs. My mood is getting better since being here. The medication is helping and talking with y'all helps too." Pt spoke with family on the pt phones this evening. Pt stated, "That call didn't go good at all. My folks are never supportive." Pt encouraged by staff he is taking the right steps to get himself together. Pt has not experienced a behavioral outburst during this shift. Staff will continue rounds monitoring pt for changes in behavior, location & safety.

## 2018-11-03 NOTE — Group Note (Signed)
IP BH GROUP DOCUMENTATION INDIVIDUAL                                                                          Group Therapy Note    Date: 11/03/2018    Group Start Time: 1115  Group End Time: 1130  Group Topic: Nursing    [facility] 1 SPECIAL TRTMT 1    Archie, Denny Peon, RN    IP BH GROUP DOCUMENTATION GROUP    Group Therapy Note    Attendees: 2       Attendance: Attended    Patient's Goal:  Medication Compliance    Interventions/techniques: Informed    Follows Directions: Followed directions    Interactions: Interacted appropriately    Mental Status: Calm    Behavior/appearance: Cooperative    Goals Achieved: Able to listen to others          Dionne Milo, RN

## 2018-11-03 NOTE — Progress Notes (Signed)
Problem: Suicide  Goal: *STG: Remains safe in hospital  Description: Patient will not harm himself or others daily while in hospital.  Outcome: Progressing Towards Goal  Goal: *STG: Attends activities and groups  Description: Patient will attend at least two groups daily while in hospital.  Outcome: Progressing Towards Goal  Goal: *STG/LTG: Complies with medication therapy  Description: Patient will comply with medication regimen daily while in hospitalized.  Outcome: Progressing Towards Goal   Patient affect is flat, mood is depressed. Patient continue to have SI and AH. Patient has been sitting in day area for most of the day. Observed patient walking in hallway while pushing his w/c.

## 2018-11-03 NOTE — Progress Notes (Signed)
[place]  Inpatient Progress Note     Date of Service: 11/03/18  Hospital Day: 3     Subjective/Interval History   11/03/18    Treatment Team Notes:  Notes reviewed and/or discussed and report that Alan [name] is a patient with a history of a schizoaffective disorder depressed subtype currently describing the presence of increased sedation associated to treatment with risperidone.      Patient interview: Alan [name] was interviewed by this Probation officer today.  As a stated above the patient described himself as being rather drowsy with current dose of Risperdal 1 mg twice a day.  So he refused his risperidone this morning however is in agreement to take a different type of antipsychotic if prescribed with 1.  So we will discontinue treatment with risperidone and will start treatment with perphenazine 2 mg every night at bedtime dose to be increased depending upon tolerance and progress.      Objective     Visit Vitals  BP 101/64 (BP 1 Location: Right arm, BP Patient Position: Sitting)   Pulse 80   Temp 97.5 ??F (36.4 ??C)   Resp 16   Ht 5\' 8"  (1.727 m)   Wt 81.6 kg (180 lb)   SpO2 99%   BMI 27.37 kg/m??     Vitals noticed.    No results found for this or any previous visit (from the past 24 hour(s)).    Mental Status Examination     Appearance/Hygiene 28 y.o. BLACK OR AFR[place] AMER[place] male  Hygiene: Fair   Behavior/Social Relatedness  interacting with others however is still in a wheelchair.  Difficult to know how well is he able to walk or not.  Will ask for PT evaluation on Monday.   Musculoskeletal Gait/Station: appropriate  Tone (flaccid, cogwheeling, spastic): not assessed  Psychomotor (hyperkinetic, hypokinetic): calm   Involuntary movements (tics, dyskinesias, akathisa, stereotypies): none   Speech   Rate, rhythm, volume, fluency and articulation are appropriate   Mood   depressed   Affect    less labile   Thought Process Linear and goal directed    Thought Content and Perceptual Disturbances  suicidal thoughts are less intense and less frequent.  The patient denies any current hallucinatory process however still somewhat paranoid and suspicious of others.   Sensorium and Cognition  Grossly intact   Insight  limited   Judgment  Limited     Assessment/Plan      Psychiatric Diagnoses:   Patient Active Problem List   Diagnosis Code   ??? Schizoaffective disorder, depressive type ([place]) F25.1       Medical Diagnoses: Same    Psychosocial and contextual factors: Same    Level of impairment/disability: Moderately severe    1.  As a stated above we will discontinue risperidone treatment will switch to perphenazine.  Please see medication orders.  2.  Reviewed instructions, risks, benefits and side effects of medications  3.  Disposition/Discharge Date: self-care/home, to be determined.    Valetta Mole, MD, [facility]  Psychiatry

## 2018-11-04 MED FILL — PANTOPRAZOLE 40 MG TAB, DELAYED RELEASE: 40 mg | ORAL | Qty: 1

## 2018-11-04 MED FILL — PERPHENAZINE 2 MG TAB: 2 mg | ORAL | Qty: 1

## 2018-11-04 NOTE — Progress Notes (Signed)
 Problem: Suicide  Goal: *STG: Remains safe in hospital  Description: Patient will not harm himself or others daily while in hospital.  Outcome: Progressing Towards Goal     Problem: Suicide  Goal: *STG: Attends activities and groups  Description: Patient will attend at least two groups daily while in hospital.  Outcome: Progressing Towards Goal     Problem: Suicide  Goal: *STG/LTG: Complies with medication therapy  Description: Patient will comply with medication regimen daily while in hospitalized.  Outcome: Progressing Towards Goal   Patient social and friendly with staff and peers.  Visible in dayroom reading his Bible and conversing with peers.  Mood improving; noted laughing and smiling during conversation.  States he is looking forward to treatment program and states he does not want his father involved with his progress at all.  Reports suicidal thoughts still come sometimes but it is better. Contracts for safety on the unit. Patient reports voices are better.  Moving about the unit using a walker for assist.  Attends group activities with good participation.  Compliant with medications.  Will continue to monitor.

## 2018-11-04 NOTE — Progress Notes (Signed)
[place]  Inpatient Progress Note     Date of Service: 11/04/18  Hospital Day: 4     Subjective/Interval History   11/04/18    Treatment Team Notes:  Notes reviewed and/or discussed and report that Celester Lech is a patient with a history of a schizoaffective disorder depressed type currently treatment compliant.      Patient interview: Alan [name] was interviewed by this Probation officer today.  The patient is ambulating better.  He is currently using a walker appropriately.  He described his still auditory hallucinations however they are less intense.  Most of his concerns related to his having a penile discharge.  We will be requesting our PA to see him.  Some studies requested, please see orders.      Objective     Visit Vitals  BP 112/65 (BP 1 Location: Right arm, BP Patient Position: Sitting)   Pulse 75   Temp 97.7 ??F (36.5 ??C)   Resp 18   Ht 5\' 8"  (1.727 m)   Wt 81.6 kg (180 lb)   SpO2 99%   BMI 27.37 kg/m??     Vitals are stable    No results found for this or any previous visit (from the past 24 hour(s)).    Mental Status Examination     Appearance/Hygiene 28 y.o. BLACK OR A[place] male  Hygiene somewhat improved   Behavior/Social Relatedness Appropriate   Musculoskeletal Gait/Station: appropriate  Tone (flaccid, cogwheeling, spastic): Able to use a walker today.  Psychomotor (hyperkinetic, hypokinetic): calm   Involuntary movements (tics, dyskinesias, akathisa, stereotypies): none   Speech   Rate, rhythm, volume, fluency and articulation are appropriate   Mood   less irritable, less depressed   Affect    still labile at times   Thought Process Linear and goal directed   Thought Content and Perceptual Disturbances  suicidal thoughts are less intense, the same as the patient's depression.  He is describing less auditory hallucinations and appears to be tolerating current prescription for perphenazine much better.  Specifically he is not oversedated.   Sensorium and  Cognition  Grossly intact   Insight  limited   Judgment  Limited        Assessment/Plan      Psychiatric Diagnoses:   Patient Active Problem List   Diagnosis Code   ??? Schizoaffective disorder, depressive type ([place]) F25.1       Medical Diagnoses: Penile discharge of undetermined etiology    Psychosocial and contextual factors: Same    Level of impairment/disability: Same    1.  Medications will continue to be the same.  Treatment for his penile discharge depending upon findings.  2.  Reviewed instructions, risks, benefits and side effects of medications  3.  Disposition/Discharge Date: self-care/home, to be determined.    Valetta Mole, MD, [facility]  Psychiatry

## 2018-11-04 NOTE — Progress Notes (Signed)
Urine collected and sent to lab.

## 2018-11-04 NOTE — Behavioral Health Treatment Team (Signed)
The patient was monitored for safety. Affect was calm, social and compliant the unit protocols. Pt has remained focused on his treatment goals. Pt did talk with his Dr. [name] did eat at all meals. Pt participated in his groups and activities. Staff will continue to monitor for safety and locations.

## 2018-11-04 NOTE — Behavioral Health Treatment Team (Signed)
Patient slept 7 hours.

## 2018-11-04 NOTE — Group Note (Signed)
IP BH GROUP DOCUMENTATION INDIVIDUAL                                                                          Group Therapy Note    Date: 11/04/2018    Group Start Time: 2000  Group End Time: 2015  Group Topic: Nursing    [facility] 1 SPECIAL TRTMT 1    Winn, Thressa M., RN    IP BH GROUP DOCUMENTATION GROUP    Group Therapy Note    Attendees: 4         Attendance: Attended        Interventions/techniques: Challenged and Informed    Follows Directions: Followed directions    Interactions: Interacted appropriately    Mental Status: Calm    Behavior/appearance: Attentive and Cooperative    Goals Achieved: Able to engage in interactions, Able to listen to others and Able to give feedback to another          Thressa M. Winn, RN

## 2018-11-04 NOTE — Behavioral Health Treatment Team (Signed)
The patient was monitored for safety. Affect was calm, social and compliant the unit protocols. Pt has remained focused on his treatment goals. Pt did talk with his Dr. Pt did eat at all meals. Pt participated in his groups and activities. Staff will continue to monitor for safety and locations.

## 2018-11-04 NOTE — Progress Notes (Signed)
Problem: Suicide  Goal: *STG: Remains safe in hospital  Description: Patient will not harm himself or others daily while in hospital.  Outcome: Progressing Towards Goal     Problem: Suicide  Goal: *STG: Attends activities and groups  Description: Patient will attend at least two groups daily while in hospital.  Outcome: Progressing Towards Goal     Problem: Suicide  Goal: *STG/LTG: Complies with medication therapy  Description: Patient will comply with medication regimen daily while in hospitalized.  Outcome: Progressing Towards Goal   Patient social and friendly with staff and peers.  Visible in dayroom reading his Bible and conversing with peers.  Mood improving; noted laughing and smiling during conversation.  States he is looking forward to treatment program and states he does not want his father involved with his progress at all.  Reports suicidal thoughts "still come sometimes but it is better". Contracts for safety on the unit. Patient reports voices are "better".  Moving about the unit using a walker for assist.  Attends group activities with good participation.  Compliant with medications.  Will continue to monitor.

## 2018-11-04 NOTE — Behavioral Health Treatment Team (Deleted)
Patient slept 5 1/2 hours had several outbursts during the night yelling cursing and calling out but not aggressive .

## 2018-11-04 NOTE — Progress Notes (Signed)
[place]  Inpatient Progress Note     Date of Service: 11/04/18  Hospital Day: 4     Subjective/Interval History   11/04/18    Treatment Team Notes:  Notes reviewed and/or discussed and report that Alan [name] is a patient with a history of a schizoaffective disorder depressed type currently treatment compliant.      Patient interview: Alan [name] was interviewed by this Probation officer today.  The patient is ambulating better.  He is currently using a walker appropriately.  He described his still auditory hallucinations however they are less intense.  Most of his concerns related to his having a penile discharge.  We will be requesting our PA to see him.  Some studies requested, please see orders.      Objective     Visit Vitals  BP 112/65 (BP 1 Location: Right arm, BP Patient Position: Sitting)   Pulse 75   Temp 97.7 ??F (36.5 ??C)   Resp 18   Ht 5\' 8"  (1.727 m)   Wt 81.6 kg (180 lb)   SpO2 99%   BMI 27.37 kg/m??     Vitals are stable    No results found for this or any previous visit (from the past 24 hour(s)).    Mental Status Examination     Appearance/Hygiene 28 y.o. BLACK OR AFR[place] male  Hygiene somewhat improved   Behavior/Social Relatedness Appropriate   Musculoskeletal Gait/Station: appropriate  Tone (flaccid, cogwheeling, spastic): Able to use a walker today.  Psychomotor (hyperkinetic, hypokinetic): calm   Involuntary movements (tics, dyskinesias, akathisa, stereotypies): none   Speech   Rate, rhythm, volume, fluency and articulation are appropriate   Mood   less irritable, less depressed   Affect    still labile at times   Thought Process Linear and goal directed   Thought Content and Perceptual Disturbances  suicidal thoughts are less intense, the same as the patient's depression.  He is describing less auditory hallucinations and appears to be tolerating current prescription for perphenazine much better.  Specifically he is not oversedated.    Sensorium and Cognition  Grossly intact   Insight  limited   Judgment  Limited        Assessment/Plan      Psychiatric Diagnoses:   Patient Active Problem List   Diagnosis Code   ??? Schizoaffective disorder, depressive type ([place]) F25.1       Medical Diagnoses: Penile discharge of undetermined etiology    Psychosocial and contextual factors: Same    Level of impairment/disability: Same    1.  Medications will continue to be the same.  Treatment for his penile discharge depending upon findings.  2.  Reviewed instructions, risks, benefits and side effects of medications  3.  Disposition/Discharge Date: self-care/home, to be determined.    Valetta Mole, MD, [facility]  Psychiatry

## 2018-11-04 NOTE — Group Note (Signed)
IP BH GROUP DOCUMENTATION INDIVIDUAL                                                                          Group Therapy Note    Date: 11/04/2018    Group Start Time: 1030  Group End Time: 1045  Group Topic: Nursing    [facility] 1 SPECIAL TRTMT 1    Archie, Kim M, RN    IP BH GROUP DOCUMENTATION GROUP    Group Therapy Note    Attendees: 4         Attendance: Attended    Patient's Goal:  Coping Skills    Interventions/techniques: Informed    Follows Directions: Followed directions    Interactions: Interacted appropriately    Mental Status: Calm    Behavior/appearance: Cooperative    Goals Achieved: Able to engage in interactions and Able to listen to others          Kim M Archie, RN

## 2018-11-05 MED FILL — TOPIRAMATE 25 MG TAB: 25 mg | ORAL | Qty: 4

## 2018-11-05 MED FILL — HYDROXYZINE PAMOATE 50 MG CAP: 50 mg | ORAL | Qty: 1

## 2018-11-05 MED FILL — PERPHENAZINE 2 MG TAB: 2 mg | ORAL | Qty: 1

## 2018-11-05 MED FILL — PANTOPRAZOLE 40 MG TAB, DELAYED RELEASE: 40 mg | ORAL | Qty: 1

## 2018-11-05 MED FILL — TRAZODONE 50 MG TAB: 50 mg | ORAL | Qty: 1

## 2018-11-05 NOTE — Progress Notes (Signed)
Problem: Suicide  Goal: *STG: Remains safe in hospital  Description: Patient will not harm himself or others daily while in hospital.  Outcome: Progressing Towards Goal  Goal: *STG/LTG: Complies with medication therapy  Description: Patient will comply with medication regimen daily while in hospitalized.  Outcome: Progressing Towards Goal     Problem: Falls - Risk of  Goal: *Absence of Falls  Description: Document Bridgette Habermann Fall Risk and appropriate interventions in the flowsheet.  Outcome: Progressing Towards Goal  Note: Fall Risk Interventions:  Mobility Interventions: PT Consult for mobility concerns, Utilize walker, cane, or other assistive device               Pt concerned most about penile discharge.  Pt informed that assessment wont be able to be completed until results of culture are completed.  Pt was receptive but irritable about d/t having made initial complaint regarding on Friday 10/09.  Pt offered no c/o si/hi or avh and was cooperative and sociable during shift.  Pt refused prns that were offered but otherwise took his scheduled medications. Will continue to support

## 2018-11-05 NOTE — Behavioral Health Treatment Team (Signed)
 Pt presents with dull affect, irritable at times. Pt coming up to desk stating, I need to see somebody. My thing is burning and I'm tired of waiting. You all about to see me act up soon! Staff were able to de-escalate pt and pt requested PRN Vistaril 50 mg. Pt is now resting quietly in his room. Pt denies SI/HI at this time and has been sociable with his peers on the unit. Will continue to monitor.

## 2018-11-05 NOTE — Behavioral Health Treatment Team (Signed)
Pt psychiatrist contacted regarding pts request to be seen by a physician r/t penile discharge.  Dr Fortino Sic reported that No treatment can be started until results of urine culture are in, results will not be complete until tomorrow.  Pt was notified of findings and he was understanding but still noticeably frustrated.  Prn medication was offered for agitation/irritation but he refused.  Will continue to monitor pt    Pt initially frustrated but when informed of the circumstances regarding his lab results he became more understanding.

## 2018-11-05 NOTE — Behavioral Health Treatment Team (Signed)
Patient slept 7 hours

## 2018-11-05 NOTE — Progress Notes (Signed)
[facility]  Inpatient Progress Note     Date of Service: 11/05/18  Hospital Day: 5     Subjective/Interval History   11/05/18    Treatment Team Notes:  Notes reviewed and/or discussed and report that Alan [name] is a patient with a prior history of schizoaffective disorder recently admitted to our facility attention being invited to the history and physical exam which is self-explanatory.      Patient interview: Alan [name] was interviewed by this Probation officer today.  The patient described beginning to feel better, his activity level has improved, however he is a still describing the presence of a penile discharge.  The order placed by the undersigned requesting our PAs to see the patient yesterday was apparently not noticed and so we are requesting 1 of them to please see the patient again today.  Otherwise requested the studies not back yet.  Will request a VDRL the same as HIV testing due to patient's history of sexual behaviors.  Otherwise treatment with perphenazine and Topamax are being well-tolerated.  The patient is beginning to show positive treatment response.      Objective     Visit Vitals  BP 99/65 (BP 1 Location: Right arm, BP Patient Position: Sitting)   Pulse 76   Temp 98.1 ??F (36.7 ??C)   Resp 18   Ht 5\' 8"  (1.727 m)   Wt 81.6 kg (180 lb)   SpO2 99%   BMI 27.37 kg/m??     Vitals are stable    No results found for this or any previous visit (from the past 24 hour(s)).    Mental Status Examination     Appearance/Hygiene 28 y.o. BLACK OR AFRICAN AMERICAN male  Hygiene: Improved but limited   Behavior/Social Relatedness  utilizing a walker now, the patient is apparently not having any problems with mobilizing himself.   Musculoskeletal Gait/Station: appropriate  Tone (flaccid, cogwheeling, spastic): not assessed  Psychomotor (hyperkinetic, hypokinetic): calm   Involuntary movements (tics, dyskinesias, akathisa, stereotypies): none   Speech   Rate, rhythm, volume, fluency  and articulation are appropriate   Mood   less depressed   Affect    less labile   Thought Process Linear and goal directed   Thought Content and Perceptual Disturbances  depression with suicidal ideations is improving, the patient also indicated that Trilafon is helping with his auditory hallucinations appropriately.    Sensorium and Cognition  Grossly intact   Insight  limited   Judgment  Limited        Assessment/Plan      Psychiatric Diagnoses:   Patient Active Problem List   Diagnosis Code   ??? Schizoaffective disorder, depressive type ([place]) F25.1       Medical Diagnoses: Penile discharge, undetermined etiology    Psychosocial and contextual factors: Same    Level of impairment/disability: Moderately severe    1.  No medication changes for now, staff is requesting information from outpatient providers in [facility].  Again 1 of our PAs hopefully will see the patient today.  2.  Reviewed instructions, risks, benefits and side effects of medications  3.  Disposition/Discharge Date: self-care/home, to be determined.    Valetta Mole, MD, [facility]  Psychiatry

## 2018-11-05 NOTE — Behavioral Health Treatment Team (Signed)
Pt is becoming increasingly frustrated d/t the fact that he is not being seen for his penile discharge.  Pt presented irritated. Pt stated "im doing everything I can to hold it together, I told them on Friday about this and I still havent been seen.  How long does it take!".  Pt was informed that this staff will do everything possible to ensure that he is seen.  Pt was slightly receptive and highly irritated.  Pt was offered prn medication for agitation but refused.  Crisis supervisor was notified. Will continue to monitor

## 2018-11-05 NOTE — Behavioral Health Treatment Team (Signed)
Patient slept 7 hours

## 2018-11-05 NOTE — Behavioral Health Treatment Team (Addendum)
Pt psychiatrist contacted regarding pts request to be seen by a physician r/t penile discharge.  Dr Vizcaino reported that No treatment can be started until results of urine culture are in, results will not be complete until tomorrow.  Pt was notified of findings and he was understanding but still noticeably frustrated.  Prn medication was offered for agitation/irritation but he refused.  Will continue to monitor pt    Pt initially frustrated but when informed of the circumstances regarding his lab results he became more understanding.

## 2018-11-05 NOTE — Other (Signed)
History: Alan [name] is a 28 yo M with PMH depression, schizophrenia who was admitted for psychosis. Today he reports SI, no plan and denies HI. He does report hearing voices. He has some fatigue, but denies any other physical complaints. Tox screen positive for THC and cocaine    SW assessment/Intervention:  SW made contact with patient as he engaged with select peer in the milieu.  Patient reports he is feeling better.  He reports he has been able to sleep well and has less feelings of sadness and depression.  Patient reports he has been medication complaint and needs to address other health issues with the doctor. SW addressed relapse prevention, empowerment and self efficacy   SW addressed Crisis Stabilization as an option upon discharge. Patient reports he will consider further treatment and will explore local options as well.  SW will continue supportive counseling and will assist with safe and effective counseling.    Denny Levy, LCSW-E

## 2018-11-05 NOTE — Behavioral Health Treatment Team (Addendum)
Pt is becoming increasingly frustrated d/t the fact that he is not being seen for his penile discharge.  Pt presented irritated. Pt stated "im doing everything I can to hold it together, I told them on Friday about this and I still havent been seen.  How long does it take!".  Pt was informed that this staff will do everything possible to ensure that he is seen.  Pt was slightly receptive and highly irritated.  Pt was offered prn medication for agitation but refused.  Crisis supervisor was notified. Will continue to monitor

## 2018-11-05 NOTE — Other (Signed)
OCCUPATIONAL THERAPY PROGRESS NOTE    Group time:1430    The patient declined group.  In bed.

## 2018-11-05 NOTE — Group Note (Signed)
IP BH GROUP DOCUMENTATION INDIVIDUAL                                                                          Group Therapy Note    Date: 11/05/2018    Group Start Time: 1000  Group End Time: 1015  Group Topic: Nursing    [facility] 1 SPECIAL TRTMT 1    [name], Alan    IP BH GROUP DOCUMENTATION GROUP    Group Therapy Note    Attendees: 4         Attendance: Attended    Patient's Goal:  Ice Breakers    Interventions/techniques: Informed    Follows Directions: Followed directions    Interactions: Interacted appropriately    Mental Status: Calm    Behavior/appearance: Cooperative    Goals Achieved: Able to give feedback to another        Alan [name]

## 2018-11-05 NOTE — Group Note (Signed)
IP BH GROUP DOCUMENTATION INDIVIDUAL                                                                          Group Therapy Note    Date: 11/05/2018    Group Start Time: 2000  Group End Time: 2030  Group Topic: Nursing    [facility] 1 SPECIAL TRTMT 1    Ilda Basset    IP BH GROUP DOCUMENTATION GROUP    Group Therapy Note    Attendees: 6         Attendance: Attended        Interventions/techniques: Informed    Follows Directions: Followed directions    Interactions: Interacted appropriately    Mental Status: Calm    Behavior/appearance: Cooperative    Goals Achieved: Able to listen to others          Ilda Basset

## 2018-11-05 NOTE — Other (Signed)
ART THERAPY GROUP PROGRESS NOTE    Group time:1345    The patient refused group despite encouragement

## 2018-11-05 NOTE — Group Note (Signed)
IP BH GROUP DOCUMENTATION INDIVIDUAL                                                                          Group Therapy Note    Date: 11/05/2018    Group Start Time: 1530  Group End Time: 1600  Group Topic: Social Work Group    [facility] 1 ADULT CHEM DEP    McLellan, Monica L    IP BH GROUP DOCUMENTATION GROUP    Group Therapy Note    Attendees: 5         Attendance: Attended    Interventions/techniques: Informed    Follows Directions: Followed directions    Interactions: Interacted appropriately    Mental Status: Calm    Behavior/appearance: Attentive    Goals Achieved: Able to self-disclose    Annitta Jersey

## 2018-11-05 NOTE — Behavioral Health Treatment Team (Signed)
Pt presents with dull affect, irritable at times. Pt coming up to desk stating, "I need to see somebody. My thing is burning and I'm tired of waiting. You all about to see me act up soon!" Staff were able to de-escalate pt and pt requested PRN Vistaril 50 mg. Pt is now resting quietly in his room. Pt denies SI/HI at this time and has been sociable with his peers on the unit. Will continue to monitor.

## 2018-11-05 NOTE — Progress Notes (Signed)
Problem: Suicide  Goal: *STG: Remains safe in hospital  Description: Patient will not harm himself or others daily while in hospital.  Outcome: Progressing Towards Goal  Goal: *STG/LTG: Complies with medication therapy  Description: Patient will comply with medication regimen daily while in hospitalized.  Outcome: Progressing Towards Goal     Problem: Falls - Risk of  Goal: *Absence of Falls  Description: Document Schmid Fall Risk and appropriate interventions in the flowsheet.  Outcome: Progressing Towards Goal  Note: Fall Risk Interventions:  Mobility Interventions: PT Consult for mobility concerns, Utilize walker, cane, or other assistive device               Pt concerned most about penile discharge.  Pt informed that assessment wont be able to be completed until results of culture are completed.  Pt was receptive but irritable about d/t having made initial complaint regarding on Friday 10/09.  Pt offered no c/o si/hi or avh and was cooperative and sociable during shift.  Pt refused prns that were offered but otherwise took his scheduled medications. Will continue to support

## 2018-11-05 NOTE — Progress Notes (Signed)
[facility]  Inpatient Progress Note     Date of Service: 11/05/18  Hospital Day: 5     Subjective/Interval History   11/05/18    Treatment Team Notes:  Notes reviewed and/or discussed and report that Alan [name] is a patient with a prior history of schizoaffective disorder recently admitted to our facility attention being invited to the history and physical exam which is self-explanatory.      Patient interview: Alan [name] was interviewed by this Probation officer today.  The patient described beginning to feel better, his activity level has improved, however he is a still describing the presence of a penile discharge.  The order placed by the undersigned requesting our PAs to see the patient yesterday was apparently not noticed and so we are requesting 1 of them to please see the patient again today.  Otherwise requested the studies not back yet.  Will request a VDRL the same as HIV testing due to patient's history of sexual behaviors.  Otherwise treatment with perphenazine and Topamax are being well-tolerated.  The patient is beginning to show positive treatment response.      Objective     Visit Vitals  BP 99/65 (BP 1 Location: Right arm, BP Patient Position: Sitting)   Pulse 76   Temp 98.1 ??F (36.7 ??C)   Resp 18   Ht 5\' 8"  (1.727 m)   Wt 81.6 kg (180 lb)   SpO2 99%   BMI 27.37 kg/m??     Vitals are stable    No results found for this or any previous visit (from the past 24 hour(s)).    Mental Status Examination     Appearance/Hygiene 28 y.o. BLACK OR AFR[place] AMER[place] male  Hygiene: Improved but limited   Behavior/Social Relatedness  utilizing a walker now, the patient is apparently not having any problems with mobilizing himself.   Musculoskeletal Gait/Station: appropriate  Tone (flaccid, cogwheeling, spastic): not assessed  Psychomotor (hyperkinetic, hypokinetic): calm   Involuntary movements (tics, dyskinesias, akathisa, stereotypies): none    Speech   Rate, rhythm, volume, fluency and articulation are appropriate   Mood   less depressed   Affect    less labile   Thought Process Linear and goal directed   Thought Content and Perceptual Disturbances  depression with suicidal ideations is improving, the patient also indicated that Trilafon is helping with his auditory hallucinations appropriately.    Sensorium and Cognition  Grossly intact   Insight  limited   Judgment  Limited        Assessment/Plan      Psychiatric Diagnoses:   Patient Active Problem List   Diagnosis Code   ??? Schizoaffective disorder, depressive type ([place]) F25.1       Medical Diagnoses: Penile discharge, undetermined etiology    Psychosocial and contextual factors: Same    Level of impairment/disability: Moderately severe    1.  No medication changes for now, staff is requesting information from outpatient providers in [facility].  Again 1 of our PAs hopefully will see the patient today.  2.  Reviewed instructions, risks, benefits and side effects of medications  3.  Disposition/Discharge Date: self-care/home, to be determined.    Valetta Mole, MD, [facility]  Psychiatry

## 2018-11-06 LAB — CT/NG/T.VAGINALIS AMPLIFICATION
C. trachomatis by NAA: NEGATIVE
CHLAMYDIA BY NAA, 183161: NEGATIVE
GONOCOCCUS BY NAA, 183162: POSITIVE — AB
N. gonorrhoeae by NAA: POSITIVE — AB
T. vaginalis by NAA: NEGATIVE
TRICH VAG BY NAA: NEGATIVE

## 2018-11-06 MED ORDER — LIDOCAINE (PF) 10 MG/ML (1 %) IJ SOLN
10 mg/mL (1 %) | Freq: Once | INTRAMUSCULAR | Status: AC
Start: 2018-11-06 — End: 2018-11-06
  Administered 2018-11-06: 17:00:00 via INTRAMUSCULAR

## 2018-11-06 MED ORDER — PERPHENAZINE 2 MG TAB
2 mg | Freq: Two times a day (BID) | ORAL | Status: DC
Start: 2018-11-06 — End: 2018-11-07
  Administered 2018-11-07 (×2): via ORAL

## 2018-11-06 MED ORDER — DIPHENHYDRAMINE 25 MG CAP
25 mg | ORAL | Status: DC | PRN
Start: 2018-11-06 — End: 2018-11-07
  Administered 2018-11-07: 05:00:00 via ORAL

## 2018-11-06 MED ORDER — AZITHROMYCIN 250 MG TAB
250 mg | ORAL | Status: AC
Start: 2018-11-06 — End: 2018-11-06
  Administered 2018-11-06: 15:00:00 via ORAL

## 2018-11-06 MED FILL — HYDROXYZINE PAMOATE 50 MG CAP: 50 mg | ORAL | Qty: 1

## 2018-11-06 MED FILL — AZITHROMYCIN 250 MG TAB: 250 mg | ORAL | Qty: 4

## 2018-11-06 MED FILL — AZITHROMYCIN 500 MG TAB: 500 mg | ORAL | Qty: 2

## 2018-11-06 MED FILL — PANTOPRAZOLE 40 MG TAB, DELAYED RELEASE: 40 mg | ORAL | Qty: 1

## 2018-11-06 MED FILL — CEFTRIAXONE 250 MG SOLUTION FOR INJECTION: 250 mg | INTRAMUSCULAR | Qty: 250

## 2018-11-06 NOTE — Progress Notes (Signed)
Patient has been in day area most of this shift. He is pleasant and cooperative today. Medication compliant. He denies SI , sometimes he states he experience hearing voices. His mood is calm and stable. Attending groups , social with peers.

## 2018-11-06 NOTE — Progress Notes (Signed)
[facility]  Inpatient Progress Note     Date of Service: 11/06/18  Hospital Day: 6     Subjective/Interval History   11/06/18    Treatment Team Notes:  Notes reviewed and/or discussed and report that Alan [name] is a patient with a history of schizoaffective disorder presented before the treatment team today.  The patient participated actively in the discussion.      Patient interview: Alan [name] was interviewed by this Probation officer today.  Alan [name] describes himself as feeling better.  He does describe being somewhat anxious about the possibility of his having a sexually transmitted disease despite a well being confirmed by having a positive result to testing, (gonorrhea).  The patient was informed about this test results privately and informed the need to proceed with antibiotic therapy.  He also was informed the need to talk to whoever his partner was prior admission that he needed to let this person now that he is positive for his STD.  The patient does not know who the person was with whom he had sexual relations with having him been "high" when he had them.  Otherwise treatment with a combination of Rocephin 250 mg IM and 1 dose of Zithromax of 1000 mg ordered.  The case was discussed with our pharmacy department since the patient is described to have an allergy to amoxicillin however it appears that the reaction was very limited.  Per my discussion with the pharmacy staff the possibility of his having any allergic reactions to the prescription for Rocephin is very low.  Otherwise while in the treatment team the patient described he is having still some auditory hallucinations during the daytime and requested his perphenazine dose to be increased to twice daily.  We will proceed to do so since he is tolerating his prescription for Trilafon very well.      Objective     Visit Vitals  BP 122/74 (BP 1 Location: Left arm, BP Patient Position: Sitting)   Pulse 73   Temp 97.8  ??F (36.6 ??C)   Resp 18   Ht 5\' 8"  (1.727 m)   Wt 81.6 kg (180 lb)   SpO2 99%   BMI 27.37 kg/m??     Vitals are stable    No results found for this or any previous visit (from the past 24 hour(s)).    Mental Status Examination     Appearance/Hygiene 28 y.o. BLACK OR AFR[place] AMER[place] male  Hygiene: Improving   Behavior/Social Relatedness Appropriate, able to walk normally without the help of a walker or a wheelchair   Musculoskeletal Gait/Station: appropriate  Tone (flaccid, cogwheeling, spastic): not assessed  Psychomotor (hyperkinetic, hypokinetic): calm   Involuntary movements (tics, dyskinesias, akathisa, stereotypies): none   Speech   Rate, rhythm, volume, fluency and articulation are appropriate   Mood   less labile   Affect    less irritable   Thought Process Linear and goal directed   Thought Content and Perceptual Disturbances Denies self-injurious behavior (SIB), suicidal ideation (SI), aggressive behavior or homicidal ideation (HI)  However is still describing the presence of auditory hallucinations.   Sensorium and Cognition  Grossly intact   Insight  improving   Judgment  improving        Assessment/Plan      Psychiatric Diagnoses:   Patient Active Problem List   Diagnosis Code   ??? Schizoaffective disorder, depressive type ([place]) F25.1       Medical Diagnoses: STD, (gonorrhea), treatment has been  prescribed.    Psychosocial and contextual factors: Same    Level of impairment/disability: Improved to moderate    1.  The patient is progressing along very well.  While in the treatment team we discussed the possibility of his being referred to the crisis stabilization unit in [place].  If there is a bed available there he could be discharge and transfer to that stabilization unit tomorrow.  The patient is in agreement.    2.  Reviewed instructions, risks, benefits and side effects of medications  3.  Disposition/Discharge Date: self-care/discharge suspected tomorrow to the crisis stabilization unit in  [place].    Alan Congress, MD, [facility]  [facility]  Psychiatry

## 2018-11-06 NOTE — Behavioral Health Treatment Team (Signed)
Patient has appeared to be in good spirits during this period.  He has spent time reading and talking to staff and peers. Speaking positively regarding admission to [facility] program.  Patient concerned regarding the care of his 96yr old son while he is there.  Compliant with medications. Ambulating without assistive device; gait remained steady.  Contracts for safety.

## 2018-11-06 NOTE — Progress Notes (Signed)
Problem: Suicide  Goal: *STG: Remains safe in hospital  Description: Patient will not harm himself or others daily while in hospital.  Outcome: Progressing Towards Goal     Problem: Suicide  Goal: *STG: Attends activities and groups  Description: Patient will attend at least two groups daily while in hospital.  Outcome: Progressing Towards Goal     Problem: Suicide  Goal: *STG/LTG: Complies with medication therapy  Description: Patient will comply with medication regimen daily while in hospitalized.  Outcome: Progressing Towards Goal   Patient denies +SI.  Pleasant and cooperative.  Sociable and friendly.  Med compliant.  Attends groups of choice.  Will continue to monitor.

## 2018-11-06 NOTE — Behavioral Health Treatment Team (Signed)
 Treatment team met -     Medical Director: _____present   Psychiatrist: __x___present   Charge nurse: __x___present   MSW: _x____present   Administrative Director: _____present   Nurse Manager: _____present   Student RNs: _____present   Medical Students: _____present   Art Therapist: _____present   Clinical Coordinator: __x___present    Occupational Therapist: _____present   Chaplain: _______ present  UR Case Manager _______ present  Crisis Supervisor_______present      Plan of care discussed and updated as appropriate. Patient was present for his treatment team meeting. Patient stated  life is worth living, I realize I have to stay on my medicine to have a level head . Stated he's sleeping better. Patient stated he's still hearing voices but they're better.  He been medication compliant and going to groups. Patient plan is to go to Crisis Stabilization  [place] when discharged.

## 2018-11-06 NOTE — Behavioral Health Treatment Team (Signed)
Patient medicated with Vistaril 50mg po for c/o anxiety.  Will continue to monitor.

## 2018-11-06 NOTE — Other (Signed)
OCCUPATIONAL THERAPY PROGRESS NOTE    Group time:1430    The patient declined group.  In bed.

## 2018-11-06 NOTE — Behavioral Health Treatment Team (Signed)
Patient has appeared to be in good spirits during this period.  He has spent time reading and talking to staff and peers. Speaking positively regarding admission to [place] program.  Patient concerned regarding the care of his 7yr old son while he is there.  Compliant with medications. Ambulating without assistive device; gait remained steady.  Contracts for safety.

## 2018-11-06 NOTE — Other (Signed)
??   11/06/18 2:00    Group Therapy   Group Social work   Attendance  did not attend   Number of participants 3   Time in 2:00   Time out 2:30   Total Time 30   Interventions/techniques Informed and encouraged   Interactions Declined was in his room

## 2018-11-06 NOTE — Other (Signed)
History:??Alan [name] is a 28 yo M with PMH depression, schizophrenia who was admitted for psychosis. Today he reports SI, no plan and denies HI. He does report hearing voices. He has some fatigue, but denies any other physical complaints. Tox screen positive for THC and cocaine.    SW made contact with patient as he engaged with select peers in the milieu.  Patients affect is flat and sad.  Patient remains actively involved in treatment.  Patient is engaged with groups and is able to process.  Patient reports he is feeling better and remains interested in further treatment.  Patient has been accepted for further treatment with Crisis Stabilization in [place].  Patient is scheduled for discharge tomorrow.  SW will continue to assist as needed.    Denny Levy, LCSW-E

## 2018-11-06 NOTE — Other (Signed)
ART THERAPY GROUP PROGRESS NOTE    PATIENT SCHEDULED FOR GROUP AT: 1015    ATTENDANCE: Full    PARTICIPATION LEVEL: Participates fully in the art process    ATTENTION LEVEL : Able to focus on task    FOCUS: Reality Orientation     SYMBOLIC & THEMATIC CONTENT AS NOTED IN IMAGERY: He was cheerful and presented with a bright affect. He was invested in the task at hand and actively participated in group discussion. He shared that he was working on "thinking more positively and starting new beginnings."

## 2018-11-06 NOTE — Group Note (Signed)
IP BH GROUP DOCUMENTATION INDIVIDUAL                                                                          Group Therapy Note    Date: 11/06/2018    Group Start Time: 1200  Group End Time: 1215  Group Topic: Nursing    [facility] 1 SPECIAL TRTMT 1    Archie, Kim M, RN    IP BH GROUP DOCUMENTATION GROUP    Group Therapy Note    Attendees: 4         Attendance: Attended    Patient's Goal:  Coping Skills    Interventions/techniques: Informed    Follows Directions: Followed directions    Interactions: Interacted appropriately    Mental Status: Calm    Behavior/appearance: Cooperative    Goals Achieved: Able to engage in interactions and Able to listen to others          Kim M Archie, RN

## 2018-11-06 NOTE — Progress Notes (Signed)
Problem: Suicide  Goal: *STG: Remains safe in hospital  Description: Patient will not harm himself or others daily while in hospital.  Outcome: Progressing Towards Goal     Problem: Suicide  Goal: *STG: Attends activities and groups  Description: Patient will attend at least two groups daily while in hospital.  Outcome: Progressing Towards Goal     Problem: Suicide  Goal: *STG/LTG: Complies with medication therapy  Description: Patient will comply with medication regimen daily while in hospitalized.  Outcome: Progressing Towards Goal   Patient denies +SI.  Pleasant and cooperative.  Sociable and friendly.  Med compliant.  Attends groups of choice.  Will continue to monitor.
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
## 2018-11-06 NOTE — Progress Notes (Signed)
Patient has been in day area most of this shift. He is pleasant and cooperative today. Medication compliant. He denies SI , sometimes he states he experience hearing voices. His mood is calm and stable. Attending groups , social with peers.

## 2018-11-06 NOTE — Behavioral Health Treatment Team (Signed)
Treatment team met -     Medical Director: _____present   Psychiatrist: __x___present   Charge nurse: __x___present   MSW: _x____present   Administrative Director: _____present   Nurse Manager: _____present   Student RNs: _____present   Medical Students: _____present   Art Therapist: _____present   Clinical Coordinator: __x___present    Occupational Therapist: _____present   Chaplain: _______ present  UR Case Manager _______ present  Crisis Supervisor_______present      Plan of care discussed and updated as appropriate. Patient was present for his treatment team meeting. Patient stated " life is worth living, I realize I have to stay on my medicine to have a level head ". Stated he's sleeping better. Patient stated he's still hearing voices but they're better.  He been medication compliant and going to groups. Patient plan is to go to Crisis Stabilization  [place] when discharged.

## 2018-11-06 NOTE — Group Note (Signed)
IP BH GROUP DOCUMENTATION INDIVIDUAL                                                                          Group Therapy Note    Date: 11/06/2018    Group Start Time: 1530  Group End Time: 1600  Group Topic: Nursing    [facility] 1 SPECIAL TRTMT 1    Neil Crouch Raquel James., RN    IP BH GROUP DOCUMENTATION GROUP    Group Therapy Note    Attendees: 3         Attendance: Attended        Interventions/techniques: Challenged and Informed    Follows Directions: Followed directions    Interactions: Interacted appropriately    Mental Status: Calm    Behavior/appearance: Attentive and Cooperative    Goals Achieved: Able to engage in interactions, Able to listen to others and Able to give feedback to another      Raquel James. Neil Crouch, RN

## 2018-11-07 LAB — RPR W/REFLEX TITER AND TREPONEMA ABS
RPR: NONREACTIVE
RPR: NONREACTIVE

## 2018-11-07 LAB — HIV 1/2 AG/AB, 4TH GENERATION,W RFLX CONFIRM: HIV 1/2 Interpretation: NONREACTIVE

## 2018-11-07 LAB — HIV 1/2 ANTIGEN/ANTIBODY, FOURTH GENERATION W/RFL: Interpretation: NONREACTIVE

## 2018-11-07 MED ORDER — TOPIRAMATE 100 MG TAB
100 mg | ORAL_TABLET | Freq: Every evening | ORAL | 0 refills | Status: AC
Start: 2018-11-07 — End: ?

## 2018-11-07 MED ORDER — HYDROXYZINE PAMOATE 50 MG CAP
50 mg | ORAL_CAPSULE | Freq: Three times a day (TID) | ORAL | 0 refills | Status: AC | PRN
Start: 2018-11-07 — End: ?

## 2018-11-07 MED ORDER — PANTOPRAZOLE 40 MG TAB, DELAYED RELEASE
40 mg | ORAL_TABLET | Freq: Every day | ORAL | 0 refills | Status: AC
Start: 2018-11-07 — End: ?

## 2018-11-07 MED ORDER — PERPHENAZINE 2 MG TAB
2 mg | ORAL_TABLET | Freq: Two times a day (BID) | ORAL | 0 refills | Status: AC
Start: 2018-11-07 — End: ?

## 2018-11-07 MED ORDER — TRAZODONE 50 MG TAB
50 mg | ORAL_TABLET | Freq: Every evening | ORAL | 0 refills | Status: AC | PRN
Start: 2018-11-07 — End: ?

## 2018-11-07 MED FILL — TRAZODONE 50 MG TAB: 50 mg | ORAL | Qty: 1

## 2018-11-07 MED FILL — DIPHENHYDRAMINE 25 MG CAP: 25 mg | ORAL | Qty: 2

## 2018-11-07 MED FILL — HYDROXYZINE PAMOATE 50 MG CAP: 50 mg | ORAL | Qty: 1

## 2018-11-07 MED FILL — TOPIRAMATE 25 MG TAB: 25 mg | ORAL | Qty: 4

## 2018-11-07 MED FILL — PANTOPRAZOLE 40 MG TAB, DELAYED RELEASE: 40 mg | ORAL | Qty: 1

## 2018-11-07 MED FILL — PERPHENAZINE 2 MG TAB: 2 mg | ORAL | Qty: 1

## 2018-11-07 NOTE — Progress Notes (Signed)
Patient received discharge instructions, verbalized understanding.

## 2018-11-07 NOTE — Behavioral Health Treatment Team (Signed)
Pt appeared to have slept for about 6 hours thus far during the night shift.

## 2018-11-07 NOTE — Progress Notes (Signed)
Patient received all personal items. He was escorted and d/c in care of case worker .

## 2018-11-07 NOTE — Behavioral Health Treatment Team (Signed)
Patient to desk requesting benadryl for itching and hydroxyzine for anxiety. Medication given to patient as prescribed. Patient denies SI/HI/AVH. Will continue to monitor for safety and changes.

## 2018-11-07 NOTE — Progress Notes (Signed)
 Chaplain conducted an Spirituality Group for Alan [name], who is a 27 y.o.,male.   Patient's Primary Language is: [place].   According to the patient's EMR Religious Affiliation is: No religion.     The reason the Patient came to the hospital is:   Patient Active Problem List    Diagnosis Date Noted   . Schizoaffective disorder, depressive type (HCC) 10/31/2018        Chaplain conducted Spirituality Group Ex #15 The Steps You Take; patient was one of 5 participants on Floor.    The Chaplain provided the following Interventions:  Initiated a relationship of care and support.   Listened empathically.  Discussed challenges of life situations and possible opportunities of being in treatment and spiritual care.  Offered prayer and assurance of continued prayer on patient's behalf.     The following outcomes were achieved:  Patient shared in group discussion life concerns.  Chaplain provided spiritual ideas in life coping skills.  Patient expressed gratitude for chaplains' visit and prayer.  Patient requested a Bible     Assessment:  There are no known further spiritual or religious issues which require Spiritual Care Services interventions at this time.     Plan:  Chaplains will continue to follow and will provide pastoral care on an as needed/requested basis.      [employer]  Spiritual Care   [phone]

## 2018-11-07 NOTE — Other (Signed)
History:??Alan [name] is a 28 yo M with PMH depression, schizophrenia who was admitted for psychosis. Today he reports SI, no plan and denies HI. He does report hearing voices. He has some fatigue, but denies any other physical complaints. Tox screen positive for THC and cocaine.    Patient prepared for discharge.  Patient will complete further services with Crisis Stabilization in [place].  SW completed family session with patients mother to address further treatment.  Mother is receptive of services and provided patient with clothing and hygiene products.  Patient is also receptive to services and is eager for treatment.  SW will continue to assist with completing discharge.      [employer], LCSW-E  ??

## 2018-11-07 NOTE — Behavioral Health Treatment Team (Signed)
Pt appeared to have slept for about 6 hours thus far during the night shift.

## 2018-11-07 NOTE — Group Note (Signed)
IP BH GROUP DOCUMENTATION INDIVIDUAL                                                                          Group Therapy Note    Date: 11/07/2018    Group Start Time: 1300  Group End Time: 1315  Group Topic: Nursing    [facility] 1 SPECIAL TRTMT 1    Archie, Denny Peon, RN    IP BH GROUP DOCUMENTATION GROUP    Group Therapy Note    Attendees:2         Attendance: Did not attend          Dionne Milo, RN

## 2018-11-07 NOTE — Behavioral Health Treatment Team (Signed)
Patient to desk requesting benadryl for itching and hydroxyzine for anxiety. Medication given to patient as prescribed. Patient denies SI/HI/AVH. Will continue to monitor for safety and changes.
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
## 2018-11-07 NOTE — Progress Notes (Signed)
Chaplain conducted an Spirituality Group for Alan [name], who is a 28 y.o.,male.   Patient's Primary Language is: English.   According to the patient's EMR Religious Affiliation is: No religion.     The reason the Patient came to the hospital is:   Patient Active Problem List    Diagnosis Date Noted   ??? Schizoaffective disorder, depressive type (HCC) 10/31/2018        Chaplain conducted Spirituality Group Ex #15 "The Steps You Take"; patient was one of 5 participants on Floor.    The Chaplain provided the following Interventions:  Initiated a relationship of care and support.   Listened empathically.  Discussed challenges of life situations and possible opportunities of being in treatment and spiritual care.  Offered prayer and assurance of continued prayer on patient's behalf.     The following outcomes were achieved:  Patient shared in group discussion life concerns.  Chaplain provided spiritual ideas in life coping skills.  Patient expressed gratitude for chaplains' visit and prayer.  Patient requested a Bible     Assessment:  There are no known further spiritual or religious issues which require Spiritual Care Services interventions at this time.     Plan:  Chaplains will continue to follow and will provide pastoral care on an as needed/requested basis.      Chaplain Meron Berhane-Finch  Spiritual Care   [phone]

## 2018-11-07 NOTE — Progress Notes (Signed)
Patient received all personal items. He was escorted and d/c in care of case worker .

## 2018-12-14 ENCOUNTER — Ambulatory Visit: Attending: Student in an Organized Health Care Education/Training Program
